# Patient Record
Sex: Female | Born: 1950 | Race: White | Hispanic: No | Marital: Married | Smoking: Current some day smoker
Health system: Southern US, Community
[De-identification: ages and names within clinical notes are randomized; demographics above are authoritative.]

## PROBLEM LIST (undated history)

## (undated) DIAGNOSIS — K589 Irritable bowel syndrome without diarrhea: Secondary | ICD-10-CM

## (undated) DIAGNOSIS — M545 Low back pain, unspecified: Secondary | ICD-10-CM

## (undated) DIAGNOSIS — N3944 Nocturnal enuresis: Secondary | ICD-10-CM

## (undated) DIAGNOSIS — IMO0002 Reserved for concepts with insufficient information to code with codable children: Secondary | ICD-10-CM

## (undated) DIAGNOSIS — R7303 Prediabetes: Secondary | ICD-10-CM

## (undated) DIAGNOSIS — N35919 Unspecified urethral stricture, male, unspecified site: Secondary | ICD-10-CM

## (undated) DIAGNOSIS — C4490 Unspecified malignant neoplasm of skin, unspecified: Secondary | ICD-10-CM

## (undated) DIAGNOSIS — R102 Pelvic and perineal pain: Secondary | ICD-10-CM

## (undated) DIAGNOSIS — R351 Nocturia: Secondary | ICD-10-CM

## (undated) DIAGNOSIS — N200 Calculus of kidney: Secondary | ICD-10-CM

## (undated) DIAGNOSIS — N3281 Overactive bladder: Secondary | ICD-10-CM

## (undated) DIAGNOSIS — N952 Postmenopausal atrophic vaginitis: Secondary | ICD-10-CM

## (undated) DIAGNOSIS — N9089 Other specified noninflammatory disorders of vulva and perineum: Secondary | ICD-10-CM

## (undated) DIAGNOSIS — Z87442 Personal history of urinary calculi: Secondary | ICD-10-CM

## (undated) DIAGNOSIS — R350 Frequency of micturition: Secondary | ICD-10-CM

## (undated) HISTORY — DX: Nocturnal enuresis: N39.44

## (undated) HISTORY — DX: Low back pain: M54.5

## (undated) HISTORY — DX: Reserved for concepts with insufficient information to code with codable children: IMO0002

## (undated) HISTORY — DX: Other specified noninflammatory disorders of vulva and perineum: N90.89

## (undated) HISTORY — DX: Unspecified malignant neoplasm of skin, unspecified: C44.90

## (undated) HISTORY — DX: Nocturia: R35.1

## (undated) HISTORY — DX: Irritable bowel syndrome, unspecified: K58.9

## (undated) HISTORY — PX: SKIN CANCER EXCISION: SHX779

## (undated) HISTORY — DX: Overactive bladder: N32.81

## (undated) HISTORY — DX: Postmenopausal atrophic vaginitis: N95.2

## (undated) HISTORY — DX: Calculus of kidney: N20.0

## (undated) HISTORY — PX: COLONOSCOPY: SHX174

## (undated) HISTORY — DX: Low back pain, unspecified: M54.50

## (undated) HISTORY — DX: Unspecified urethral stricture, male, unspecified site: N35.919

## (undated) HISTORY — DX: Frequency of micturition: R35.0

## (undated) HISTORY — DX: Pelvic and perineal pain: R10.2

---

## 2005-08-01 ENCOUNTER — Ambulatory Visit: Payer: Self-pay

## 2005-10-04 ENCOUNTER — Ambulatory Visit: Payer: Self-pay | Admitting: Family Medicine

## 2006-10-12 ENCOUNTER — Ambulatory Visit: Payer: Self-pay | Admitting: Family Medicine

## 2007-10-15 ENCOUNTER — Ambulatory Visit: Payer: Self-pay | Admitting: Family Medicine

## 2008-01-21 ENCOUNTER — Ambulatory Visit: Payer: Self-pay | Admitting: Unknown Physician Specialty

## 2008-10-23 ENCOUNTER — Ambulatory Visit: Payer: Self-pay | Admitting: Family Medicine

## 2009-12-14 ENCOUNTER — Ambulatory Visit: Payer: Self-pay | Admitting: Family Medicine

## 2010-01-29 ENCOUNTER — Ambulatory Visit: Payer: Self-pay | Admitting: Unknown Physician Specialty

## 2010-01-29 IMAGING — US TRANSABDOMINAL ULTRASOUND OF PELVIS
1 series · 17 of 25 positions shown · non-contrast
Comparison: none

REASON FOR EXAM: IBS
COMMENTS:

[Series 1: transabdominal ultrasound of pelvis · 17 of 25 slices shown]
[im 1/25]
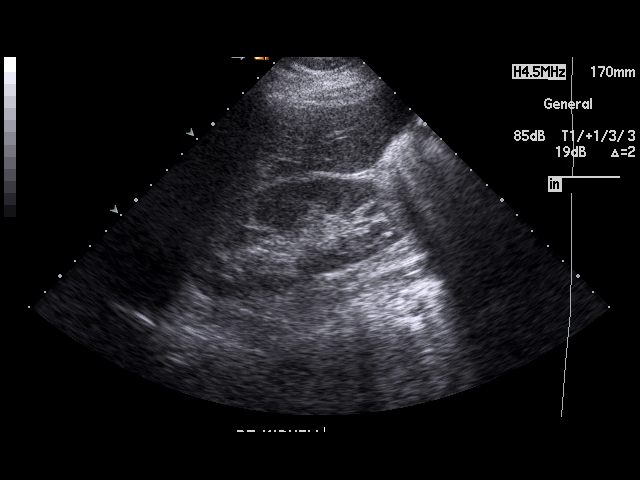
[im 3/25]
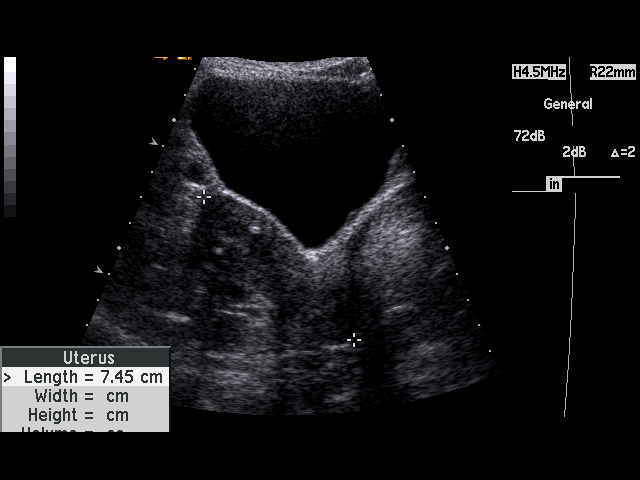
[im 4/25]
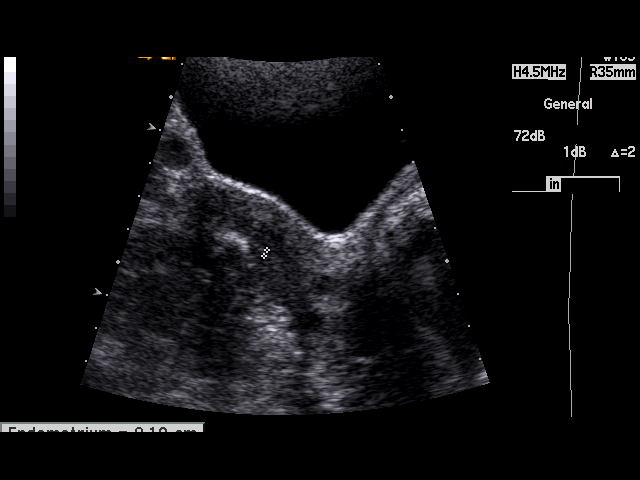
[im 6/25]
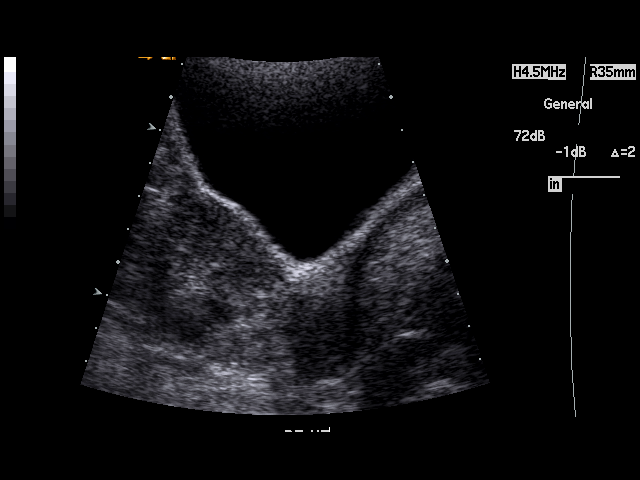
[im 7/25]
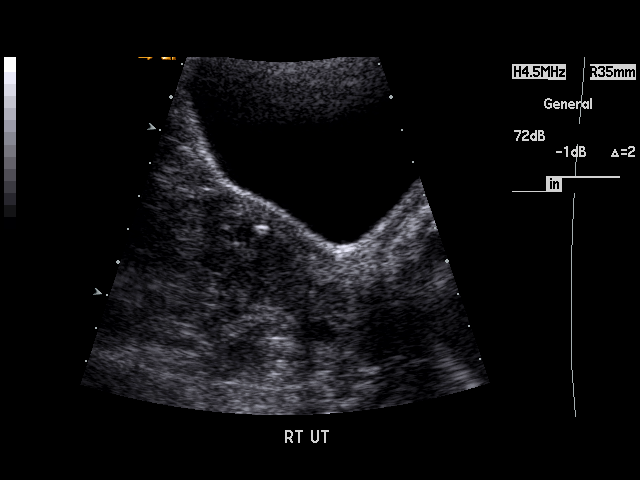
[im 9/25]
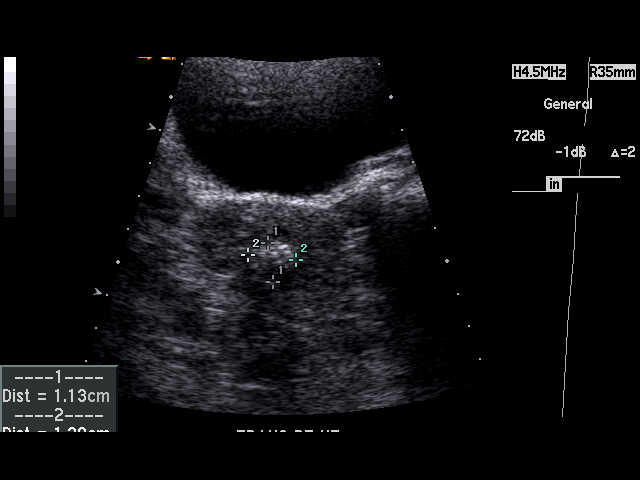
[im 10/25]
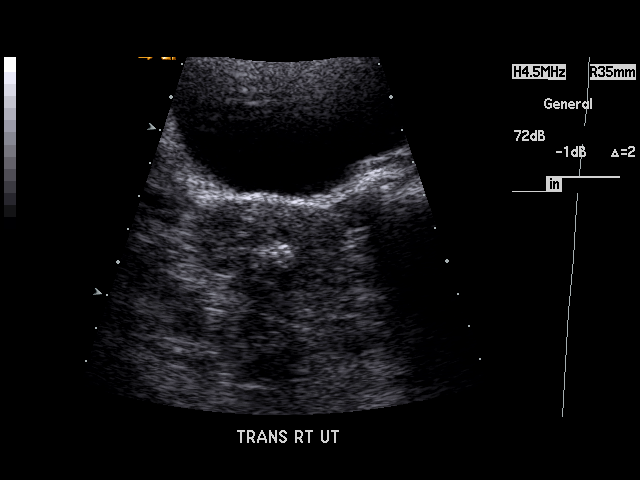
[im 12/25]
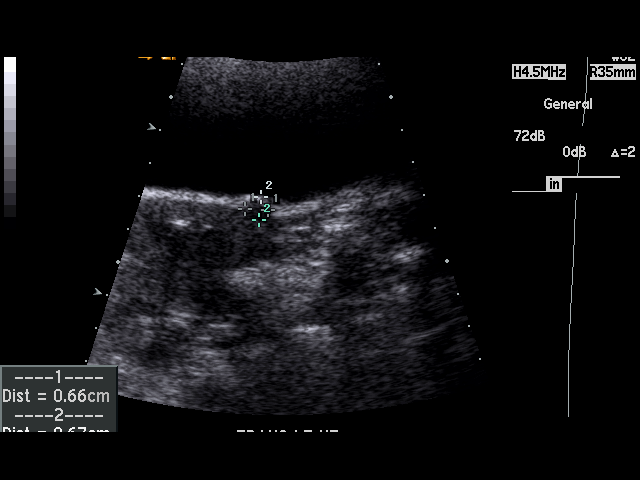
[im 13/25]
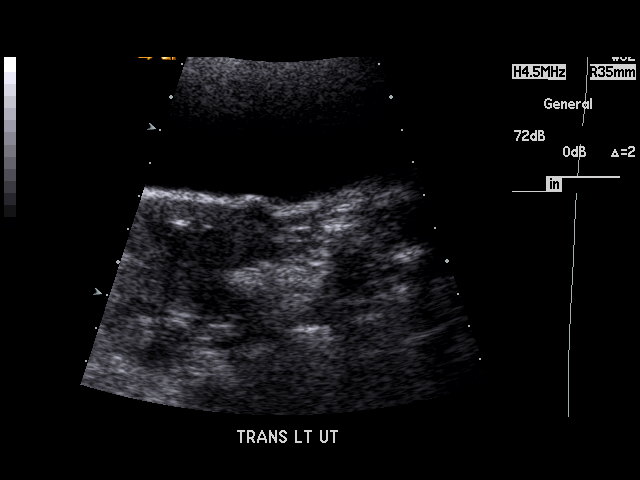
[im 14/25]
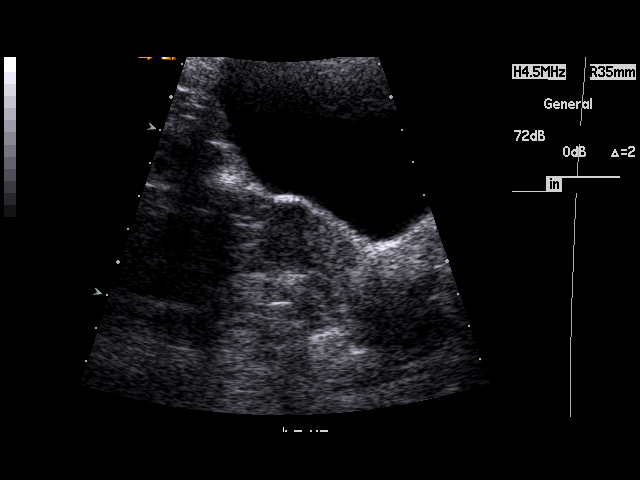
[im 16/25]
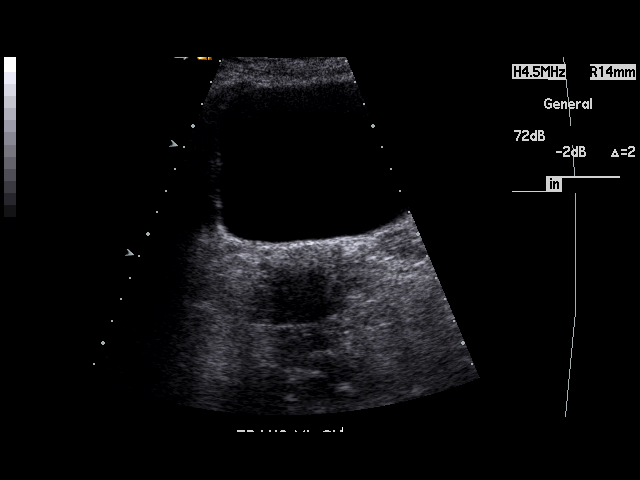
[im 17/25]
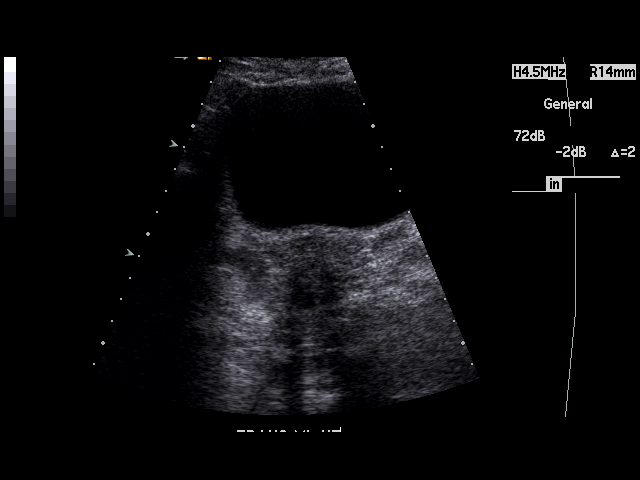
[im 19/25]
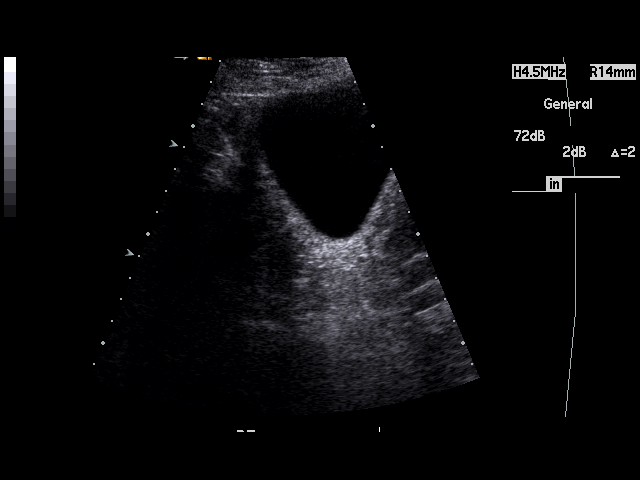
[im 20/25]
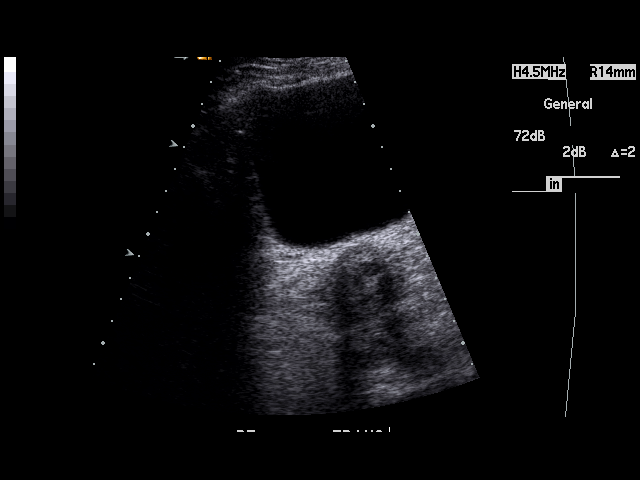
[im 22/25]
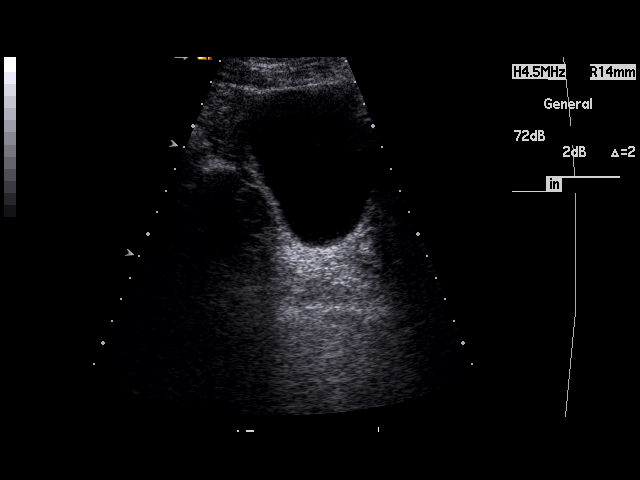
[im 23/25]
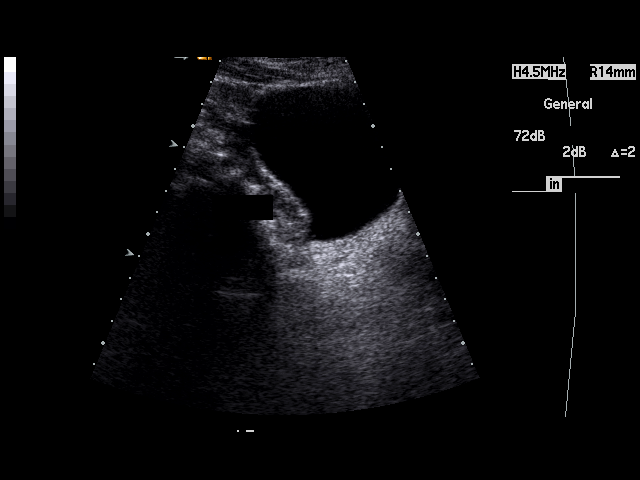
[im 25/25]
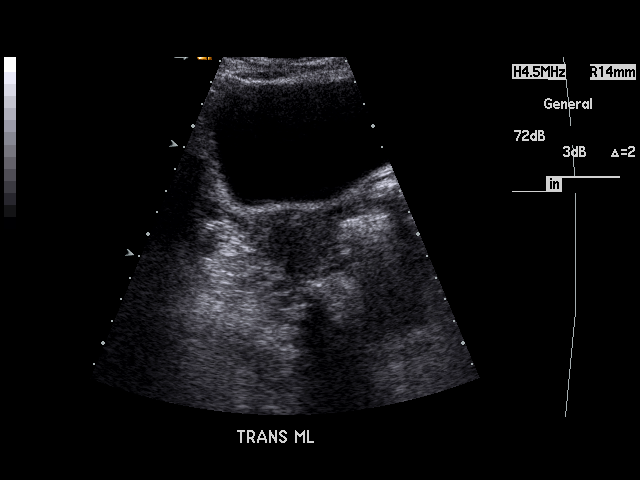

[17 of 25 positions shown; findings below may reference images not displayed]

PROCEDURE:     FRANCISCO J - FRANCISCO J PELVIS NON-OB  - [DATE] [DATE]

RESULT:     Transabdominal Pelvic Ultrasound was performed. The uterus
measures 7.45 cm x 4.21 cm x 2.91 cm. The endometrium measures 1.9 mm in
thickness. There are noted two, small uterine masses consistent with uterine
fibroids containing calcification. The larger is located posteriorly in the
fundal area and measures 1.39 cm at maximum diameter. The smaller is
anteriorly located in the fundus and measures 8.8 mm at maximum diameter.
The right and left ovaries are not seen and apparently are atrophic or are
obscured. No abnormal adnexal masses are seen. There is no free fluid noted
in the pelvis. The kidneys show no hydronephrosis. There is no ascites.
IMPRESSION: 1.  There are noted two, calcified uterine fibroids. These were also present
on a prior exam in [F8]. The prior exam is not available for review but
based on the report, the fibroids apparently are now smaller.
2.  No thickening of the uterine endometrium is seen.
3.  No abnormal adnexal masses are noted.
4.  Neither ovary is identified sonographically.
5.  There is no ascites.

## 2011-05-05 ENCOUNTER — Ambulatory Visit: Payer: Self-pay | Admitting: Family Medicine

## 2012-05-08 ENCOUNTER — Ambulatory Visit: Payer: Self-pay | Admitting: Family Medicine

## 2012-05-08 IMAGING — MG MM DIGITAL SCREENING BILAT W/ CAD
1 series · 4 of 4 positions shown · non-contrast
Comparison: none

REASON FOR EXAM: SCR MAMMO NO ORDER
COMMENTS:

[R CC · right · 4 of 4 slices shown]
[im 1/4]
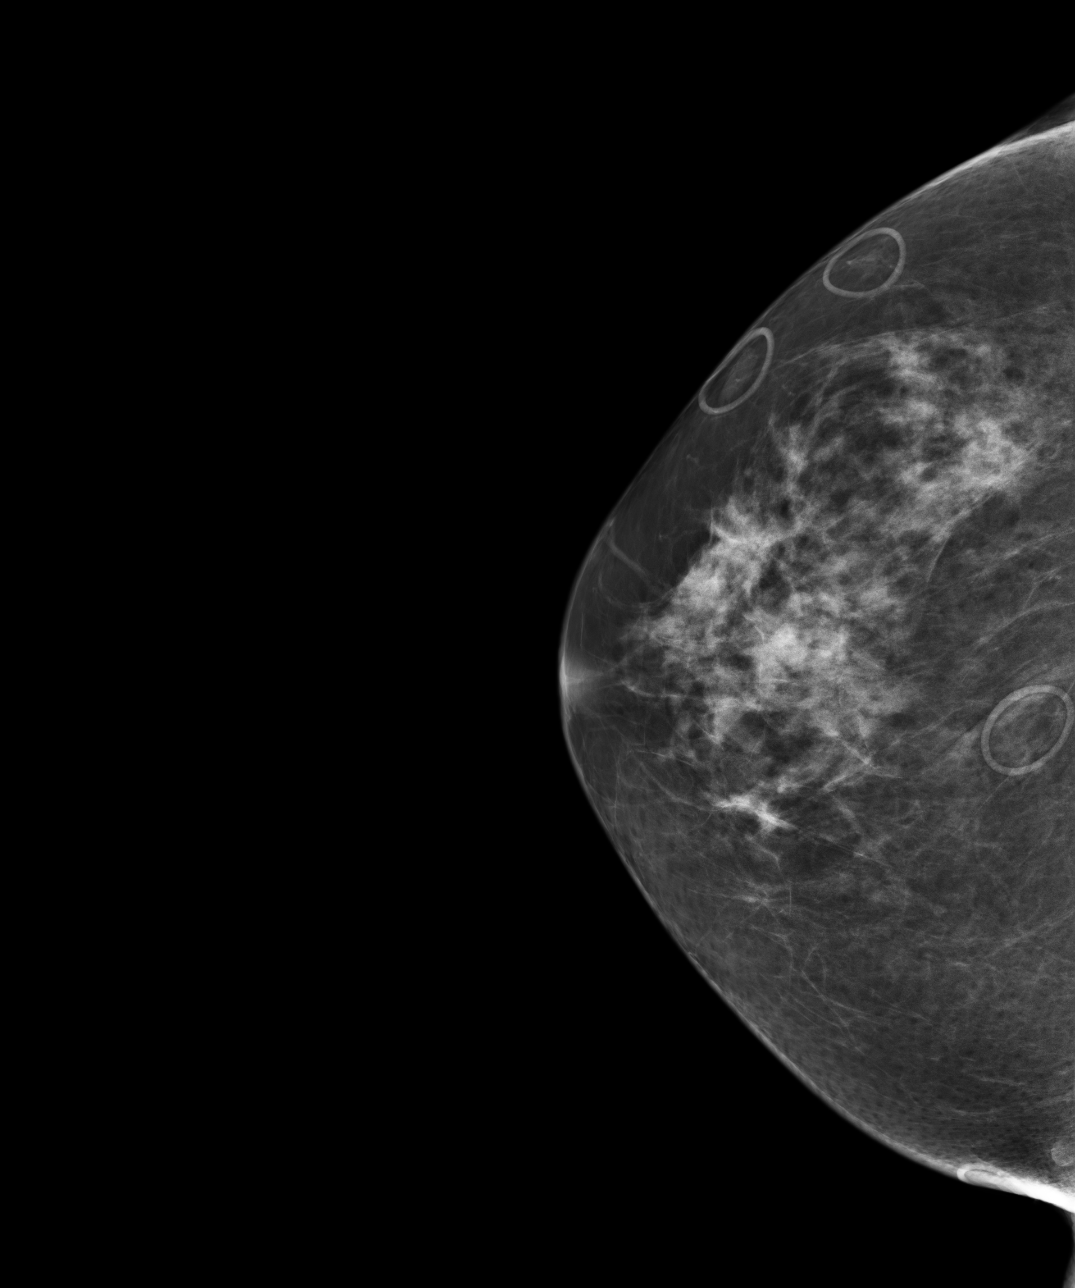
[im 2/4]
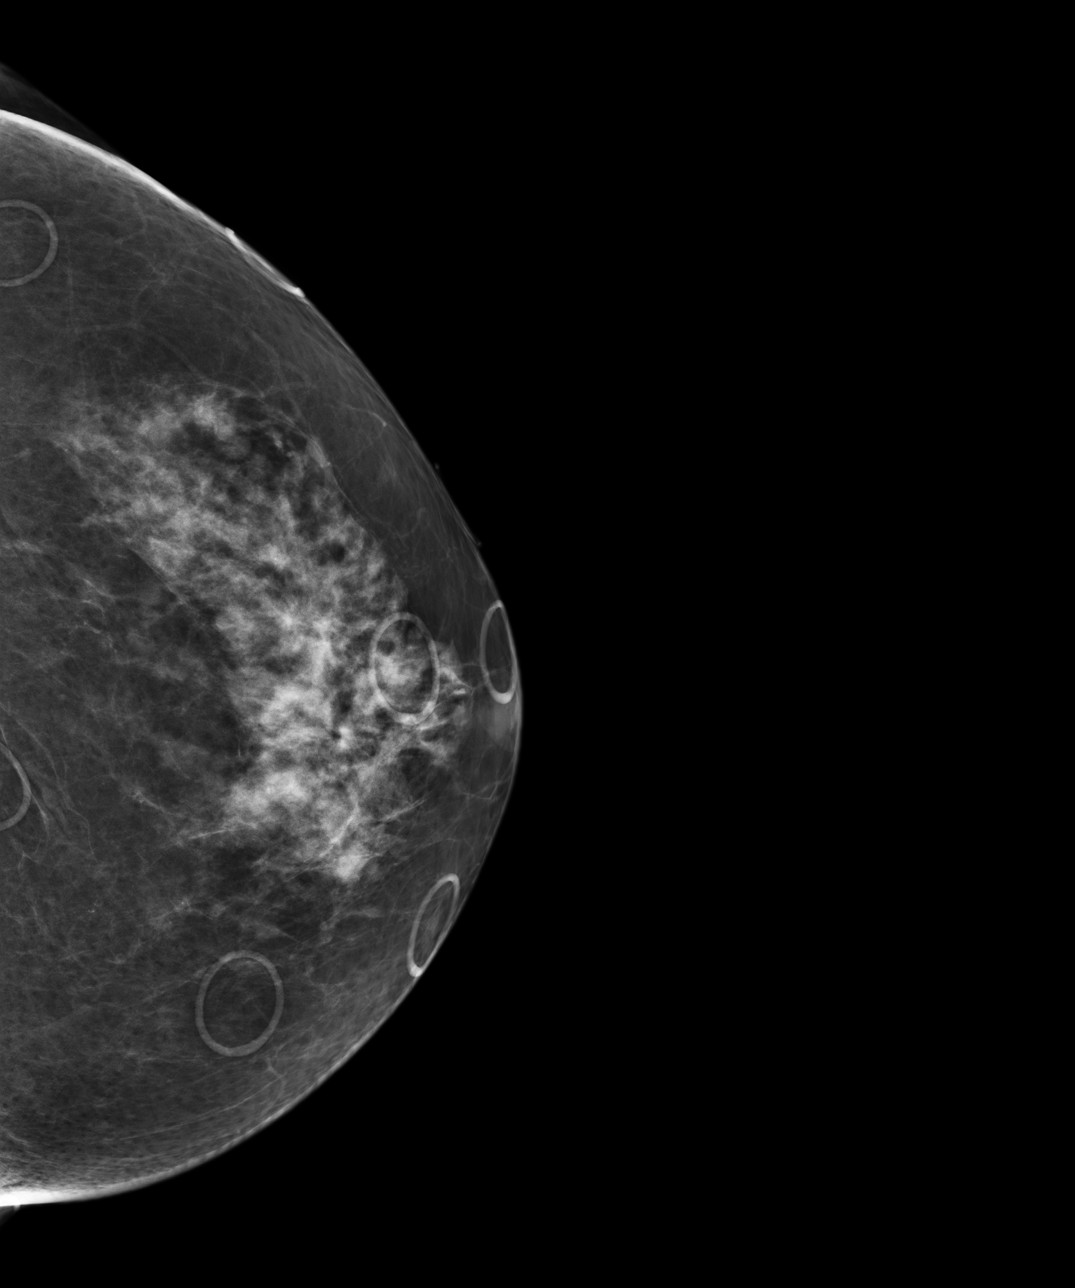
[im 3/4]
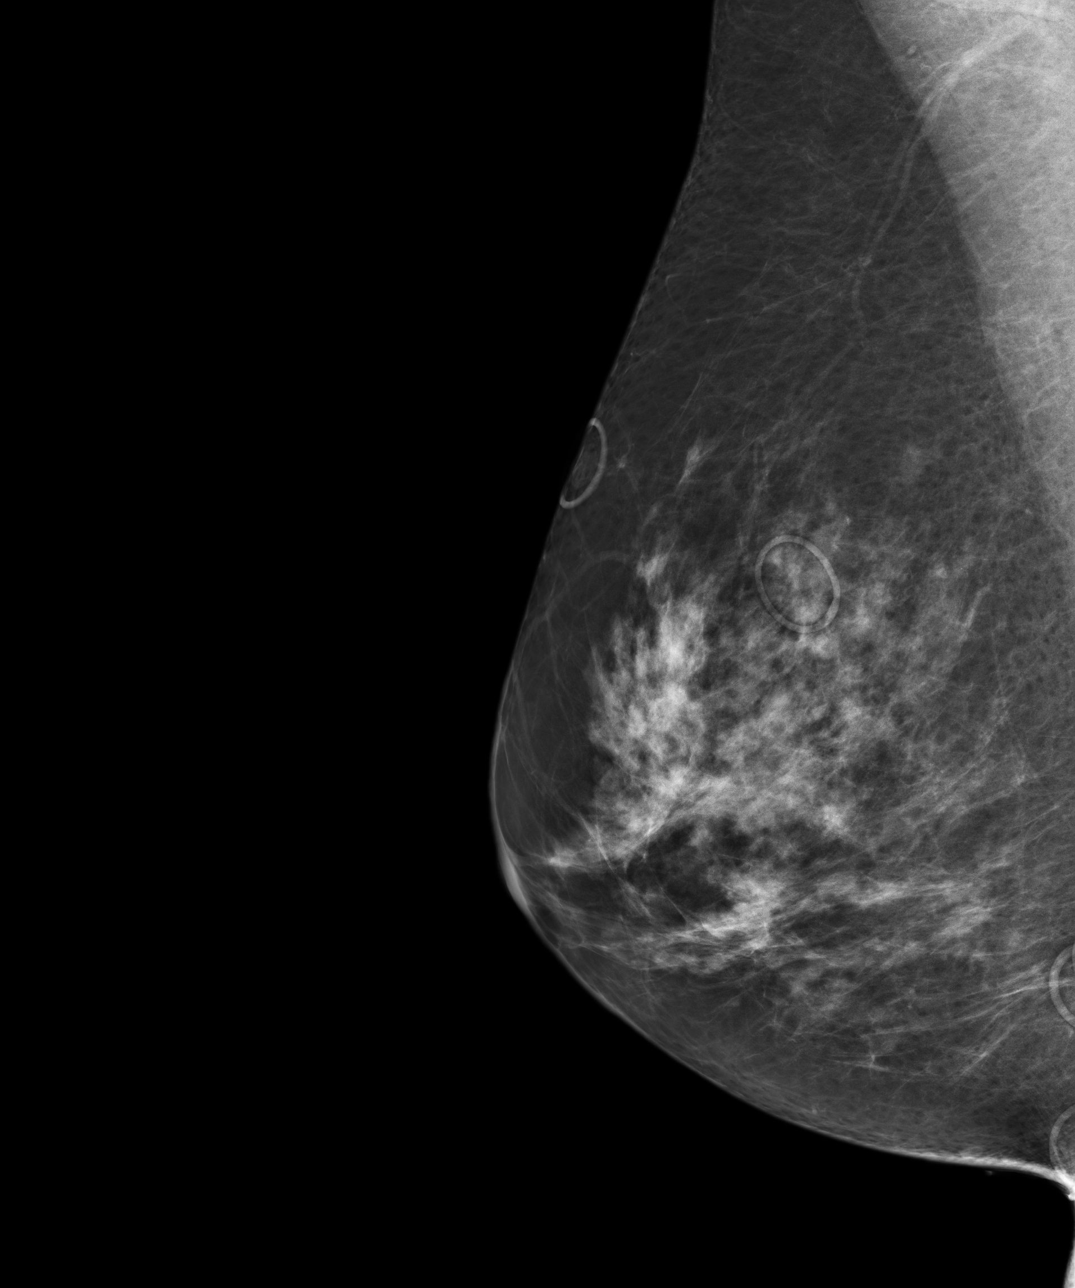
[im 4/4]
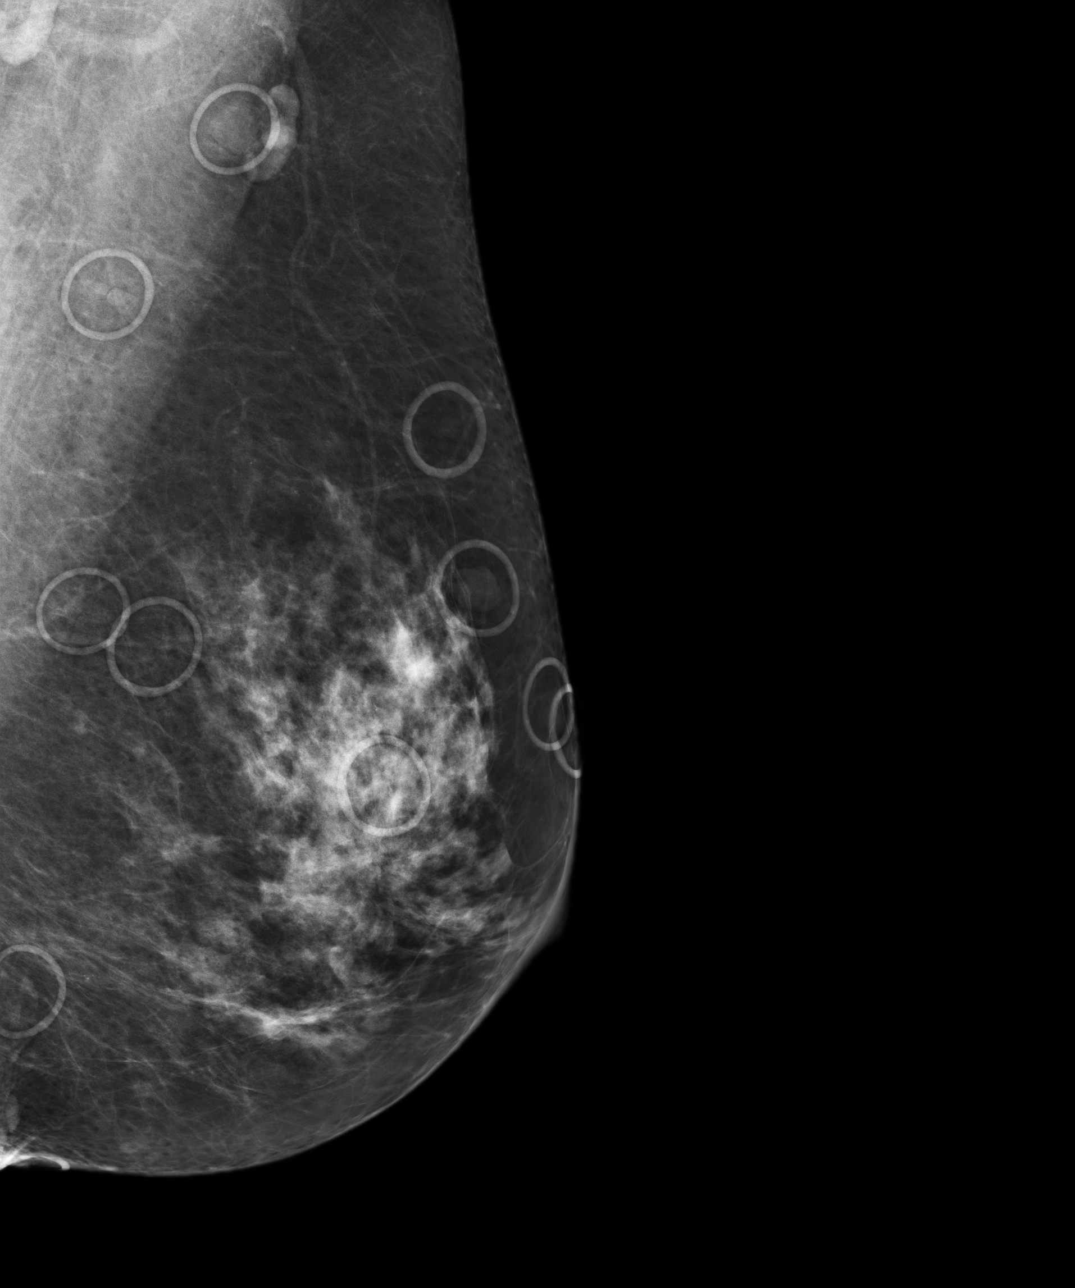

[4 of 4 positions shown; findings below may reference images not displayed]

PROCEDURE:     MMM - MMM DGT SCR NO ORDER W/CAD  - [DATE]  [DATE]

RESULT:     Comparison is made to previous digital studies [DATE],[DATE],[DATE], and [DATE].

The breasts exhibit a scattered fibroglandular pattern. Numerous skin nevi
were marked bilaterally. There is no dominant mass. There are no malignant
appearing groupings of microcalcification. No areas of new architectural
distortion are demonstrated.
IMPRESSION: There are no findings suspicious for malignancy.

BI-RADS 2: Benign findings.

Recommendation: Please continue to encourage yearly mammographic followup.

BREAST COMPOSITION: The breast composition is SCATTERED FIBROGLANDULAR
TISSUE (glandular tissue is 25-50%)

A NEGATIVE MAMMOGRAM REPORT DOES NOT PRECLUDE BIOPSY OR OTHER EVALUATION OF
A CLINICALLY PALPABLE OR OTHERWISE SUSPICIOUS MASS OR LESION. BREAST CANCER
MAY NOT BE DETECTED BY MAMMOGRAPHY IN UP TO 10% OF CASES.

[REDACTED]

## 2013-02-14 ENCOUNTER — Ambulatory Visit: Payer: Self-pay | Admitting: Unknown Physician Specialty

## 2013-05-09 ENCOUNTER — Ambulatory Visit: Payer: Self-pay | Admitting: Family Medicine

## 2013-05-09 IMAGING — MG MM DIGITAL SCREENING BILAT W/ CAD
1 series · 4 of 4 positions shown · non-contrast
Comparison: Previous exam(s).

CLINICAL DATA: Screening.

EXAM:
DIGITAL SCREENING BILATERAL MAMMOGRAM WITH CAD

[R CC · right · 4 of 4 slices shown]
[im 1/4]
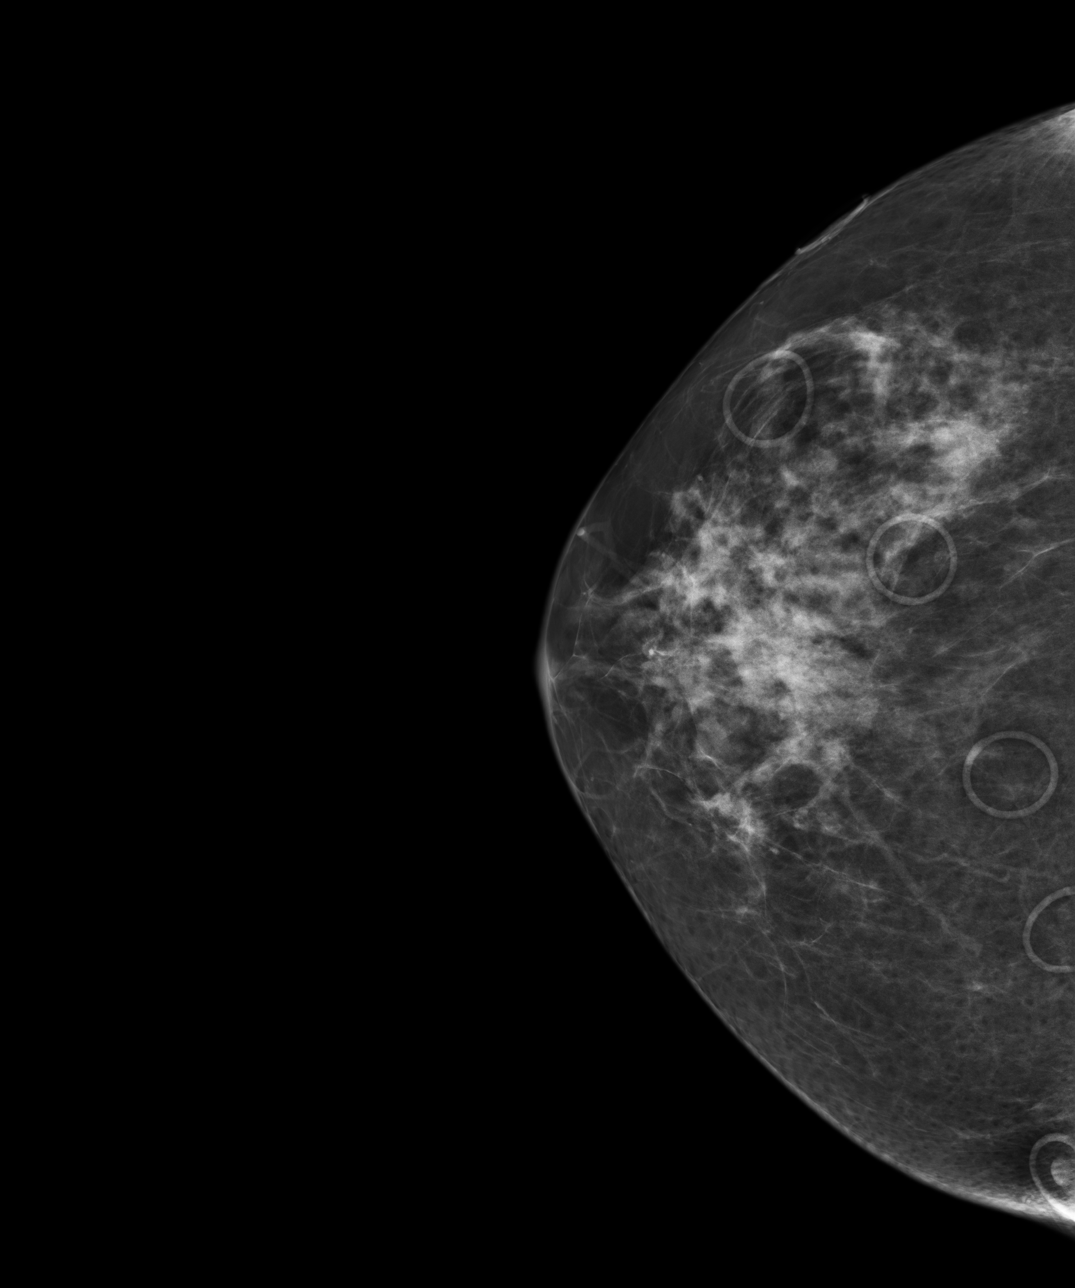
[im 2/4]
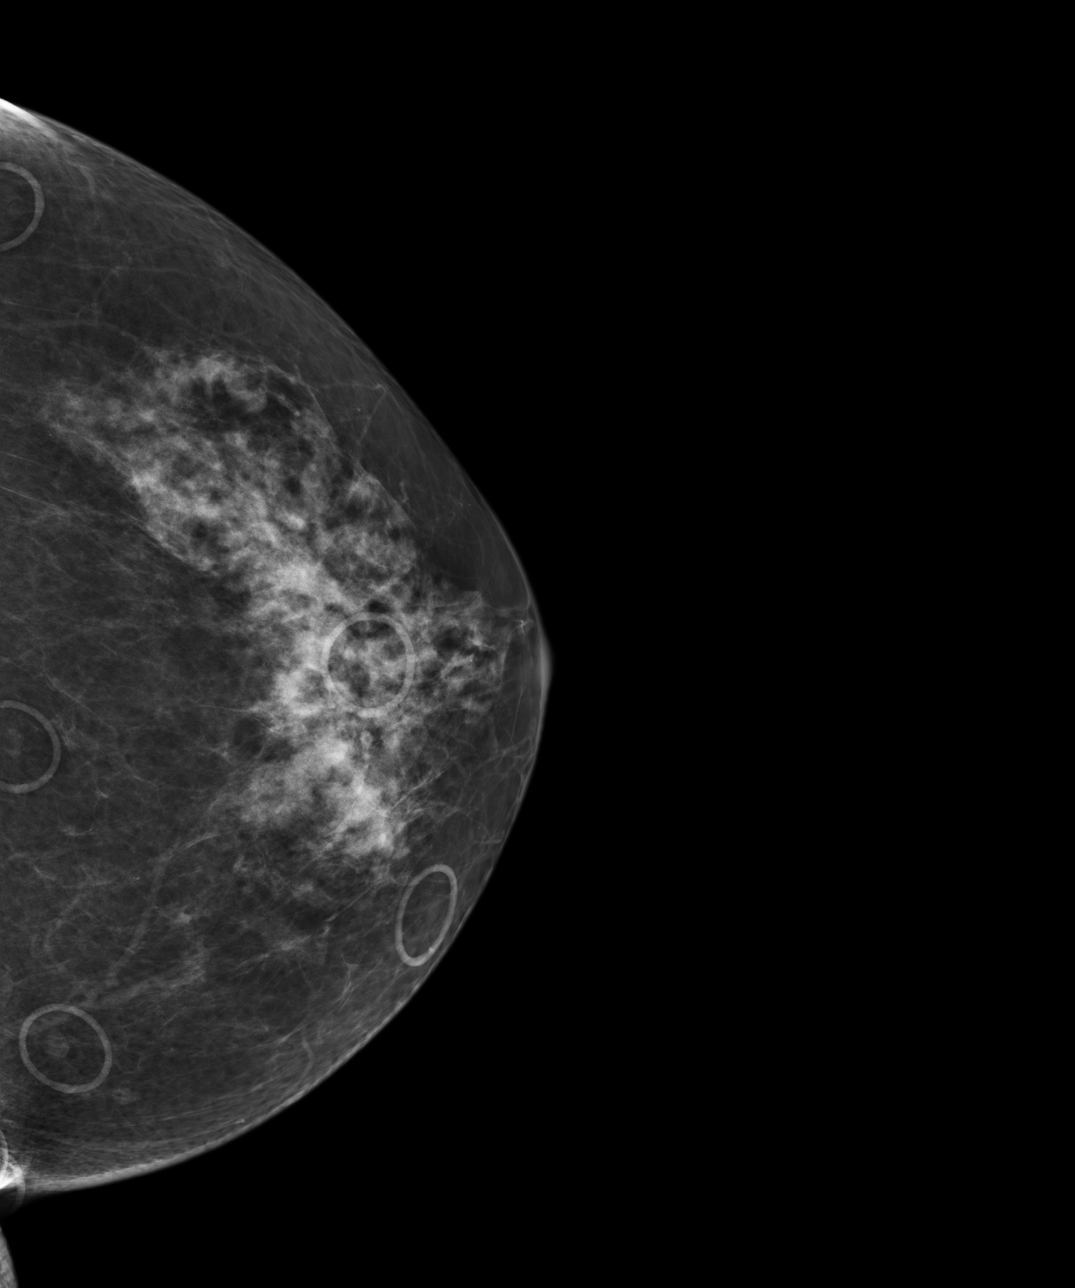
[im 3/4]
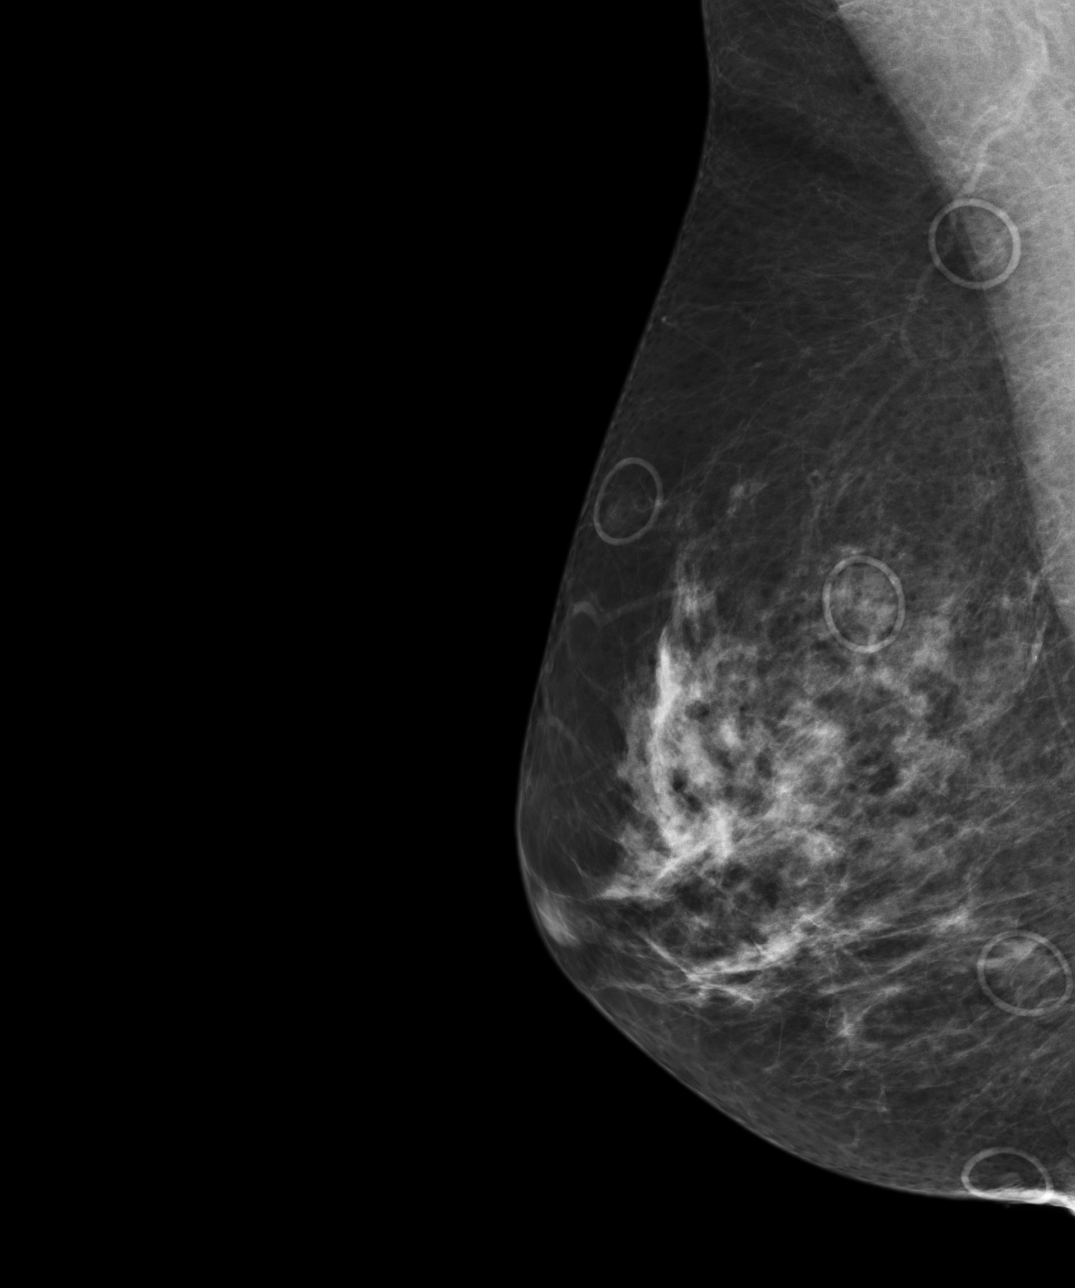
[im 4/4]
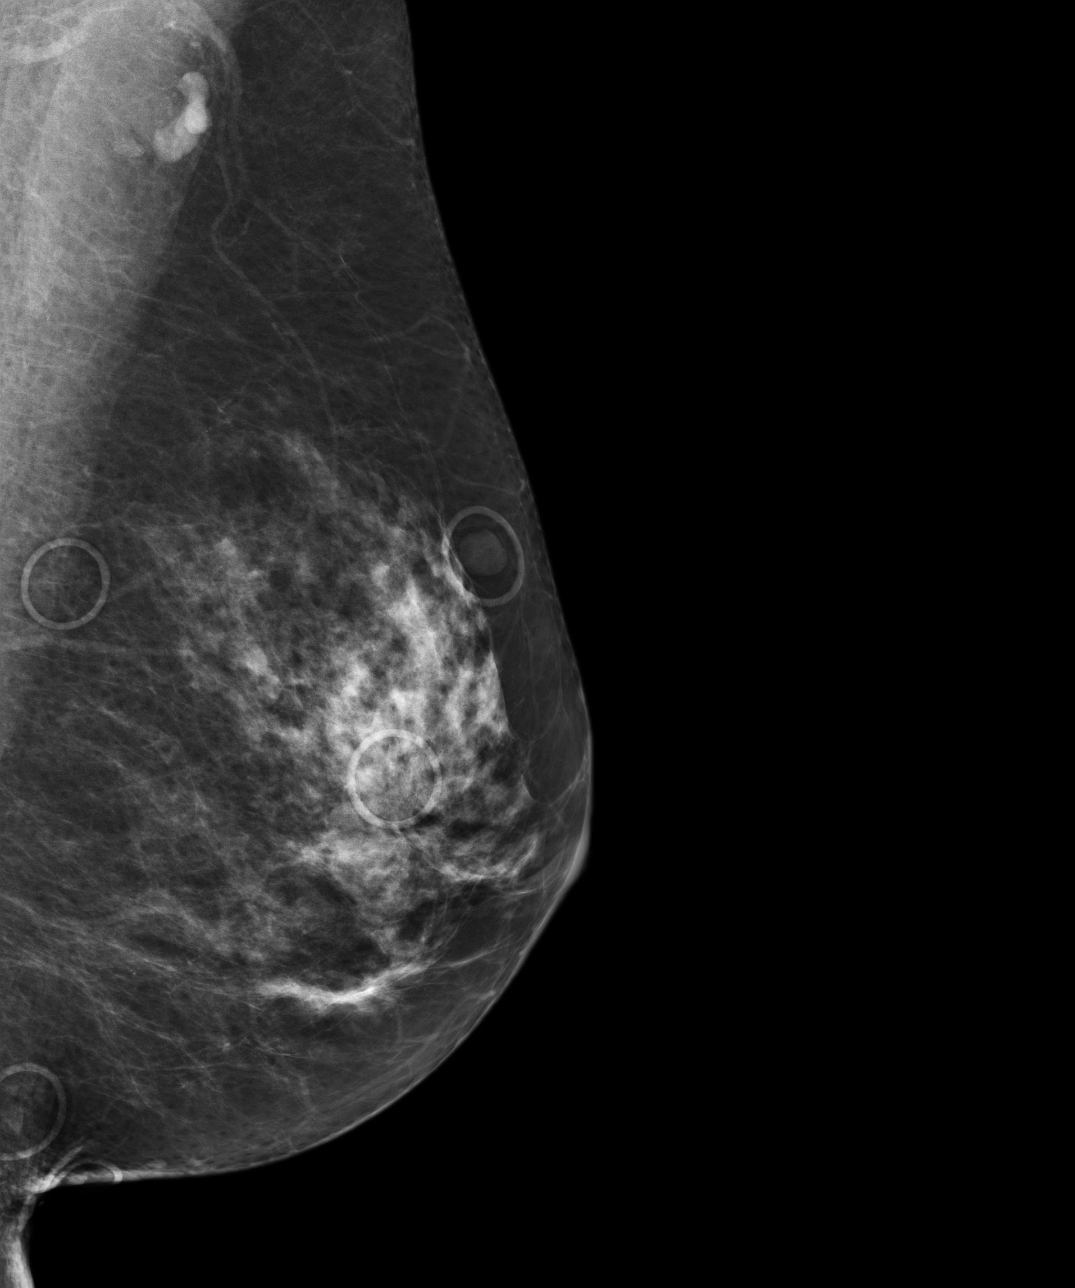

[4 of 4 positions shown; findings below may reference images not displayed]

ACR Breast Density Category c: The breast tissue is heterogeneously
dense, which may obscure small masses.
FINDINGS: There are no findings suspicious for malignancy. Images were
processed with CAD.
IMPRESSION: No mammographic evidence of malignancy. A result letter of this
screening mammogram will be mailed directly to the patient.

RECOMMENDATION:
Screening mammogram in one year. (Code:[0J])

BI-RADS CATEGORY  1: Negative.

## 2013-11-26 ENCOUNTER — Ambulatory Visit: Payer: Self-pay | Admitting: Emergency Medicine

## 2013-11-26 LAB — URINALYSIS, COMPLETE
Bilirubin,UR: NEGATIVE
GLUCOSE, UR: NEGATIVE
KETONE: NEGATIVE
Nitrite: NEGATIVE
Ph: 6 (ref 5.0–8.0)
Protein: NEGATIVE
Specific Gravity: >=1.03 (ref 1.000–1.030)

## 2013-11-28 LAB — URINE CULTURE

## 2013-12-04 DIAGNOSIS — E119 Type 2 diabetes mellitus without complications: Secondary | ICD-10-CM | POA: Insufficient documentation

## 2013-12-04 DIAGNOSIS — I10 Essential (primary) hypertension: Secondary | ICD-10-CM | POA: Insufficient documentation

## 2013-12-04 DIAGNOSIS — K589 Irritable bowel syndrome without diarrhea: Secondary | ICD-10-CM | POA: Insufficient documentation

## 2014-05-27 ENCOUNTER — Ambulatory Visit: Payer: Self-pay | Admitting: Family Medicine

## 2014-05-27 IMAGING — MG MM DIGITAL SCREENING BILAT W/ CAD
1 series · 4 of 4 positions shown · non-contrast
Comparison: Previous exam(s).

CLINICAL DATA: Screening.

EXAM:
DIGITAL SCREENING BILATERAL MAMMOGRAM WITH CAD

[R CC · right · 4 of 4 slices shown]
[im 1/4]
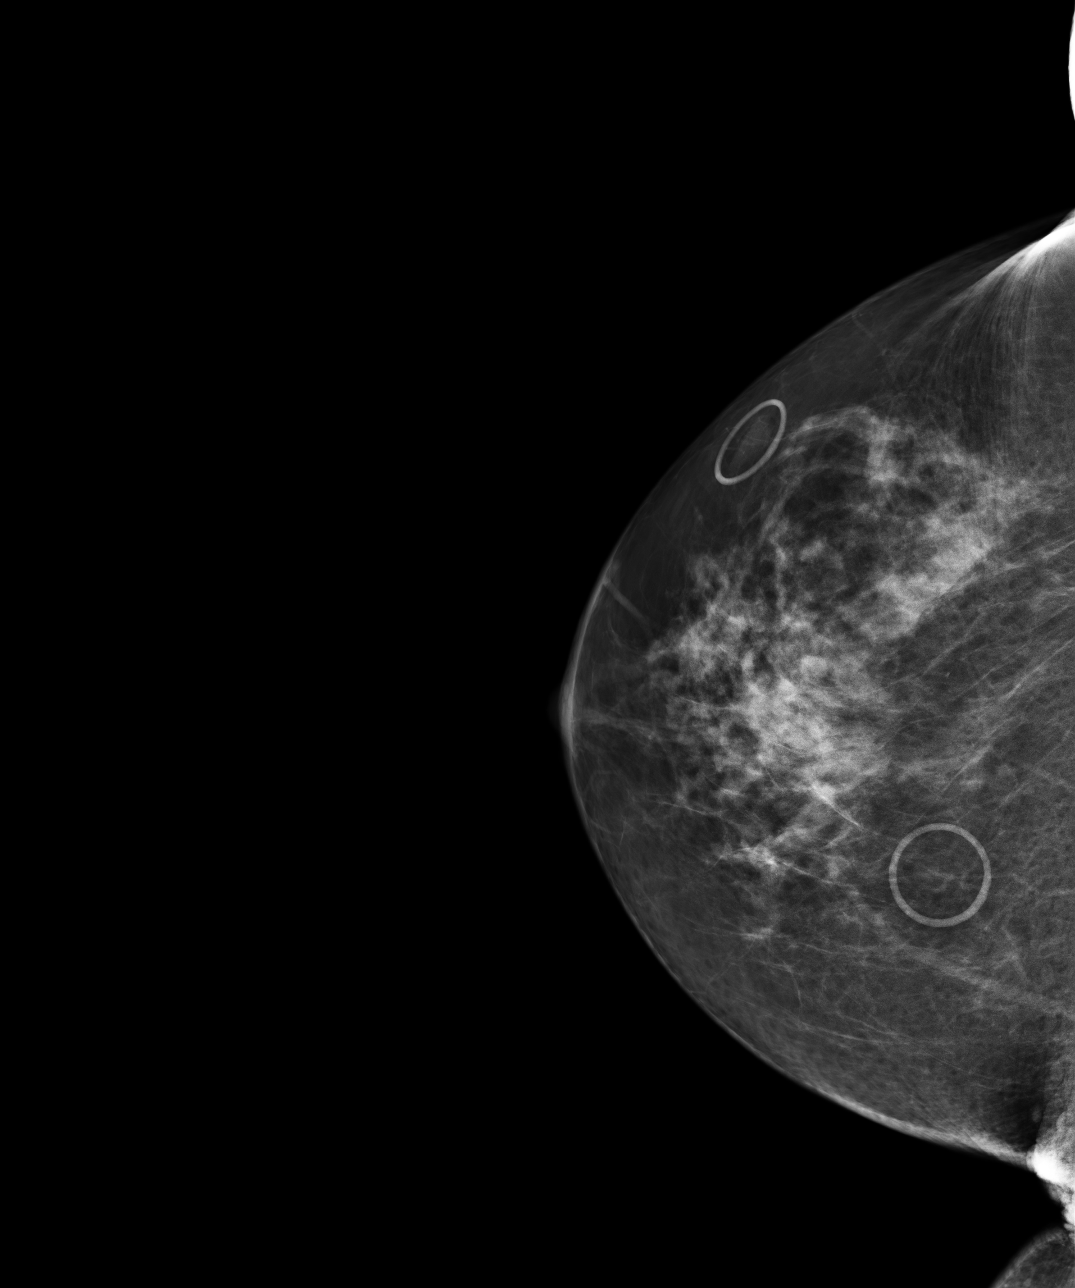
[im 2/4]
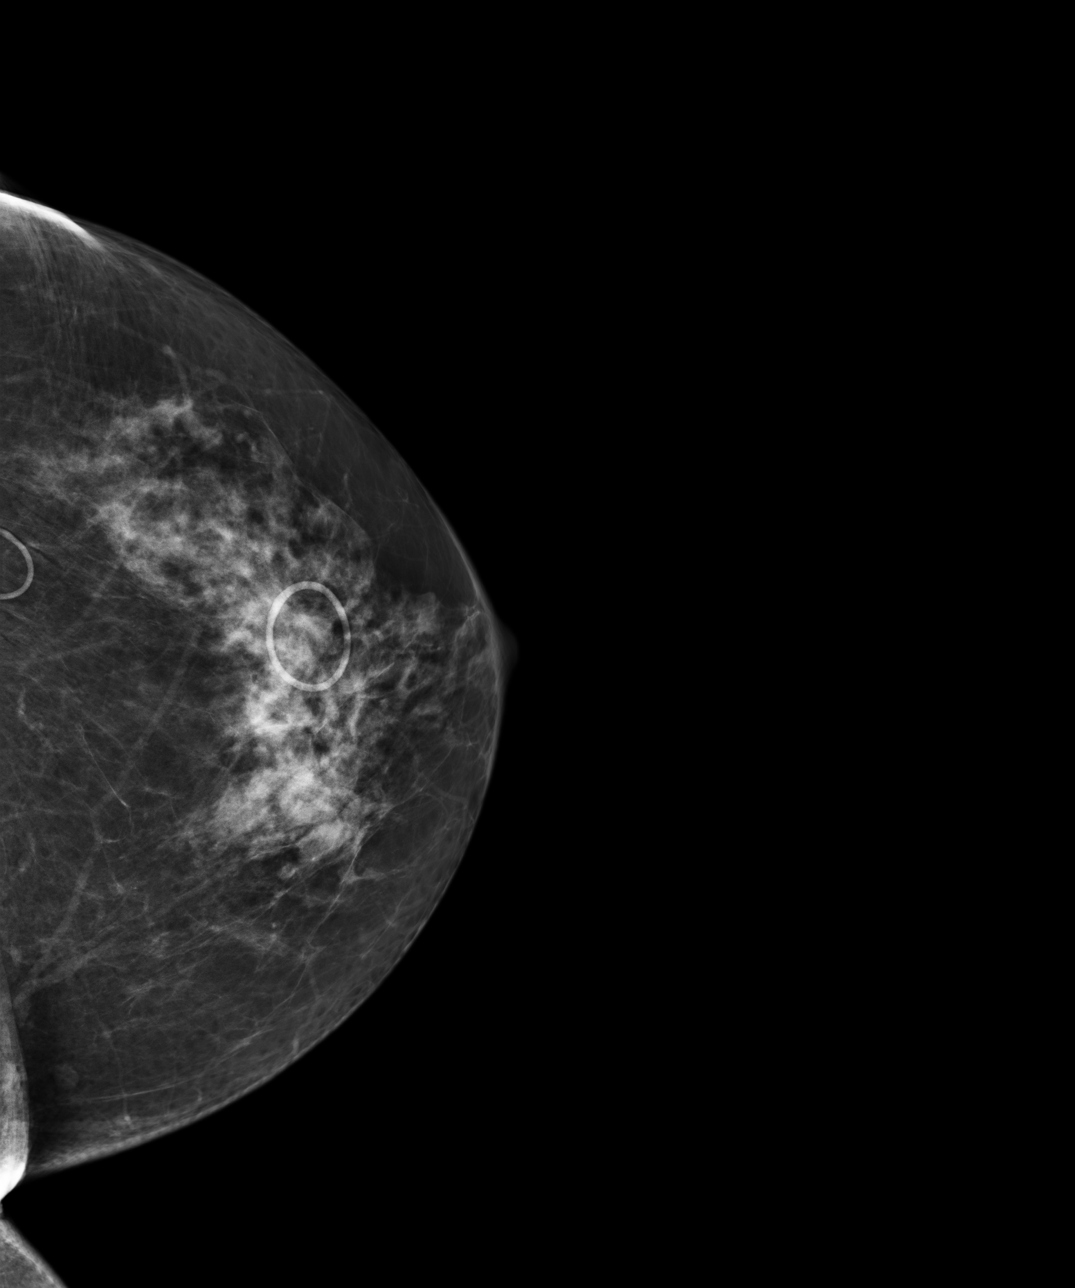
[im 3/4]
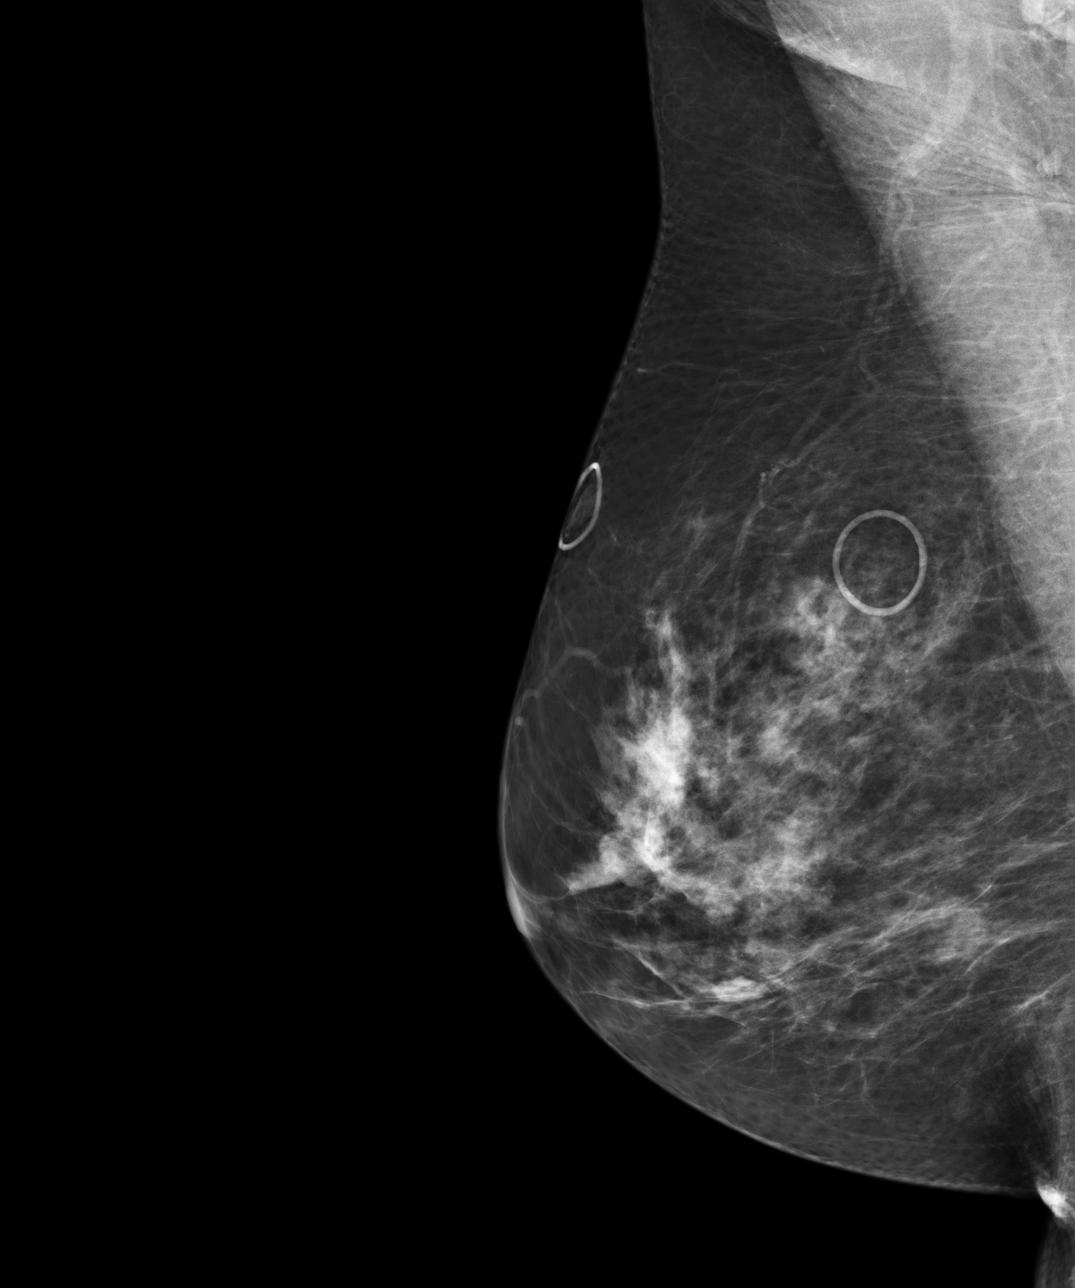
[im 4/4]
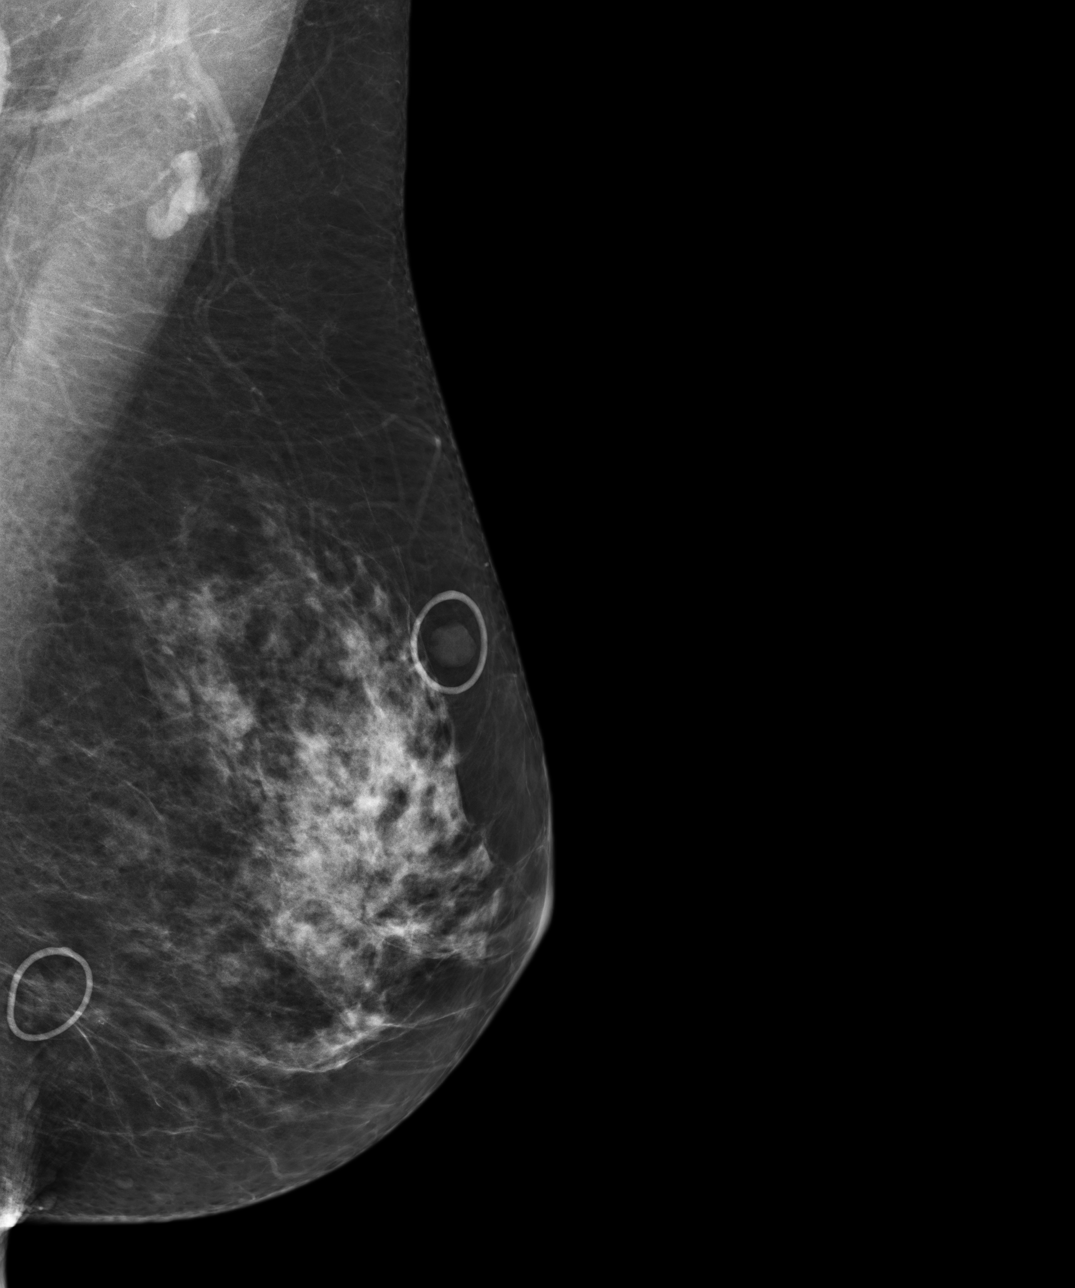

[4 of 4 positions shown; findings below may reference images not displayed]

ACR Breast Density Category c: The breast tissue is heterogeneously
dense, which may obscure small masses.
FINDINGS: There are no findings suspicious for malignancy. Images were
processed with CAD.
IMPRESSION: No mammographic evidence of malignancy. A result letter of this
screening mammogram will be mailed directly to the patient.

RECOMMENDATION:
Screening mammogram in one year. (Code:[0J])

BI-RADS CATEGORY  1: Negative.

## 2014-10-27 ENCOUNTER — Emergency Department
Admission: EM | Admit: 2014-10-27 | Discharge: 2014-10-27 | Disposition: A | Discharge status: Home / Self Care | Payer: Medicare Other | Attending: Emergency Medicine | Admitting: Emergency Medicine

## 2014-10-27 ENCOUNTER — Emergency Department: Payer: Medicare Other

## 2014-10-27 DIAGNOSIS — M545 Low back pain, unspecified: Secondary | ICD-10-CM

## 2014-10-27 LAB — URINALYSIS COMPLETE WITH MICROSCOPIC (ARMC ONLY)
BILIRUBIN URINE: NEGATIVE
Bacteria, UA: NONE SEEN
GLUCOSE, UA: NEGATIVE mg/dL
Ketones, ur: NEGATIVE mg/dL
Leukocytes, UA: NEGATIVE
Nitrite: NEGATIVE
Protein, ur: NEGATIVE mg/dL
SPECIFIC GRAVITY, URINE: 1.01 (ref 1.005–1.030)
pH: 6 (ref 5.0–8.0)

## 2014-10-27 IMAGING — CR DG LUMBAR SPINE 2-3V
3 series · 3 of 3 positions shown · non-contrast
Comparison: None.

CLINICAL DATA: Low back pain 3 weeks radiating to lower
extremities. No injury.

EXAM:
LUMBAR SPINE - 2-3 VIEW

[l-spine ap]
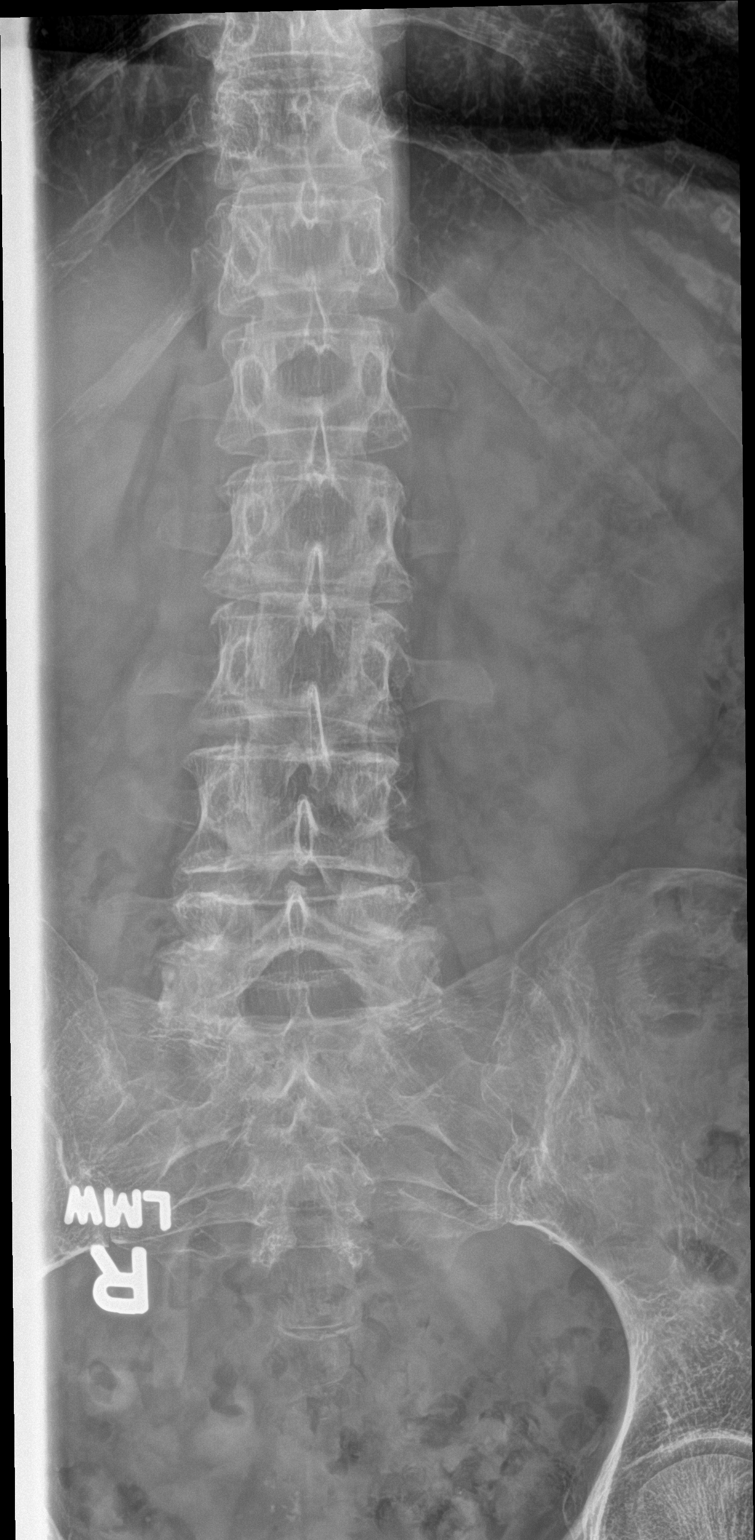

[l-spine lat]
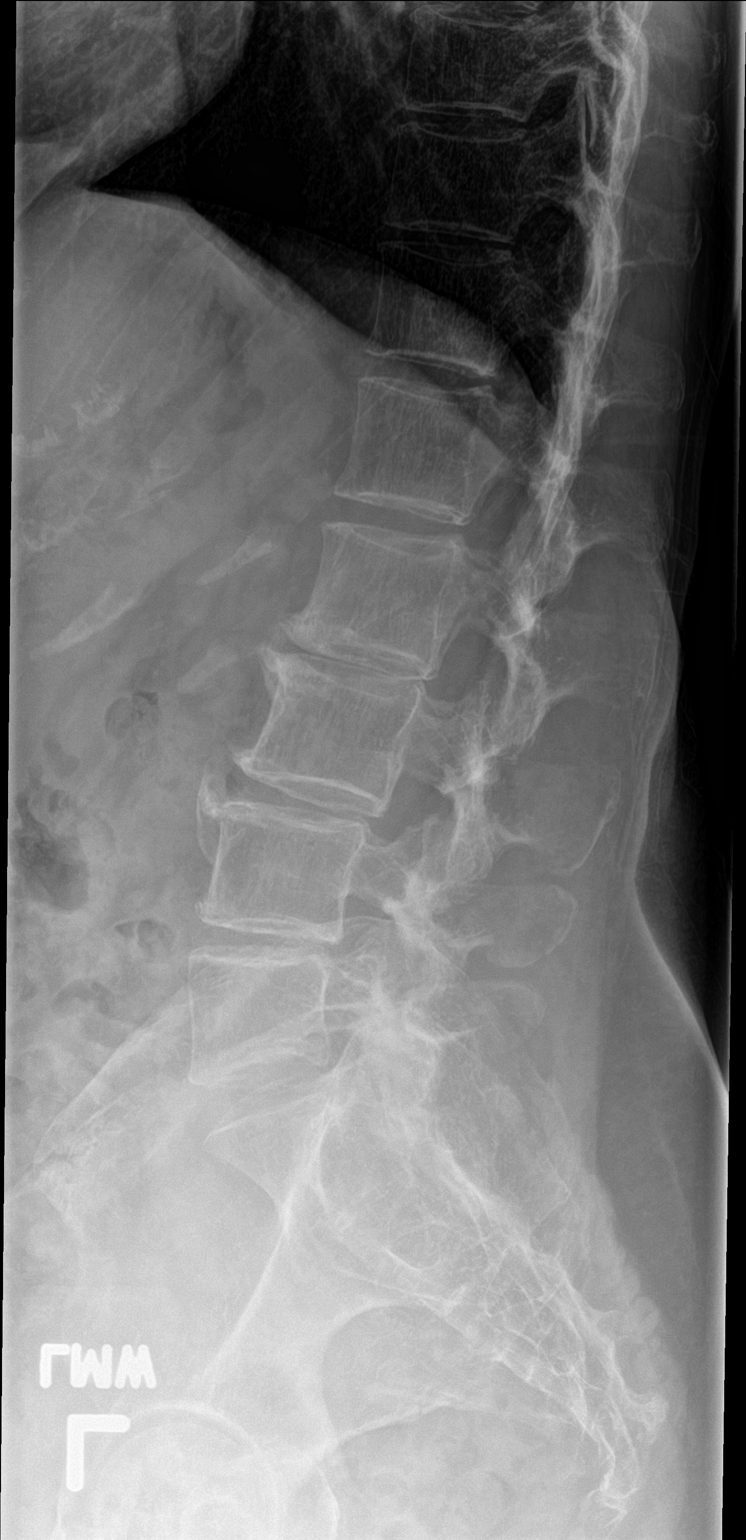

[l-spine spot]
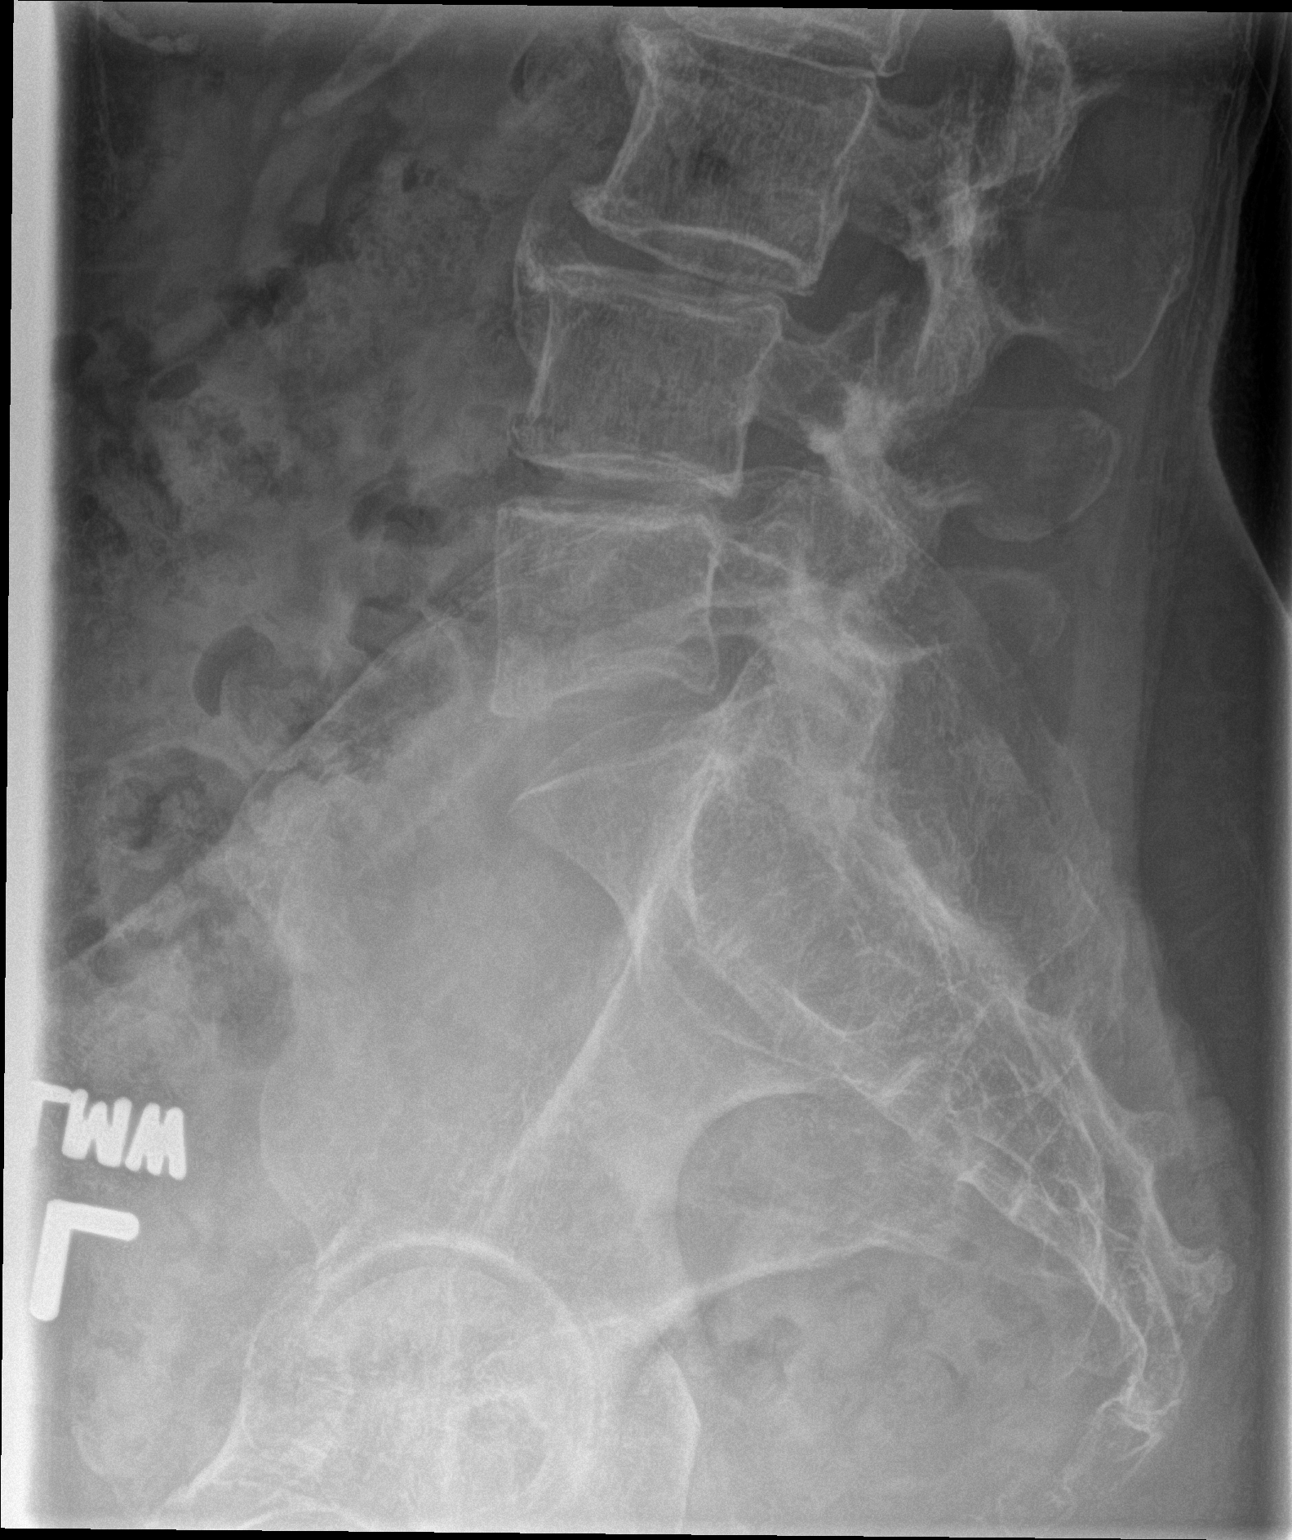

[3 of 3 positions shown; findings below may reference images not displayed]

FINDINGS: There is diffuse decreased bone mineralization. There is mild
spondylosis throughout the lumbar spine to include facet arthropathy
of the lower lumbar spine. Vertebral body alignment and heights are
within normal. There is mild disc space narrowing at the L2-3, L3-4
and L4-5 levels.
IMPRESSION: Mild spondylosis of the lumbar spine with multilevel disc disease
from the L2-3 level to the L4-5 level. No acute findings.

## 2014-10-27 MED ORDER — HYDROCODONE-ACETAMINOPHEN 5-325 MG PO TABS: 1.0000 | ORAL_TABLET | ORAL | Status: DC | PRN

## 2014-10-27 MED ORDER — HYDROCODONE-ACETAMINOPHEN 5-325 MG PO TABS
1.0000 | ORAL_TABLET | Freq: Once | ORAL | Status: AC
  Administered 2014-10-27: 1 via ORAL
  Filled 2014-10-27: qty 1

## 2014-10-27 NOTE — ED Notes (Signed)
Patient transported to X-ray 

## 2014-10-27 NOTE — ED Notes (Addendum)
Pt reports lower back pain shooting down legs x 3 weeks.  Pt saw PCP a week ago and prescribed meloxicam.  Pt states it improved briefly but has worsened since.  Pt denies injury, denies hx of back pain. Pt reports pain radiates to front abd as well.  Pt NAD at this time.

## 2014-10-27 NOTE — ED Notes (Addendum)
Patient ambulatory to triage with steady gait, without difficulty or distress noted; pt reports lower back pain x 3 weeks radiating into legs at times; PCP rx unknown pain medication but pain persists; denies any known injury; denies hx of same; denies urinary c/o

## 2014-10-27 NOTE — ED Provider Notes (Signed)
[facility] Emergency Department Provider Note  ____________________________________________  Time seen:  8:25 PM  I have reviewed the triage vital signs and the nursing notes.   HISTORY  Chief Complaint Back Pain   HPI Olivia Alvarado is a 64 y.o. female is here with complaint of low back pain radiating into her legs 3 weeks. She saw her PCP week ago and was prescribed meloxicam which she thought was helping briefly but yesterday began with more severe pain. She denies any history of back injury or history of back pain. She denies any symptoms of UTI. She denies any bowel or bladder incontinence. Currently she rates her pain 5 out of 10. Movement and standing makes it worse. Nothing is helping at this point.   No past medical history on file.  There are no active problems to display for this patient.   No past surgical history on file.  Current Outpatient Rx  Name  Route  Sig  Dispense  Refill  . HYDROcodone-acetaminophen (NORCO/VICODIN) 5-325 MG per tablet   Oral   Take 1 tablet by mouth every 4 (four) hours as needed for moderate pain.   20 tablet   0     Allergies Review of patient's allergies indicates not on file.  No family history on file.  Social History History  Substance Use Topics  . Smoking status: Not on file  . Smokeless tobacco: Not on file  . Alcohol Use: Not on file    Review of Systems Constitutional: No fever/chills ENT: No sore throat. Cardiovascular: Denies chest pain. Respiratory: Denies shortness of breath. Gastrointestinal: No abdominal pain.  No nausea, no vomiting. Genitourinary: Negative for dysuria. Musculoskeletal: Positive for back pain. Skin: Negative for rash. Neurological: Negative for headaches, focal weakness or numbness.  10-point ROS otherwise negative.  ____________________________________________   PHYSICAL EXAM:  VITAL SIGNS: ED Triage Vitals  Enc Vitals Group     BP 10/27/14  1951 136/71 mmHg     Pulse Rate 10/27/14 1951 75     Resp 10/27/14 1951 18     Temp 10/27/14 1951 98 F (36.7 C)     Temp Source 10/27/14 1951 Oral     SpO2 10/27/14 1951 98 %     Weight 10/27/14 1951 128 lb (58.06 kg)     Height 10/27/14 1951 5\' 2"  (1.575 m)     Head Cir --      Peak Flow --      Pain Score 10/27/14 1950 5     Pain Loc --      Pain Edu? --      Excl. in [place]? --     Constitutional: Alert and oriented. Well appearing and in no acute distress. Eyes: Conjunctivae are normal. PERRL. EOMI. Head: Atraumatic. Nose: No congestion/rhinnorhea. Neck: No stridor.   Hematological/Lymphatic/Immunilogical: No cervical lymphadenopathy. Cardiovascular: Normal rate, regular rhythm. Grossly normal heart sounds.  Good peripheral circulation. Respiratory: Normal respiratory effort.  No retractions. Lungs CTAB. Gastrointestinal: Soft and nontender. No distention. No CVA tenderness. Musculoskeletal: Back exam fails to show any gross deformity. There is moderate tenderness on palpation of the L5-S1 and sacral area bilaterally and paravertebral muscles. There is no muscle spasms noted at this time. Range of motion is restricted secondary to pain. No lower extremity tenderness nor edema.  No joint effusions. Neurologic:  Normal speech and language. No gross focal neurologic deficits are appreciated.  Gait was not tested. Reflexes are 2+ bilaterally. Skin:  Skin is warm, dry and  intact. No rash noted. Psychiatric: Mood and affect are normal. Speech and behavior are normal.  ____________________________________________   LABS (all labs ordered are listed, but only abnormal results are displayed)  Labs Reviewed  URINALYSIS COMPLETEWITH MICROSCOPIC ([place]) - Abnormal; Notable for the following:    Color, Urine STRAW (*)    APPearance CLEAR (*)    Hgb urine dipstick 1+ (*)    Squamous Epithelial / LPF 0-5 (*)    All other components within normal limits      RADIOLOGY  Lumbar  spine x-rays per radiologist showed multilevel disc disease from L2-L3 to L4-L5 level no acute findings were noted I, Johnn Hai, personally viewed and evaluated these images as part of my medical decision making.  ____________________________________________   PROCEDURES  Procedure(s) performed: None  Critical Care performed: No  ____________________________________________   INITIAL IMPRESSION / ASSESSMENT AND PLAN / ED COURSE  Pertinent labs & imaging results that were available during my care of the patient were reviewed by me and considered in my medical decision making (see chart for details   discussed findings with patient and husband. She is given a prescription for Norco as needed for pain. She'll follow-up with her doctor and continue the meloxicam. She was given the option of prednisone which she declined due to side effects.   FINAL CLINICAL IMPRESSION(S) / ED DIAGNOSES  Final diagnoses:  Acute low back pain      Johnn Hai, PA-C 10/27/14 2225  Earleen Newport, MD 10/27/14 2326

## 2014-11-05 ENCOUNTER — Emergency Department
Admission: EM | Admit: 2014-11-05 | Discharge: 2014-11-05 | Disposition: A | Discharge status: Home / Self Care | Payer: Medicare Other | Attending: Emergency Medicine | Admitting: Emergency Medicine

## 2014-11-05 ENCOUNTER — Encounter: Payer: Self-pay | Admitting: Emergency Medicine

## 2014-11-05 DIAGNOSIS — M545 Low back pain: Secondary | ICD-10-CM | POA: Diagnosis present

## 2014-11-05 DIAGNOSIS — Z720 Tobacco use: Secondary | ICD-10-CM | POA: Insufficient documentation

## 2014-11-05 DIAGNOSIS — M5430 Sciatica, unspecified side: Secondary | ICD-10-CM | POA: Diagnosis not present

## 2014-11-05 MED ORDER — PREDNISONE 10 MG PO TABS: ORAL_TABLET | ORAL | Status: DC

## 2014-11-05 NOTE — ED Notes (Addendum)
Patient present to ED with c/o o bilateral leg pain. Patient reports was seen here a week ago and was diagnosed with arthritis and reports was having increase pain. Patient reports increase pain when placing pressure..."I can't get comfortable any kind of way." Patient denies any other complaints at this time. Patient alert and oriented x 4.

## 2014-11-05 NOTE — ED Notes (Signed)
Dr. Goodman at bedside.  

## 2014-11-05 NOTE — ED Notes (Signed)

## 2014-11-05 NOTE — ED Notes (Signed)
Was seen last week for back pain  States pain eased off but returned today

## 2014-11-05 NOTE — Discharge Instructions (Signed)
Please seek medical attention for any high fevers, chest pain, shortness of breath, change in behavior, persistent vomiting, bloody stool or any other new or concerning symptoms.  Sciatica with Rehab The sciatic nerve runs from the back down the leg and is responsible for sensation and control of the muscles in the back (posterior) side of the thigh, lower leg, and foot. Sciatica is a condition that is characterized by inflammation of this nerve.  SYMPTOMS   Signs of nerve damage, including numbness and/or weakness along the posterior side of the lower extremity.  Pain in the back of the thigh that may also travel down the leg.  Pain that worsens when sitting for long periods of time.  Occasionally, pain in the back or buttock. CAUSES  Inflammation of the sciatic nerve is the cause of sciatica. The inflammation is due to something irritating the nerve. Common sources of irritation include:  Sitting for long periods of time.  Direct trauma to the nerve.  Arthritis of the spine.  Herniated or ruptured disk.  Slipping of the vertebrae (spondylolisthesis).  Pressure from soft tissues, such as muscles or ligament-like tissue (fascia). RISK INCREASES WITH:  Sports that place pressure or stress on the spine (football or weightlifting).  Poor strength and flexibility.  Failure to warm up properly before activity.  Family history of low back pain or disk disorders.  Previous back injury or surgery.  Poor body mechanics, especially when lifting, or poor posture. PREVENTION   Warm up and stretch properly before activity.  Maintain physical fitness:  Strength, flexibility, and endurance.  Cardiovascular fitness.  Learn and use proper technique, especially with posture and lifting. When possible, have coach correct improper technique.  Avoid activities that place stress on the spine. PROGNOSIS If treated properly, then sciatica usually resolves within 6 weeks. However,  occasionally surgery is necessary.  RELATED COMPLICATIONS   Permanent nerve damage, including pain, numbness, tingle, or weakness.  Chronic back pain.  Risks of surgery: infection, bleeding, nerve damage, or damage to surrounding tissues. TREATMENT Treatment initially involves resting from any activities that aggravate your symptoms. The use of ice and medication may help reduce pain and inflammation. The use of strengthening and stretching exercises may help reduce pain with activity. These exercises may be performed at home or with referral to a therapist. A therapist may recommend further treatments, such as transcutaneous electronic nerve stimulation (TENS) or ultrasound. Your caregiver may recommend corticosteroid injections to help reduce inflammation of the sciatic nerve. If symptoms persist despite non-surgical (conservative) treatment, then surgery may be recommended. MEDICATION  If pain medication is necessary, then nonsteroidal anti-inflammatory medications, such as aspirin and ibuprofen, or other minor pain relievers, such as acetaminophen, are often recommended.  Do not take pain medication for 7 days before surgery.  Prescription pain relievers may be given if deemed necessary by your caregiver. Use only as directed and only as much as you need.  Ointments applied to the skin may be helpful.  Corticosteroid injections may be given by your caregiver. These injections should be reserved for the most serious cases, because they may only be given a certain number of times. HEAT AND COLD  Cold treatment (icing) relieves pain and reduces inflammation. Cold treatment should be applied for 10 to 15 minutes every 2 to 3 hours for inflammation and pain and immediately after any activity that aggravates your symptoms. Use ice packs or massage the area with a piece of ice (ice massage).  Heat treatment may be used  prior to performing the stretching and strengthening activities prescribed  by your caregiver, physical therapist, or athletic trainer. Use a heat pack or soak the injury in warm water. SEEK MEDICAL CARE IF:  Treatment seems to offer no benefit, or the condition worsens.  Any medications produce adverse side effects. EXERCISES  RANGE OF MOTION (ROM) AND STRETCHING EXERCISES - Sciatica Most people with sciatic will find that their symptoms worsen with either excessive bending forward (flexion) or arching at the low back (extension). The exercises which will help resolve your symptoms will focus on the opposite motion. Your physician, physical therapist or athletic trainer will help you determine which exercises will be most helpful to resolve your low back pain. Do not complete any exercises without first consulting with your clinician. Discontinue any exercises which worsen your symptoms until you speak to your clinician. If you have pain, numbness or tingling which travels down into your buttocks, leg or foot, the goal of the therapy is for these symptoms to move closer to your back and eventually resolve. Occasionally, these leg symptoms will get better, but your low back pain may worsen; this is typically an indication of progress in your rehabilitation. Be certain to be very alert to any changes in your symptoms and the activities in which you participated in the 24 hours prior to the change. Sharing this information with your clinician will allow him/her to most efficiently treat your condition. These exercises may help you when beginning to rehabilitate your injury. Your symptoms may resolve with or without further involvement from your physician, physical therapist or athletic trainer. While completing these exercises, remember:   Restoring tissue flexibility helps normal motion to return to the joints. This allows healthier, less painful movement and activity.  An effective stretch should be held for at least 30 seconds.  A stretch should never be painful. You should  only feel a gentle lengthening or release in the stretched tissue. FLEXION RANGE OF MOTION AND STRETCHING EXERCISES: STRETCH - Flexion, Single Knee to Chest   Lie on a firm bed or floor with both legs extended in front of you.  Keeping one leg in contact with the floor, bring your opposite knee to your chest. Hold your leg in place by either grabbing behind your thigh or at your knee.  Pull until you feel a gentle stretch in your low back. Hold __________ seconds.  Slowly release your grasp and repeat the exercise with the opposite side. Repeat __________ times. Complete this exercise __________ times per day.  STRETCH - Flexion, Double Knee to Chest  Lie on a firm bed or floor with both legs extended in front of you.  Keeping one leg in contact with the floor, bring your opposite knee to your chest.  Tense your stomach muscles to support your back and then lift your other knee to your chest. Hold your legs in place by either grabbing behind your thighs or at your knees.  Pull both knees toward your chest until you feel a gentle stretch in your low back. Hold __________ seconds.  Tense your stomach muscles and slowly return one leg at a time to the floor. Repeat __________ times. Complete this exercise __________ times per day.  STRETCH - Low Trunk Rotation   Lie on a firm bed or floor. Keeping your legs in front of you, bend your knees so they are both pointed toward the ceiling and your feet are flat on the floor.  Extend your arms out to the  side. This will stabilize your upper body by keeping your shoulders in contact with the floor.  Gently and slowly drop both knees together to one side until you feel a gentle stretch in your low back. Hold for __________ seconds.  Tense your stomach muscles to support your low back as you bring your knees back to the starting position. Repeat the exercise to the other side. Repeat __________ times. Complete this exercise __________ times per  day  EXTENSION RANGE OF MOTION AND FLEXIBILITY EXERCISES: STRETCH - Extension, Prone on Elbows  Lie on your stomach on the floor, a bed will be too soft. Place your palms about shoulder width apart and at the height of your head.  Place your elbows under your shoulders. If this is too painful, stack pillows under your chest.  Allow your body to relax so that your hips drop lower and make contact more completely with the floor.  Hold this position for __________ seconds.  Slowly return to lying flat on the floor. Repeat __________ times. Complete this exercise __________ times per day.  RANGE OF MOTION - Extension, Prone Press Ups  Lie on your stomach on the floor, a bed will be too soft. Place your palms about shoulder width apart and at the height of your head.  Keeping your back as relaxed as possible, slowly straighten your elbows while keeping your hips on the floor. You may adjust the placement of your hands to maximize your comfort. As you gain motion, your hands will come more underneath your shoulders.  Hold this position __________ seconds.  Slowly return to lying flat on the floor. Repeat __________ times. Complete this exercise __________ times per day.  STRENGTHENING EXERCISES - Sciatica  These exercises may help you when beginning to rehabilitate your injury. These exercises should be done near your "sweet spot." This is the neutral, low-back arch, somewhere between fully rounded and fully arched, that is your least painful position. When performed in this safe range of motion, these exercises can be used for people who have either a flexion or extension based injury. These exercises may resolve your symptoms with or without further involvement from your physician, physical therapist or athletic trainer. While completing these exercises, remember:   Muscles can gain both the endurance and the strength needed for everyday activities through controlled exercises.  Complete  these exercises as instructed by your physician, physical therapist or athletic trainer. Progress with the resistance and repetition exercises only as your caregiver advises.  You may experience muscle soreness or fatigue, but the pain or discomfort you are trying to eliminate should never worsen during these exercises. If this pain does worsen, stop and make certain you are following the directions exactly. If the pain is still present after adjustments, discontinue the exercise until you can discuss the trouble with your clinician. STRENGTHENING - Deep Abdominals, Pelvic Tilt   Lie on a firm bed or floor. Keeping your legs in front of you, bend your knees so they are both pointed toward the ceiling and your feet are flat on the floor.  Tense your lower abdominal muscles to press your low back into the floor. This motion will rotate your pelvis so that your tail bone is scooping upwards rather than pointing at your feet or into the floor.  With a gentle tension and even breathing, hold this position for __________ seconds. Repeat __________ times. Complete this exercise __________ times per day.  STRENGTHENING - Abdominals, Crunches   Lie on a firm  bed or floor. Keeping your legs in front of you, bend your knees so they are both pointed toward the ceiling and your feet are flat on the floor. Cross your arms over your chest.  Slightly tip your chin down without bending your neck.  Tense your abdominals and slowly lift your trunk high enough to just clear your shoulder blades. Lifting higher can put excessive stress on the low back and does not further strengthen your abdominal muscles.  Control your return to the starting position. Repeat __________ times. Complete this exercise __________ times per day.  STRENGTHENING - Quadruped, Opposite UE/LE Lift  Assume a hands and knees position on a firm surface. Keep your hands under your shoulders and your knees under your hips. You may place padding  under your knees for comfort.  Find your neutral spine and gently tense your abdominal muscles so that you can maintain this position. Your shoulders and hips should form a rectangle that is parallel with the floor and is not twisted.  Keeping your trunk steady, lift your right hand no higher than your shoulder and then your left leg no higher than your hip. Make sure you are not holding your breath. Hold this position __________ seconds.  Continuing to keep your abdominal muscles tense and your back steady, slowly return to your starting position. Repeat with the opposite arm and leg. Repeat __________ times. Complete this exercise __________ times per day.  STRENGTHENING - Abdominals and Quadriceps, Straight Leg Raise   Lie on a firm bed or floor with both legs extended in front of you.  Keeping one leg in contact with the floor, bend the other knee so that your foot can rest flat on the floor.  Find your neutral spine, and tense your abdominal muscles to maintain your spinal position throughout the exercise.  Slowly lift your straight leg off the floor about 6 inches for a count of 15, making sure to not hold your breath.  Still keeping your neutral spine, slowly lower your leg all the way to the floor. Repeat this exercise with each leg __________ times. Complete this exercise __________ times per day. POSTURE AND BODY MECHANICS CONSIDERATIONS - Sciatica Keeping correct posture when sitting, standing or completing your activities will reduce the stress put on different body tissues, allowing injured tissues a chance to heal and limiting painful experiences. The following are general guidelines for improved posture. Your physician or physical therapist will provide you with any instructions specific to your needs. While reading these guidelines, remember:  The exercises prescribed by your provider will help you have the flexibility and strength to maintain correct postures.  The correct  posture provides the optimal environment for your joints to work. All of your joints have less wear and tear when properly supported by a spine with good posture. This means you will experience a healthier, less painful body.  Correct posture must be practiced with all of your activities, especially prolonged sitting and standing. Correct posture is as important when doing repetitive low-stress activities (typing) as it is when doing a single heavy-load activity (lifting). RESTING POSITIONS Consider which positions are most painful for you when choosing a resting position. If you have pain with flexion-based activities (sitting, bending, stooping, squatting), choose a position that allows you to rest in a less flexed posture. You would want to avoid curling into a fetal position on your side. If your pain worsens with extension-based activities (prolonged standing, working overhead), avoid resting in an extended position  such as sleeping on your stomach. Most people will find more comfort when they rest with their spine in a more neutral position, neither too rounded nor too arched. Lying on a non-sagging bed on your side with a pillow between your knees, or on your back with a pillow under your knees will often provide some relief. Keep in mind, being in any one position for a prolonged period of time, no matter how correct your posture, can still lead to stiffness. PROPER SITTING POSTURE In order to minimize stress and discomfort on your spine, you must sit with correct posture Sitting with good posture should be effortless for a healthy body. Returning to good posture is a gradual process. Many people can work toward this most comfortably by using various supports until they have the flexibility and strength to maintain this posture on their own. When sitting with proper posture, your ears will fall over your shoulders and your shoulders will fall over your hips. You should use the back of the chair to  support your upper back. Your low back will be in a neutral position, just slightly arched. You may place a small pillow or folded towel at the base of your low back for support.  When working at a desk, create an environment that supports good, upright posture. Without extra support, muscles fatigue and lead to excessive strain on joints and other tissues. Keep these recommendations in mind: CHAIR:   A chair should be able to slide under your desk when your back makes contact with the back of the chair. This allows you to work closely.  The chair's height should allow your eyes to be level with the upper part of your monitor and your hands to be slightly lower than your elbows. BODY POSITION  Your feet should make contact with the floor. If this is not possible, use a foot rest.  Keep your ears over your shoulders. This will reduce stress on your neck and low back. INCORRECT SITTING POSTURES   If you are feeling tired and unable to assume a healthy sitting posture, do not slouch or slump. This puts excessive strain on your back tissues, causing more damage and pain. Healthier options include:  Using more support, like a lumbar pillow.  Switching tasks to something that requires you to be upright or walking.  Talking a brief walk.  Lying down to rest in a neutral-spine position. PROLONGED STANDING WHILE SLIGHTLY LEANING FORWARD  When completing a task that requires you to lean forward while standing in one place for a long time, place either foot up on a stationary 2-4 inch high object to help maintain the best posture. When both feet are on the ground, the low back tends to lose its slight inward curve. If this curve flattens (or becomes too large), then the back and your other joints will experience too much stress, fatigue more quickly and can cause pain.  CORRECT STANDING POSTURES Proper standing posture should be assumed with all daily activities, even if they only take a few moments,  like when brushing your teeth. As in sitting, your ears should fall over your shoulders and your shoulders should fall over your hips. You should keep a slight tension in your abdominal muscles to brace your spine. Your tailbone should point down to the ground, not behind your body, resulting in an over-extended swayback posture.  INCORRECT STANDING POSTURES  Common incorrect standing postures include a forward head, locked knees and/or an excessive swayback. WALKING Walk  with an upright posture. Your ears, shoulders and hips should all line-up. PROLONGED ACTIVITY IN A FLEXED POSITION When completing a task that requires you to bend forward at your waist or lean over a low surface, try to find a way to stabilize 3 of 4 of your limbs. You can place a hand or elbow on your thigh or rest a knee on the surface you are reaching across. This will provide you more stability so that your muscles do not fatigue as quickly. By keeping your knees relaxed, or slightly bent, you will also reduce stress across your low back. CORRECT LIFTING TECHNIQUES DO :   Assume a wide stance. This will provide you more stability and the opportunity to get as close as possible to the object which you are lifting.  Tense your abdominals to brace your spine; then bend at the knees and hips. Keeping your back locked in a neutral-spine position, lift using your leg muscles. Lift with your legs, keeping your back straight.  Test the weight of unknown objects before attempting to lift them.  Try to keep your elbows locked down at your sides in order get the best strength from your shoulders when carrying an object.  Always ask for help when lifting heavy or awkward objects. INCORRECT LIFTING TECHNIQUES DO NOT:   Lock your knees when lifting, even if it is a small object.  Bend and twist. Pivot at your feet or move your feet when needing to change directions.  Assume that you cannot safely pick up a paperclip without proper  posture. Document Released: 03/21/2005 Document Revised: 08/05/2013 Document Reviewed: 07/03/2008 [facility] Patient Information 2015 [place], [place]. This information is not intended to replace advice given to you by your health care provider. Make sure you discuss any questions you have with your health care provider.

## 2014-11-05 NOTE — ED Provider Notes (Signed)
[facility] Emergency Department Provider Note    ____________________________________________  Time seen: 1915  I have reviewed the triage vital signs and the nursing notes.   HISTORY  Chief Complaint Back Pain   History limited by: Not Limited   HPI Olivia Alvarado is a 64 y.o. female who presents to the emergency department today with a low back and leg pain. The patient states that she was seen in the emergency department roughly 10 days ago for similar symptoms. She states that the pain did resolve that time but then returned yesterday. She describes pain starting in her bilateral lower back. She does not have any midline pain. The pain then shoots down her legs. She denies any numbness or tingling in her legs. Denies any urinary retention or incontinence. Denies any bowel incontinence. Denies any fevers. Denies any trauma to her leg or back.     History reviewed. No pertinent past medical history.  There are no active problems to display for this patient.   History reviewed. No pertinent past surgical history.  Current Outpatient Rx  Name  Route  Sig  Dispense  Refill  . HYDROcodone-acetaminophen (NORCO/VICODIN) 5-325 MG per tablet   Oral   Take 1 tablet by mouth every 4 (four) hours as needed for moderate pain.   20 tablet   0   . predniSONE (DELTASONE) 10 MG tablet      Day 1: Take 50 mg orally Day 2: Take 40 mg orally Day 3: Take 30 mg orally Day 4: Take 20 mg orally Day 5: Take 10 mg orally   15 tablet   0     Allergies Review of patient's allergies indicates no known allergies.  No family history on file.  Social History History  Substance Use Topics  . Smoking status: Current Every Day Smoker  . Smokeless tobacco: Not on file  . Alcohol Use: No    Review of Systems  Constitutional: Negative for fever. Cardiovascular: Negative for chest pain. Respiratory: Negative for shortness of breath. Gastrointestinal:  Negative for abdominal pain, vomiting and diarrhea. Genitourinary: Negative for dysuria. Musculoskeletal: Positive for lower back pain. Skin: Negative for rash. Neurological: Negative for headaches, focal weakness or numbness.  10-point ROS otherwise negative.  ____________________________________________   PHYSICAL EXAM:  VITAL SIGNS: ED Triage Vitals  Enc Vitals Group     BP 11/05/14 1818 127/100 mmHg     Pulse Rate 11/05/14 1817 83     Resp 11/05/14 1817 20     Temp 11/05/14 1817 98.5 F (36.9 C)     Temp Source 11/05/14 1817 Oral     SpO2 11/05/14 1817 96 %     Weight 11/05/14 1817 128 lb (58.06 kg)     Height 11/05/14 1817 5\' 2"  (1.575 m)     Head Cir --      Peak Flow --      Pain Score 11/05/14 1817 5   Constitutional: Alert and oriented. Well appearing and in no distress. Eyes: Conjunctivae are normal. PERRL. Normal extraocular movements. ENT   Head: Normocephalic and atraumatic.   Nose: No congestion/rhinnorhea.   Mouth/Throat: Mucous membranes are moist.   Neck: No stridor. Hematological/Lymphatic/Immunilogical: No cervical lymphadenopathy. Cardiovascular: Normal rate, regular rhythm.  No murmurs, rubs, or gallops. Respiratory: Normal respiratory effort without tachypnea nor retractions. Breath sounds are clear and equal bilaterally. No wheezes/rales/rhonchi. Genitourinary: Deferred Musculoskeletal: Normal range of motion in all extremities. No joint effusions.  No lower extremity tenderness nor edema.  No tenderness with straight leg test. No midline spinal tenderness. Neurologic:  Normal speech and language. No gross focal neurologic deficits are appreciated. Speech is normal.  Skin:  Skin is warm, dry and intact. No rash noted. Psychiatric: Mood and affect are normal. Speech and behavior are normal. Patient exhibits appropriate insight and judgment.  ____________________________________________    LABS (pertinent  positives/negatives)  None  ____________________________________________   EKG  None  ____________________________________________    RADIOLOGY  None  ____________________________________________   PROCEDURES  Procedure(s) performed: None  Critical Care performed: No  ____________________________________________   INITIAL IMPRESSION / ASSESSMENT AND PLAN / ED COURSE  Pertinent labs & imaging results that were available during my care of the patient were reviewed by me and considered in my medical decision making (see chart for details).  Patient presents to the emergency department today with signs and symptoms concerning for sciatica. At this point patient does not have any symptoms of central cord lesion or compression. Do not think emergent imaging is required. Discussed with patient plan for sciatica. Will discharge with exercises and course of steroids. Patient states she still has narcotic pain medication from previous prescription.  ____________________________________________   FINAL CLINICAL IMPRESSION(S) / ED DIAGNOSES  Final diagnoses:  Sciatica, unspecified laterality     Nance Pear, MD 11/05/14 [phone]

## 2015-02-03 ENCOUNTER — Other Ambulatory Visit: Payer: Self-pay | Admitting: *Deleted

## 2015-03-05 ENCOUNTER — Encounter: Payer: Self-pay | Admitting: Oncology

## 2015-03-05 ENCOUNTER — Encounter (INDEPENDENT_AMBULATORY_CARE_PROVIDER_SITE_OTHER): Payer: Self-pay

## 2015-03-05 ENCOUNTER — Inpatient Hospital Stay: Payer: Medicare Other | Attending: Oncology | Admitting: Oncology

## 2015-03-05 DIAGNOSIS — Z800 Family history of malignant neoplasm of digestive organs: Secondary | ICD-10-CM | POA: Diagnosis present

## 2015-03-05 DIAGNOSIS — Z79899 Other long term (current) drug therapy: Secondary | ICD-10-CM | POA: Insufficient documentation

## 2015-03-05 DIAGNOSIS — K589 Irritable bowel syndrome without diarrhea: Secondary | ICD-10-CM | POA: Diagnosis not present

## 2015-03-05 DIAGNOSIS — F17210 Nicotine dependence, cigarettes, uncomplicated: Secondary | ICD-10-CM | POA: Diagnosis not present

## 2015-03-11 ENCOUNTER — Telehealth: Payer: Self-pay

## 2015-03-11 NOTE — Telephone Encounter (Signed)
Genetic testing has been canceled due to lack of insurance coverage and [facility] has not received permission from patient to proceed with testing at the patient's expense. [facility] case no. [id number]

## 2015-03-12 DIAGNOSIS — F330 Major depressive disorder, recurrent, mild: Secondary | ICD-10-CM | POA: Insufficient documentation

## 2015-03-20 NOTE — Progress Notes (Signed)
[facility]  Telephone:(336) [phone] Fax:(336) [phone]  ID: Olivia Alvarado OB: 1950/08/06  MR#: [id number]  [id number]  Patient Care Team: Sofie Hartigan, MD as PCP - General (Family Medicine)  CHIEF COMPLAINT:  Chief Complaint  Patient presents with  . Genetic Evaluation    INTERVAL HISTORY: Patient is a 64 year old female who was noted to have a significant family history of colon cancer and referred for further evaluation and consideration of genetic testing. Patient's most recent colonoscopy in November 2014 did not reveal any malignancy or polyps. She currently feels well and is asymptomatic. She has no neurologic complaints. She denies any fevers. She has good appetite and denies weight loss. She has no chest pain or shortness of breath. She denies any nausea, vomiting, constipation, or diarrhea. She has no melena or hematochezia. Patient feels at her baseline and offers no specific complaints today.  REVIEW OF SYSTEMS:   Review of Systems  Constitutional: Negative.   Respiratory: Negative.   Cardiovascular: Negative.   Gastrointestinal: Negative.  Negative for blood in stool and melena.  Genitourinary: Negative.   Musculoskeletal: Negative.   Neurological: Negative.     As per HPI. Otherwise, a complete review of systems is negatve.  PAST MEDICAL HISTORY: Past Medical History  Diagnosis Date  . Skin cancer   . IBS (irritable bowel syndrome)     PAST SURGICAL HISTORY: History reviewed. No pertinent past surgical history.  FAMILY HISTORY Family History  Problem Relation Age of Onset  . Breast cancer Maternal Grandmother 67  . Colon cancer Maternal Grandmother 25  . Colon cancer Paternal Grandmother 54  . Kidney cancer Mother 62  . Rectal cancer Mother 71  . Ovarian cancer Mother 20  . Colon cancer Maternal Uncle 25  . Colon cancer  22    Paternal Great Aunt  . Colon cancer  66    Paternal Great Aunt       ADVANCED  DIRECTIVES:    HEALTH MAINTENANCE: Social History  Substance Use Topics  . Smoking status: Current Every Day Smoker -- 0.50 packs/day for 40 years    Types: Cigarettes, E-cigarettes  . Smokeless tobacco: Never Used  . Alcohol Use: No     Colonoscopy:  PAP:  Bone density:  Lipid panel:  No Known Allergies  Current Outpatient Prescriptions  Medication Sig Dispense Refill  . FIBER SELECT GUMMIES CHEW Chew 2 tablets by mouth daily.     No current facility-administered medications for this visit.    OBJECTIVE: Filed Vitals:   03/05/15 1453  BP: 115/70  Pulse: 89  Temp: 97 F (36.1 C)  Resp: 16     Body mass index is 23.54 kg/(m^2).    ECOG FS:0 - Asymptomatic  General: Well-developed, well-nourished, no acute distress. Eyes: Pink conjunctiva, anicteric sclera. Lungs: Clear to auscultation bilaterally. Heart: Regular rate and rhythm. No rubs, murmurs, or gallops. Abdomen: Soft, nontender, nondistended. No organomegaly noted, normoactive bowel sounds. Musculoskeletal: No edema, cyanosis, or clubbing. Neuro: Alert, answering all questions appropriately. Cranial nerves grossly intact. Skin: No rashes or petechiae noted. Psych: Normal affect.  LAB RESULTS:  No results found for: NA, K, CL, CO2, GLUCOSE, BUN, CREATININE, CALCIUM, PROT, ALBUMIN, AST, ALT, ALKPHOS, BILITOT, GFRNONAA, GFRAA  No results found for: WBC, NEUTROABS, HGB, HCT, MCV, PLT   STUDIES: No results found.  ASSESSMENT: History of colon cancer.  PLAN:    1. Family history of colon cancer: Patient has multiple family members with reported colorectal cancer, but all  were greater than 64 years old. Her mother at age 81 was diagnosed with rectal cancer. Maternal grandmother was diagnosed with colon cancer at age 55. Maternal uncle had colon cancer at age of 36. It is unlikely patient has Lynch syndrome or other genetic abnormality leading to colon cancer, but given that 3 family members have been diagnosed  referral for genetic testing was been made. Ultimately, this was denied coverage by her insurance and patient elected not to pay out of pocket. It is recommended that patient continue to be vigilant with her colonoscopies given her family history. She expressed understanding that she is at higher risk of developing malignancy over the general population. No follow-up has been scheduled.  Approximately 45 minutes was spent in discussion of which greater than 50% was consultation.  Patient expressed understanding and was in agreement with this plan. She also understands that She can call clinic at any time with any questions, concerns, or complaints.   Lloyd Huger, MD   03/20/2015 1:18 PM

## 2015-07-23 ENCOUNTER — Encounter: Payer: Self-pay | Admitting: *Deleted

## 2015-07-28 ENCOUNTER — Encounter: Payer: Self-pay | Admitting: Urology

## 2015-07-28 ENCOUNTER — Ambulatory Visit (INDEPENDENT_AMBULATORY_CARE_PROVIDER_SITE_OTHER): Payer: Medicare Other | Admitting: Urology

## 2015-07-28 VITALS — BP 148/80 | HR 86 | Ht 62.0 in | Wt 133.0 lb

## 2015-07-28 DIAGNOSIS — N3281 Overactive bladder: Secondary | ICD-10-CM | POA: Diagnosis not present

## 2015-07-28 DIAGNOSIS — R102 Pelvic and perineal pain: Secondary | ICD-10-CM | POA: Diagnosis not present

## 2015-07-28 DIAGNOSIS — R3129 Other microscopic hematuria: Secondary | ICD-10-CM | POA: Diagnosis not present

## 2015-07-28 LAB — URINALYSIS, COMPLETE
BILIRUBIN UA: NEGATIVE
Glucose, UA: NEGATIVE
KETONES UA: NEGATIVE
Nitrite, UA: NEGATIVE
PH UA: 5 (ref 5.0–7.5)
PROTEIN UA: NEGATIVE
SPEC GRAV UA: 1.02 (ref 1.005–1.030)
UUROB: 0.2 mg/dL (ref 0.2–1.0)

## 2015-07-28 LAB — BLADDER SCAN AMB NON-IMAGING

## 2015-07-28 LAB — MICROSCOPIC EXAMINATION
Bacteria, UA: NONE SEEN
Epithelial Cells (non renal): >10 /hpf — AB (ref 0–10)

## 2015-07-28 NOTE — Progress Notes (Signed)
07/28/2015 3:29 PM   Olivia Alvarado 1950-12-08 [id number]  Referring provider: Sofie Hartigan, MD [place], [place] [address]  Chief Complaint  Patient presents with  . Over Active Bladder    HPI: Patient is a 65 year old Caucasian female who presents today for a 1 year follow-up for overactive bladder.  Patient states she is experiencing frequency of urination, urgency and nocturia.  She states she has had 2 urinary tract infections over the last year treated by her primary care physician Dr. Ellison Hughs.    She had been on vaginal estrogen cream, Myrbetriq and urethral dilation in the past with no relief of her urinary symptoms.  She did undergo cystoscopy one year ago and was not found to GU malignancies and her urine cytology from that visit was negative for malignancies.  She currently denies any dysuria, gross hematuria or suprapubic pain. She also denies fever, chills, nausea and vomiting. Her UA today noted 3-10 rbc's per high-power field.  She is a smoker. She has a family history of GU malignancy with her mother having kidney cancer and her father having bladder cancer.  PMH: Past Medical History  Diagnosis Date  . Skin cancer     BCC  . IBS (irritable bowel syndrome)   . Urinary frequency   . Urethral stricture   . Urinary incontinence, nocturnal enuresis   . OAB (overactive bladder)   . Lower back pain   . Squamous carcinoma ([place])   . Suprapubic pressure   . Nocturia   . Vulvar lesion   . Atrophic vaginitis   . Vaginal atrophy     Surgical History: Past Surgical History  Procedure Laterality Date  . Skin cancer excision      Home Medications:    Medication List       This list is accurate as of: 07/28/15  3:29 PM.  Always use your most recent med list.               FIBER SELECT GUMMIES Chew  Chew 2 tablets by mouth daily.     FLUoxetine 10 MG capsule  Commonly known as:  PROZAC    Take 10 mg by mouth daily.        Allergies: No Known Allergies  Family History: Family History  Problem Relation Age of Onset  . Breast cancer Maternal Grandmother 26  . Colon cancer Maternal Grandmother 20  . Colon cancer Paternal Grandmother 41  . Kidney cancer Mother 18  . Rectal cancer Mother 38  . Ovarian cancer Mother 27  . Colon cancer Maternal Uncle 57  . Colon cancer  78    Paternal Great Aunt  . Colon cancer  64    Paternal Great Aunt  . Arthritis      mother, Father  . Diabetes    . Hypertension    . Heart disease      Mother, Father  . Osteoporosis      Social History:  reports that she has been smoking Cigarettes and E-cigarettes.  She has a 20 pack-year smoking history. She has never used smokeless tobacco. She reports that she does not drink alcohol or use illicit drugs.  ROS: UROLOGY Frequent Urination?: Yes Hard to postpone urination?: No Burning/pain with urination?: No Get up at night to urinate?: Yes Leakage of urine?: No Urine stream starts and stops?: No Trouble starting stream?: No Do you have to strain to urinate?: No Blood  in urine?: No Urinary tract infection?: Yes Sexually transmitted disease?: No Injury to kidneys or bladder?: No Painful intercourse?: No Weak stream?: No Currently pregnant?: No Vaginal bleeding?: No Last menstrual period?: n  Gastrointestinal Nausea?: No Vomiting?: No Indigestion/heartburn?: No Diarrhea?: No Constipation?: No  Constitutional Fever: No Night sweats?: No Weight loss?: No Fatigue?: No  Skin Skin rash/lesions?: No Itching?: No  Eyes Blurred vision?: No Double vision?: No  Ears/Nose/Throat Sore throat?: No Sinus problems?: No  Hematologic/Lymphatic Swollen glands?: No Easy bruising?: No  Cardiovascular Leg swelling?: No Chest pain?: No  Respiratory Cough?: No Shortness of breath?: No  Endocrine Excessive thirst?: No  Musculoskeletal Back pain?: No Joint pain?:  No  Neurological Headaches?: No Dizziness?: No  Psychologic Depression?: No Anxiety?: No  Physical Exam: BP 148/80 mmHg  Pulse 86  Ht 5\' 2"  (1.575 m)  Wt 133 lb (60.328 kg)  BMI 24.32 kg/m2  Constitutional: Well nourished. Alert and oriented, No acute distress. HEENT: [place] AT, moist mucus membranes. Trachea midline, no masses. Cardiovascular: No clubbing, cyanosis, or edema. Respiratory: Normal respiratory effort, no increased work of breathing. GI: Abdomen is soft, non tender, non distended, no abdominal masses. Liver and spleen not palpable.  No hernias appreciated.  Stool sample for occult testing is not indicated.   GU: No CVA tenderness.  No bladder fullness or masses.  Atrophic external genitalia, normal pubic hair distribution, two small condylomas noted.  Normal urethral meatus, no lesions, no prolapse, no discharge.   No urethral masses, tenderness and/or tenderness. No bladder fullness, tenderness or masses. Normal vagina mucosa, good estrogen effect, no discharge, no lesions, good pelvic support, no cystocele or rectocele noted.  No cervical motion tenderness.  Uterus is freely mobile and non-fixed.  No left adnexal/parametria masses or tenderness noted.  Right adnexal/parametria tenderness is noted.  Anus and perineum are without rashes or lesions.    Skin: No rashes, bruises or suspicious lesions. Lymph: No cervical or inguinal adenopathy. Neurologic: Grossly intact, no focal deficits, moving all 4 extremities. Psychiatric: Normal mood and affect.  Laboratory Data: Urinalysis Results for orders placed or performed in visit on 07/28/15  Microscopic Examination  Result Value Ref Range   WBC, UA 6-10 (A) 0 -  5 /hpf   RBC, UA 3-10 (A) 0 -  2 /hpf   Epithelial Cells (non renal) >10 (A) 0 - 10 /hpf   Bacteria, UA None seen None seen/Few  Urinalysis, Complete  Result Value Ref Range   Specific Gravity, UA 1.020 1.005 - 1.030   pH, UA 5.0 5.0 - 7.5   Color, UA Yellow  Yellow   Appearance Ur Hazy (A) Clear   Leukocytes, UA 1+ (A) Negative   Protein, UA Negative Negative/Trace   Glucose, UA Negative Negative   Ketones, UA Negative Negative   RBC, UA 2+ (A) Negative   Bilirubin, UA Negative Negative   Urobilinogen, Ur 0.2 0.2 - 1.0 mg/dL   Nitrite, UA Negative Negative   Microscopic Examination See below:   BUN+Creat  Result Value Ref Range   BUN 14 8 - 27 mg/dL   Creatinine, Ser 0.55 (L) 0.57 - 1.00 mg/dL   GFR calc non Af Amer 99 >59 mL/min/1.73   GFR calc Af Amer 114 >59 mL/min/1.73   BUN/Creatinine Ratio 25 12 - 28  BLADDER SCAN AMB NON-IMAGING  Result Value Ref Range   Scan Result 87ml     Pertinent Imaging: Results for JOUMANA, BEICHNER (MRN [id number]) as of 07/28/2015 15:32  Ref.  Range 07/28/2015 14:41  Scan Result Unknown 7ml    Assessment & Plan:    1.  Microscopic hematuria:   Explained to patient the causes of blood in the urine are as follows: stones, UTI's, damage to the urinary tract and/or cancer.  It is explained to the patient that they will be scheduled for a CT Urogram with contrast material and that in rare instances, an allergic reaction can be serious and even life threatening with the injection of contrast material.   The patient denies any allergies to contrast, iodine and/or seafood and is not taking metformin.  I have explained to the patient that they will  be scheduled for a cystoscopy in our office to evaluate their bladder.  The cystoscopy consists of passing a tube with a lens up through their urethra and into their urinary bladder.   We will inject the urethra with a lidocaine gel prior to introducing the cystoscope to help with any discomfort during the procedure.   After the procedure, they might experience blood in the urine and discomfort with urination.  This will abate after the first few voids.  I have  encouraged the patient to increase water intake  during this time.  Patient denies any allergies to  lidocaine.   - Urinalysis, Complete - BLADDER SCAN AMB NON-IMAGING  2. Right adnexal pain:   This is a concerning finding in the postmenopausal woman.  She'll be undergoing a CT urogram in the future for further evaluation.   Return for CT Urogram report and cystoscopy for hematuria.  These notes generated with voice recognition software. I apologize for typographical errors.  Zara Council, [facility] [address], [place], [place] [address] [phone]

## 2015-07-29 LAB — BUN+CREAT
BUN/Creatinine Ratio: 25 (ref 12–28)
BUN: 14 mg/dL (ref 8–27)
Creatinine, Ser: 0.55 mg/dL — ABNORMAL LOW (ref 0.57–1.00)
GFR calc Af Amer: 114 mL/min/{1.73_m2} (ref >59)
GFR calc non Af Amer: 99 mL/min/{1.73_m2} (ref >59)

## 2015-08-02 DIAGNOSIS — R102 Pelvic and perineal pain: Secondary | ICD-10-CM | POA: Insufficient documentation

## 2015-08-02 DIAGNOSIS — R3129 Other microscopic hematuria: Secondary | ICD-10-CM | POA: Insufficient documentation

## 2015-08-03 ENCOUNTER — Ambulatory Visit
Admission: RE | Admit: 2015-08-03 | Discharge: 2015-08-03 | Disposition: A | Discharge status: Home / Self Care | Payer: Medicare Other | Source: Ambulatory Visit | Attending: Urology | Admitting: Urology

## 2015-08-03 DIAGNOSIS — R3129 Other microscopic hematuria: Secondary | ICD-10-CM | POA: Insufficient documentation

## 2015-08-03 IMAGING — CT CT ABD-PEL WO/W CM
3 of 10 series · 13 of 46 positions shown, 19 images · IV contrast (omnipaque)
Comparison: None.

CLINICAL DATA: Patient with history of overactive bladder. Urinary
frequency, urgency and nocturia. Recent urinary tract infections.

EXAM:
CT ABDOMEN AND PELVIS WITHOUT AND WITH CONTRAST
TECHNIQUE: Multidetector CT imaging of the abdomen and pelvis was performed
following the standard protocol before and following the bolus
administration of intravenous contrast.
CONTRAST:  125 cc Omnipaque 350

[Series 2: hematuria > 45 wo · axial · 0.70mm/px · z∈[-829,-729]mm · 3 of 88 slices shown]
[im 10/88  soft-tissue]
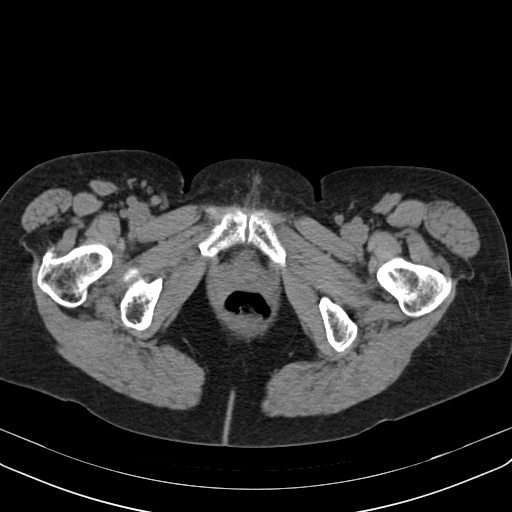
[im 20/88  soft-tissue]
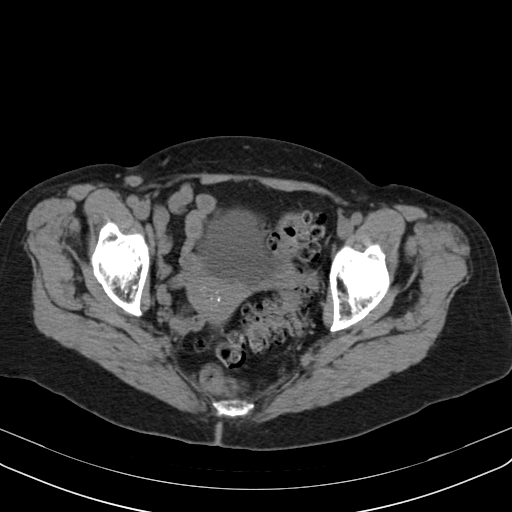
[im 30/88  soft-tissue]
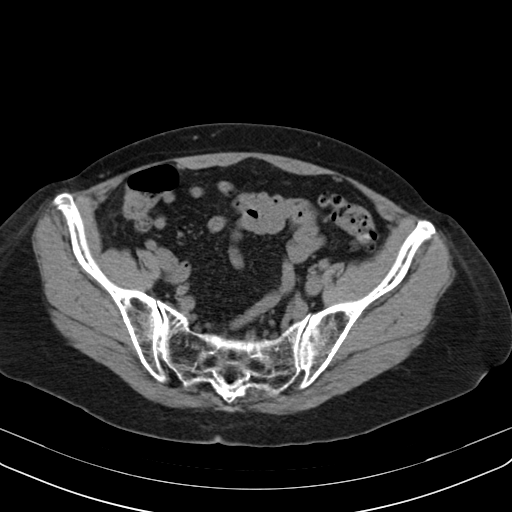

[Series 8: hematuria > 45 10 min delay · axial · delayed · 0.70mm/px · z∈[-915,-560]mm · 8 of 93 slices shown, 13 images]
[im 11/93  soft-tissue]
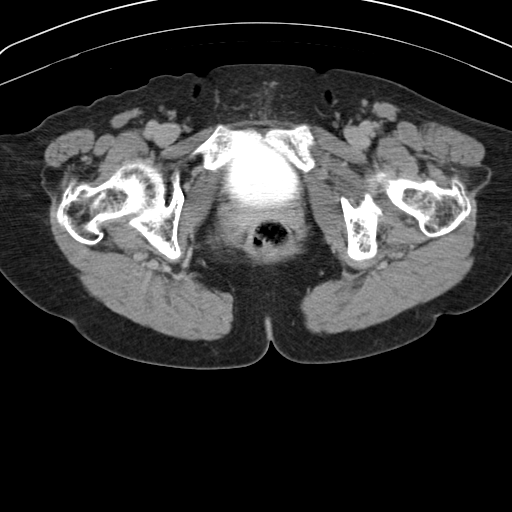
[im 11/93  bone]
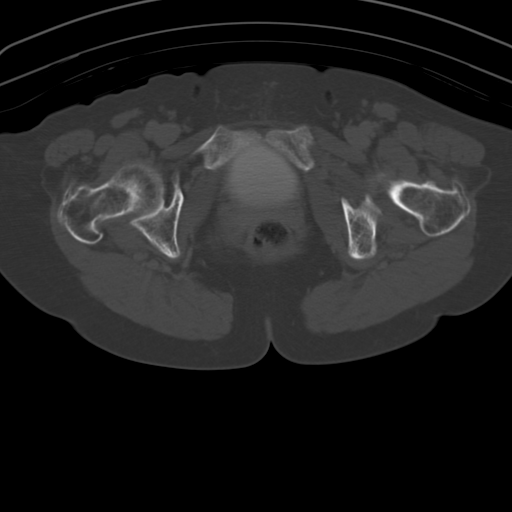
[im 21/93  soft-tissue]
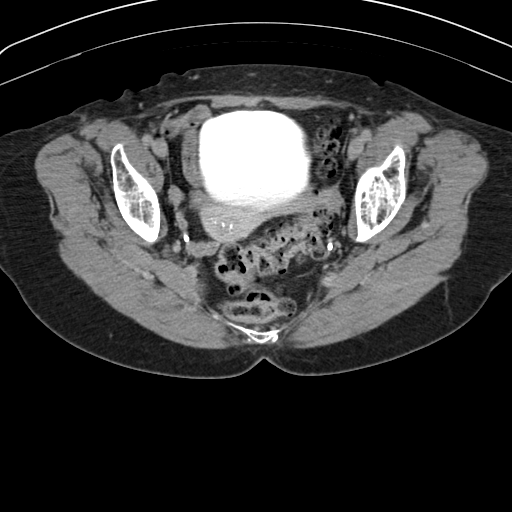
[im 31/93  soft-tissue]
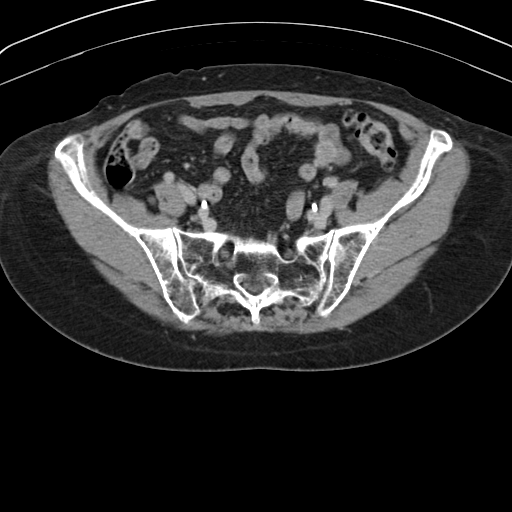
[im 41/93  soft-tissue]
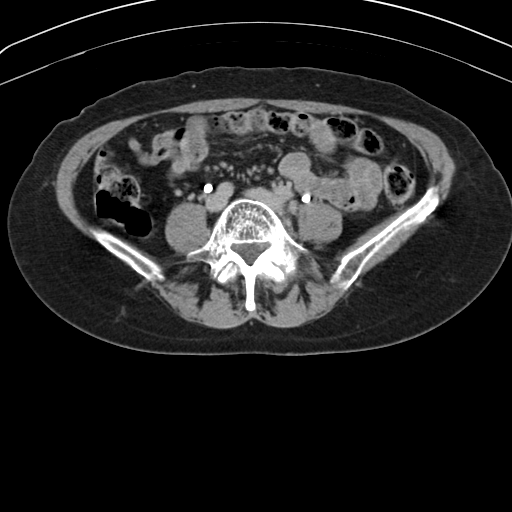
[im 52/93  soft-tissue]
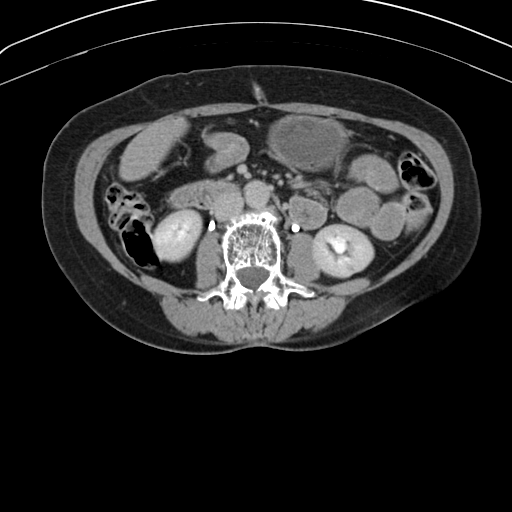
[im 52/93  lung]
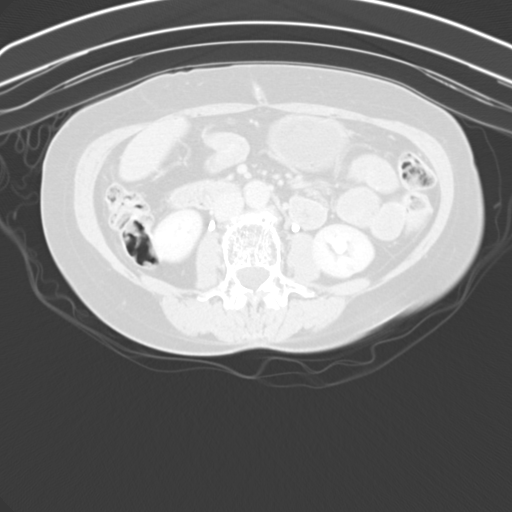
[im 62/93  soft-tissue]
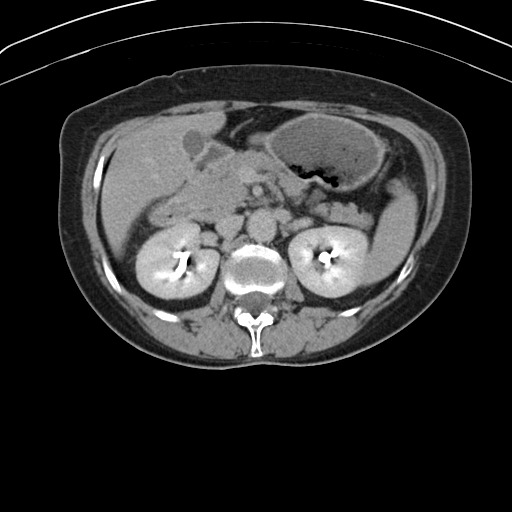
[im 62/93  lung]
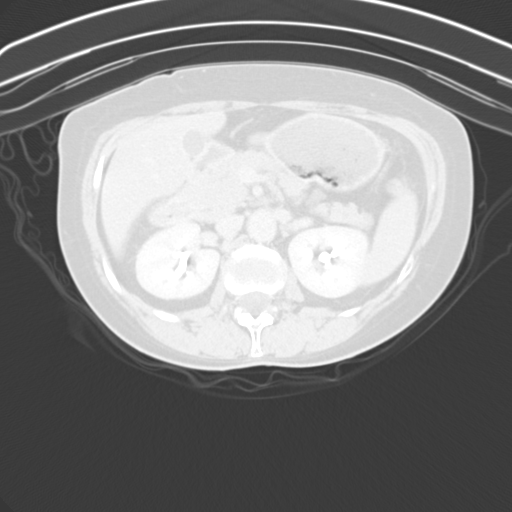
[im 72/93  soft-tissue]
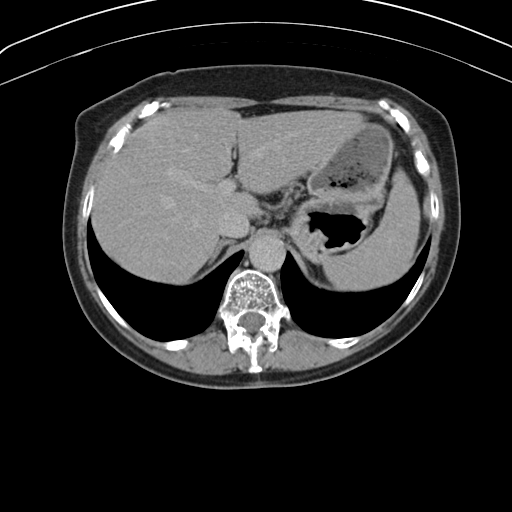
[im 72/93  lung]
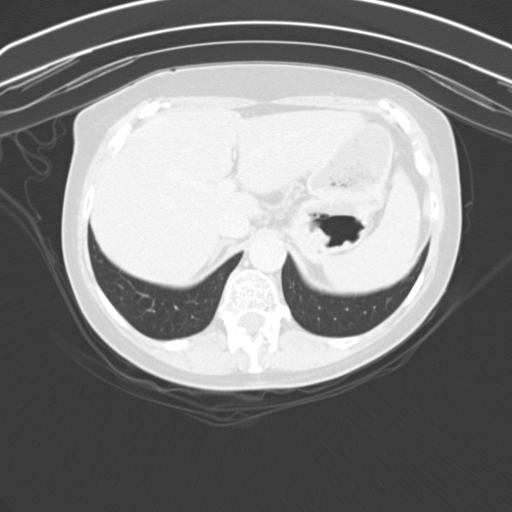
[im 82/93  soft-tissue]
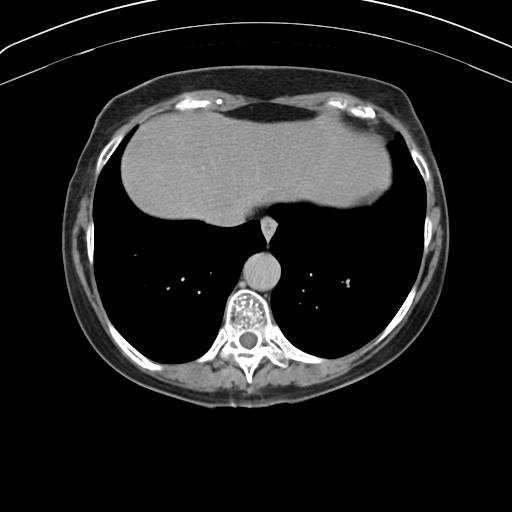
[im 82/93  lung]
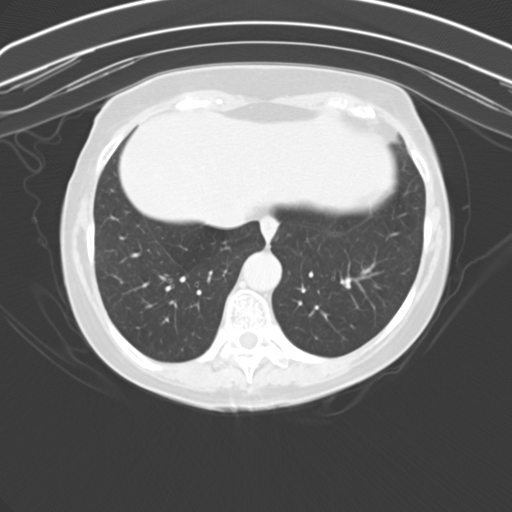

[Series 602: w/o coronal · coronal · non-contrast · 0.85mm/px · 2 of 91 slices shown, 3 images]
[im 31/91  soft-tissue]
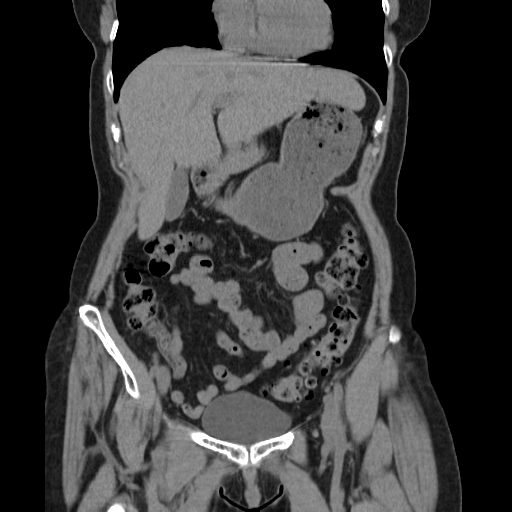
[im 31/91  bone]
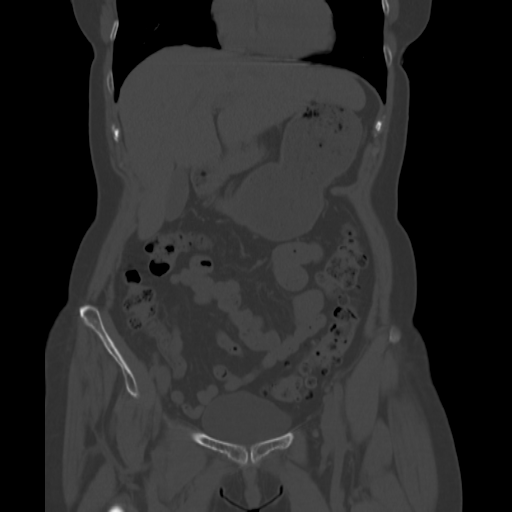
[im 61/91  soft-tissue]
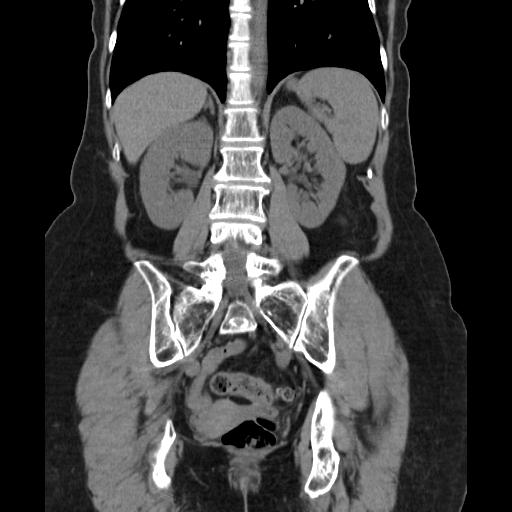

[13 of 46 positions shown; findings below may reference images not displayed]

FINDINGS: Lower chest: Normal heart size. No consolidative pulmonary
opacities. No pleural effusion.

Hepatobiliary: Liver is normal in size and contour. No focal lesion
identified. Gallbladder is unremarkable.

Pancreas: Unremarkable

Spleen: Unremarkable

Adrenals/Urinary Tract: Adrenal glands are normal. Kidneys enhance
symmetrically with contrast. Noncontrast images demonstrate no
nephroureterolithiasis. No hydronephrosis. Urinary bladder is
unremarkable. No suspicious enhancing renal lesions are identified.
Too small to characterize 6 mm low-attenuation lesion superior pole
left kidney. Delayed phase images demonstrate excretion of contrast
material into the bilateral renal collecting systems, ureters and
urinary bladder. No abnormal filling defects are identified.

Stomach/Bowel: Sigmoid colonic diverticulosis. No CT evidence for
acute diverticulitis. No free fluid or free intraperitoneal air. No
abnormal bowel wall thickening or evidence for obstruction.

Vascular/Lymphatic: Normal caliber abdominal aorta. No
retroperitoneal lymphadenopathy.

Other: Small probable calcified fibroids involving the uterus.
Adnexal structures are unremarkable.

Musculoskeletal: Osseous demineralization. Lumbar spine degenerative
changes.
IMPRESSION: No nephroureterolithiasis. No hydronephrosis. No suspicious
enhancing renal masses identified.

No acute process within the abdomen or pelvis.

## 2015-08-03 MED ORDER — IOHEXOL 350 MG/ML SOLN
125.0000 mL | Freq: Once | INTRAVENOUS | Status: AC | PRN
  Administered 2015-08-03: 125 mL via INTRAVENOUS

## 2015-08-10 ENCOUNTER — Encounter: Payer: Self-pay | Admitting: Urology

## 2015-08-10 ENCOUNTER — Ambulatory Visit (INDEPENDENT_AMBULATORY_CARE_PROVIDER_SITE_OTHER): Payer: Medicare Other | Admitting: Urology

## 2015-08-10 VITALS — BP 143/86 | HR 73 | Ht 62.0 in | Wt 132.2 lb

## 2015-08-10 DIAGNOSIS — R3129 Other microscopic hematuria: Secondary | ICD-10-CM

## 2015-08-10 LAB — URINALYSIS, COMPLETE
BILIRUBIN UA: NEGATIVE
Glucose, UA: NEGATIVE
Ketones, UA: NEGATIVE
NITRITE UA: NEGATIVE
PH UA: 5 (ref 5.0–7.5)
Protein, UA: NEGATIVE
Specific Gravity, UA: >1.03 — ABNORMAL HIGH (ref 1.005–1.030)
UUROB: 0.2 mg/dL (ref 0.2–1.0)

## 2015-08-10 LAB — MICROSCOPIC EXAMINATION

## 2015-08-10 MED ORDER — CIPROFLOXACIN HCL 500 MG PO TABS
500.0000 mg | ORAL_TABLET | Freq: Once | ORAL | Status: AC
  Administered 2015-08-10: 500 mg via ORAL

## 2015-08-10 MED ORDER — LIDOCAINE HCL 2 % EX GEL
1.0000 "application " | Freq: Once | CUTANEOUS | Status: AC
  Administered 2015-08-10: 1 via URETHRAL

## 2015-08-10 NOTE — Procedures (Signed)
    Cystoscopy Procedure Note  Patient identification was confirmed, informed consent was obtained, and patient was prepped using Betadine solution.  Lidocaine jelly was administered per urethral meatus.    Preoperative abx where received prior to procedure.    Procedure: - Flexible cystoscope introduced, without any difficulty.   - Thorough search of the bladder revealed:    normal urethral meatus    normal urothelium    no stones    no ulcers     no tumors    no urethral polyps    no trabeculation  - Ureteral orifices were normal in position and appearance.  Post-Procedure: - Patient tolerated the procedure well  Impression: I would've the patient's CT scan with her which demonstrated no clear evidence or etiology of her microscopic hematuria. Her cystoscopic evaluation was also normal. The patient urinary urge symptoms are not as bad this point and the patient is no longer seeking active treatment for. As such, she can follow up with [place] on an as-needed basis.

## 2015-11-27 ENCOUNTER — Other Ambulatory Visit: Payer: Self-pay | Admitting: Family Medicine

## 2015-11-27 DIAGNOSIS — R221 Localized swelling, mass and lump, neck: Secondary | ICD-10-CM

## 2015-12-02 ENCOUNTER — Ambulatory Visit
Admission: RE | Admit: 2015-12-02 | Discharge: 2015-12-02 | Disposition: A | Discharge status: Home / Self Care | Payer: Medicare Other | Source: Ambulatory Visit | Attending: Family Medicine | Admitting: Family Medicine

## 2015-12-02 DIAGNOSIS — R221 Localized swelling, mass and lump, neck: Secondary | ICD-10-CM | POA: Diagnosis present

## 2016-10-03 ENCOUNTER — Other Ambulatory Visit: Payer: Self-pay | Admitting: Nurse Practitioner

## 2016-10-03 DIAGNOSIS — R102 Pelvic and perineal pain: Secondary | ICD-10-CM

## 2016-10-03 DIAGNOSIS — R140 Abdominal distension (gaseous): Secondary | ICD-10-CM | POA: Insufficient documentation

## 2016-10-07 ENCOUNTER — Ambulatory Visit: Payer: Medicare Other

## 2016-10-14 ENCOUNTER — Ambulatory Visit
Admission: RE | Admit: 2016-10-14 | Discharge: 2016-10-14 | Disposition: A | Discharge status: Home / Self Care | Payer: Medicare Other | Source: Ambulatory Visit | Attending: Nurse Practitioner | Admitting: Nurse Practitioner

## 2016-10-14 DIAGNOSIS — D259 Leiomyoma of uterus, unspecified: Secondary | ICD-10-CM | POA: Insufficient documentation

## 2016-10-14 DIAGNOSIS — R102 Pelvic and perineal pain: Secondary | ICD-10-CM | POA: Diagnosis present

## 2016-12-06 ENCOUNTER — Other Ambulatory Visit: Payer: Self-pay | Admitting: Obstetrics and Gynecology

## 2016-12-06 DIAGNOSIS — Z1231 Encounter for screening mammogram for malignant neoplasm of breast: Secondary | ICD-10-CM

## 2016-12-26 ENCOUNTER — Ambulatory Visit
Admission: RE | Admit: 2016-12-26 | Discharge: 2016-12-26 | Disposition: A | Discharge status: Home / Self Care | Payer: Medicare Other | Source: Ambulatory Visit | Attending: Obstetrics and Gynecology | Admitting: Obstetrics and Gynecology

## 2016-12-26 DIAGNOSIS — Z1231 Encounter for screening mammogram for malignant neoplasm of breast: Secondary | ICD-10-CM | POA: Diagnosis present

## 2016-12-26 IMAGING — MG MM DIGITAL SCREENING BILAT W/ CAD
4 series · 4 of 4 positions shown · non-contrast
Comparison: Previous exam(s).

CLINICAL DATA: Screening.

EXAM:
DIGITAL SCREENING BILATERAL MAMMOGRAM WITH CAD

[R MLO]
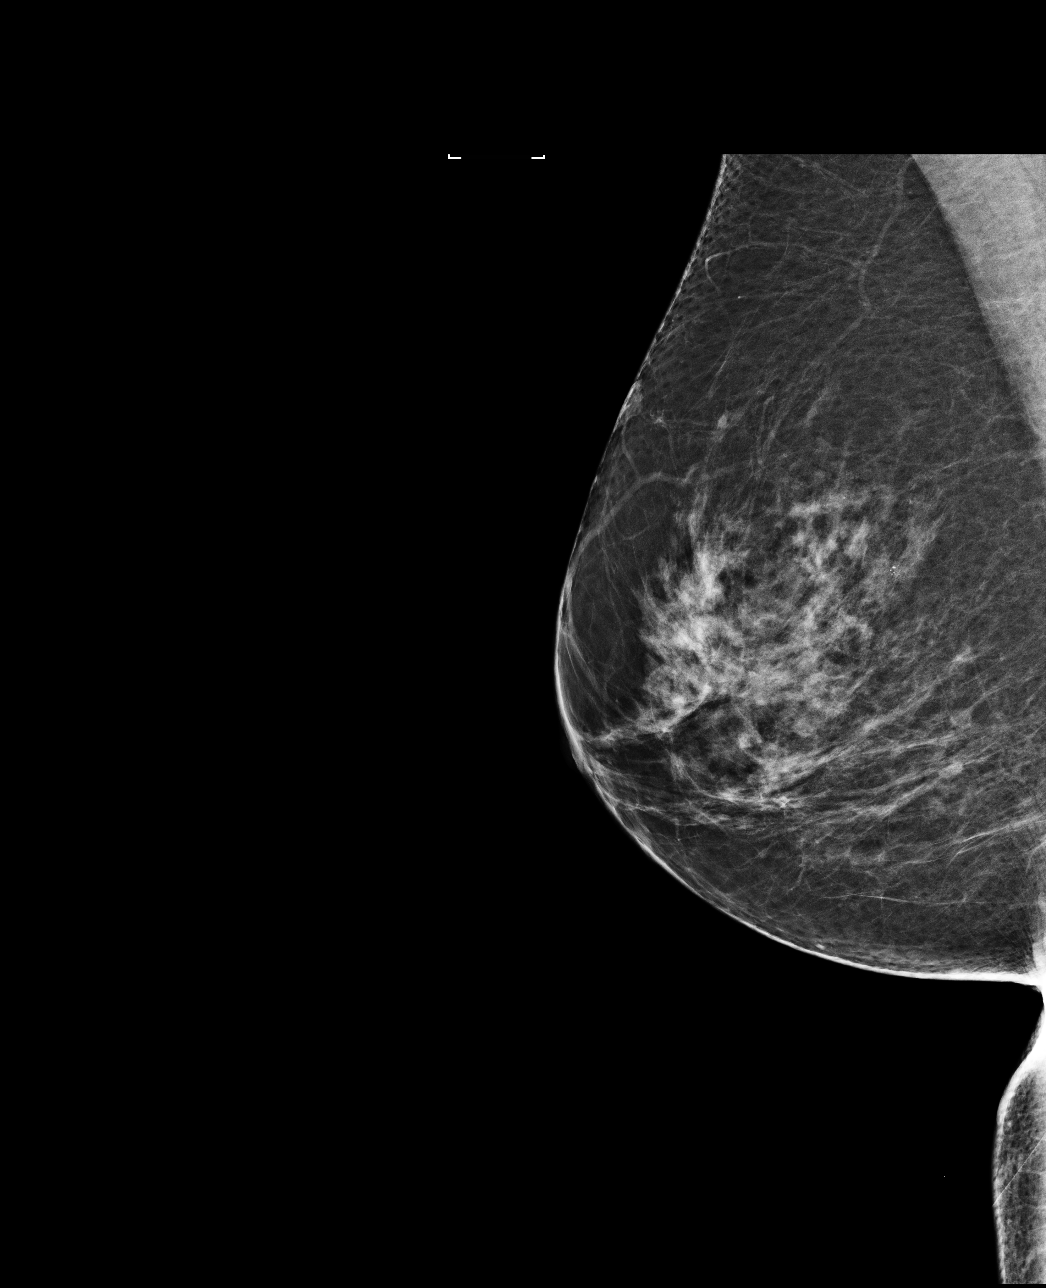

[L CC]
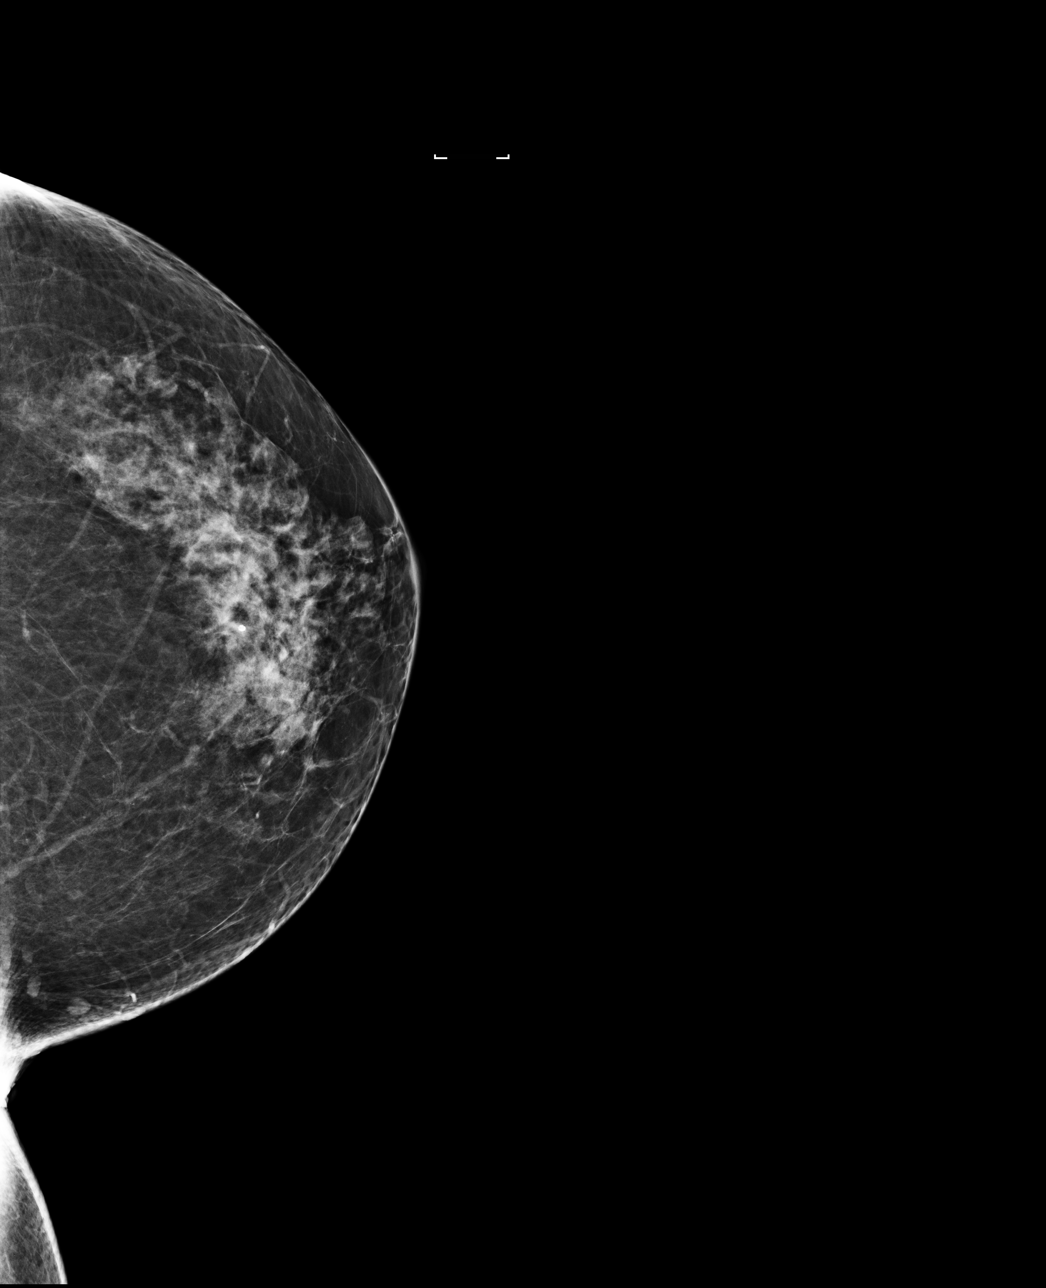

[R CC]
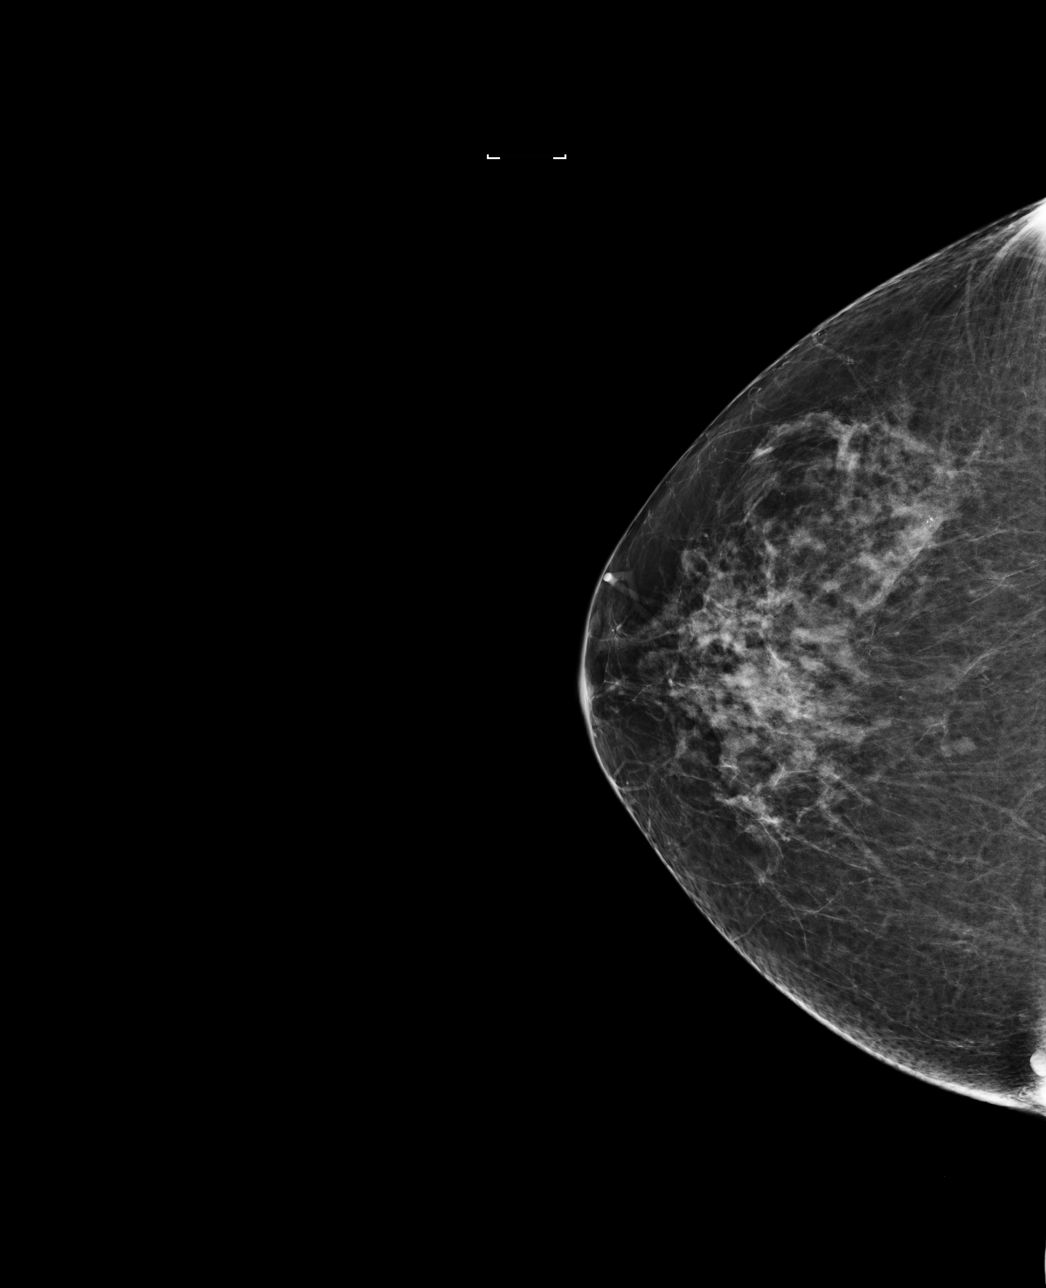

[L MLO]
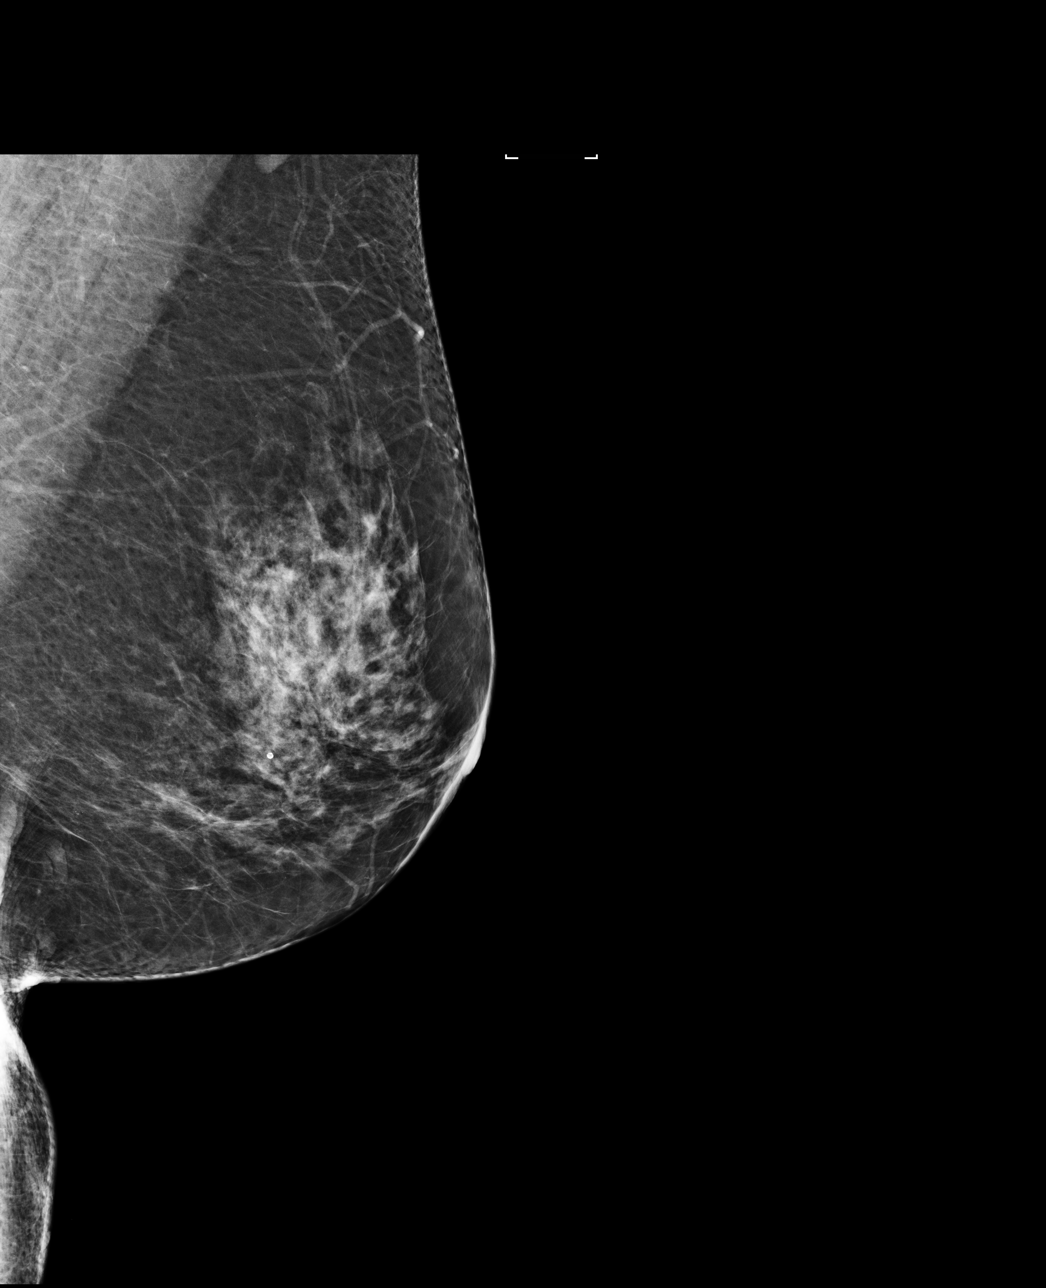

[4 of 4 positions shown; findings below may reference images not displayed]

ACR Breast Density Category c: The breast tissue is heterogeneously
dense, which may obscure small masses.
FINDINGS: There are no findings suspicious for malignancy. Images were
processed with CAD.
IMPRESSION: No mammographic evidence of malignancy. A result letter of this
screening mammogram will be mailed directly to the patient.

RECOMMENDATION:
Screening mammogram in one year. (Code:[0J])

BI-RADS CATEGORY  1: Negative.

## 2017-02-17 IMAGING — US US SOFT TISSUE HEAD/NECK
1 series · 12 of 25 positions shown · non-contrast
Comparison: None.

CLINICAL DATA: New palpable right nodule on physical exam

EXAM:
THYROID ULTRASOUND
TECHNIQUE: Ultrasound examination of the thyroid gland and adjacent soft
tissues was performed.

[Series 1: us soft tissue head/neck · 0.07mm/px · 12 of 87 slices shown]
[im 4/87]
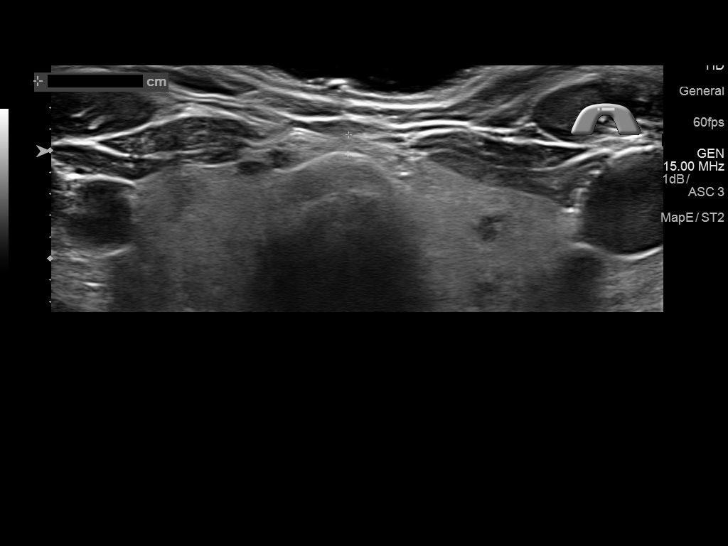
[im 11/87]
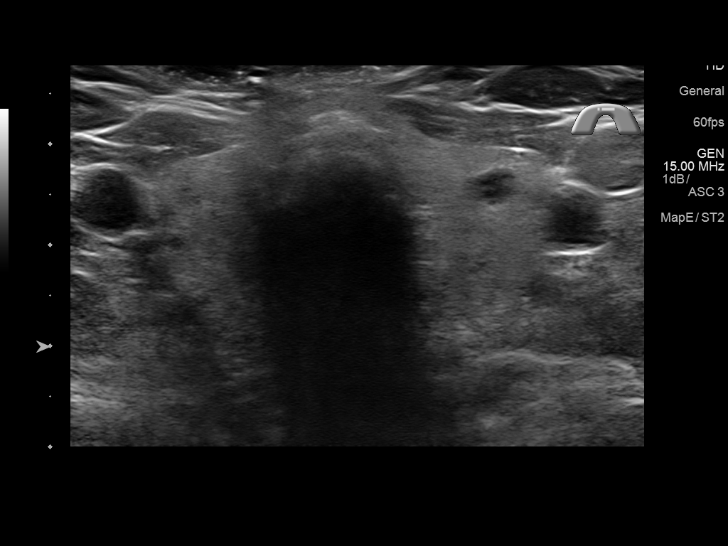
[im 18/87]
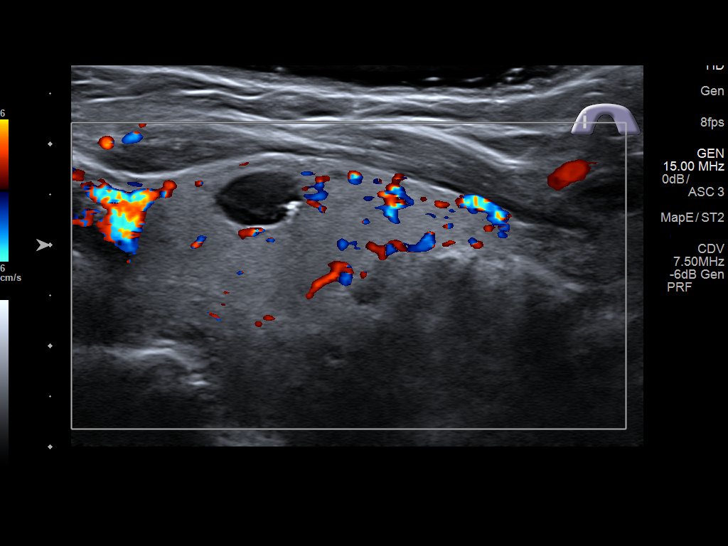
[im 26/87]
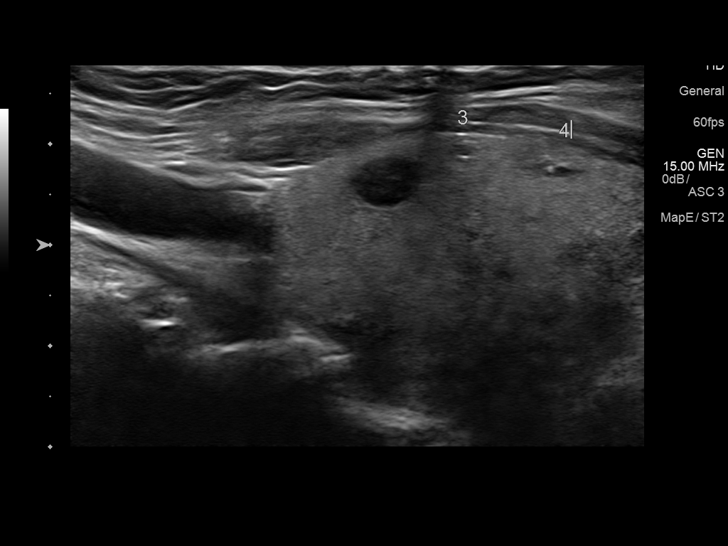
[im 33/87]
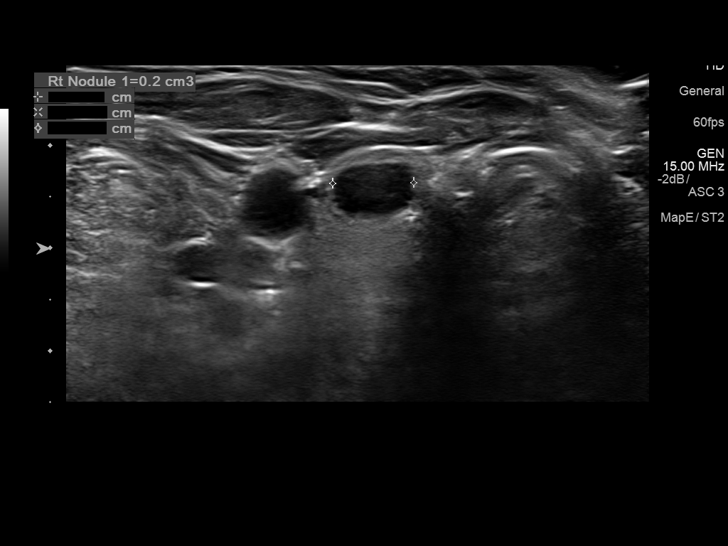
[im 40/87]
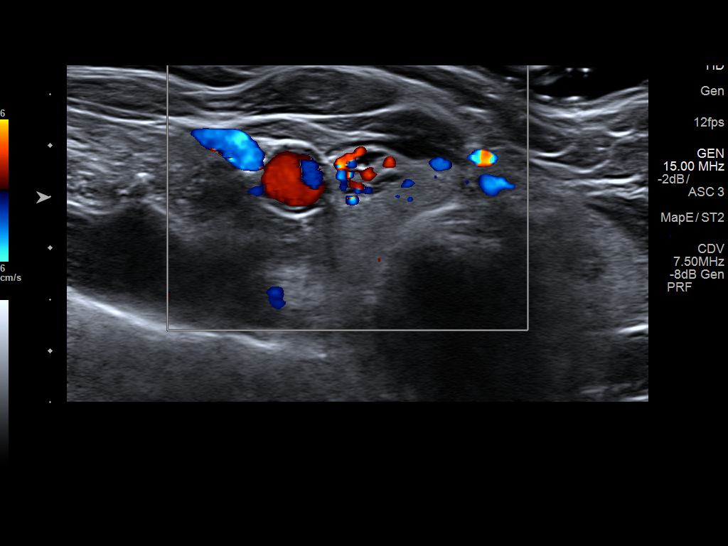
[im 47/87]
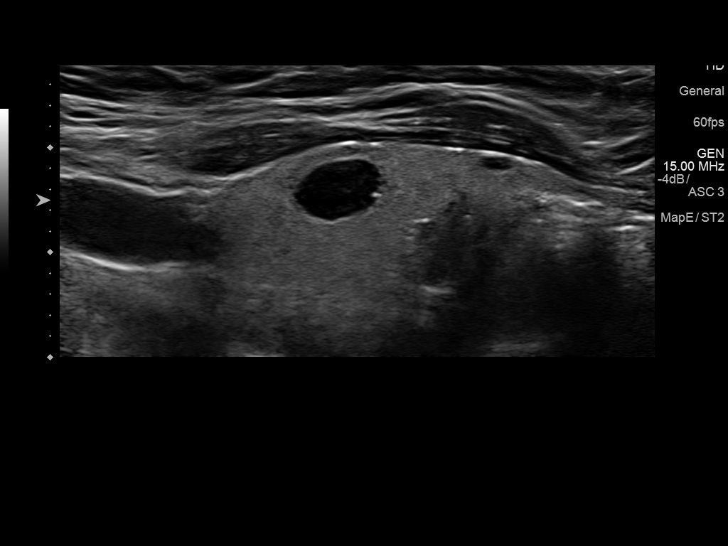
[im 54/87]
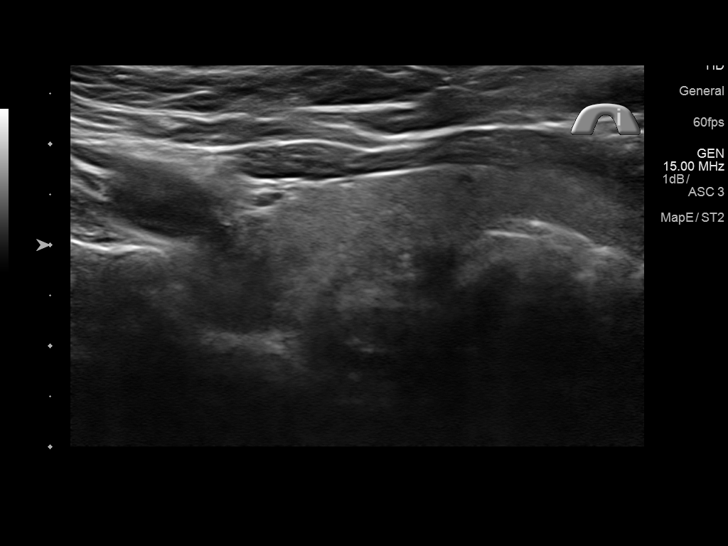
[im 61/87]
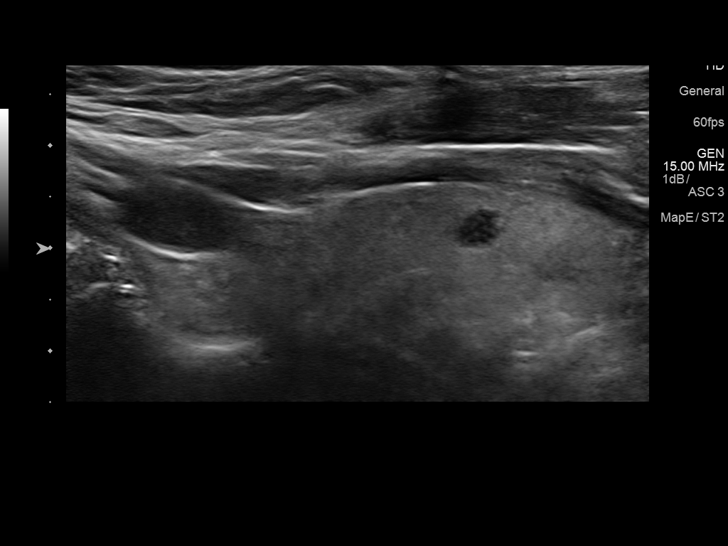
[im 69/87]
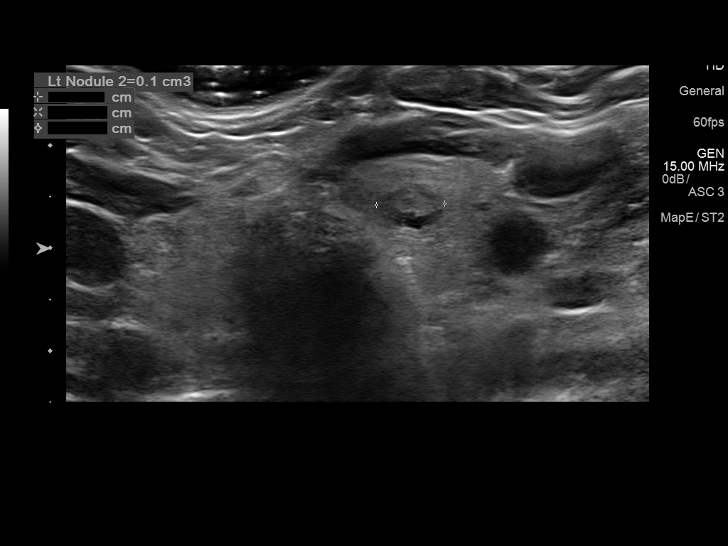
[im 76/87]
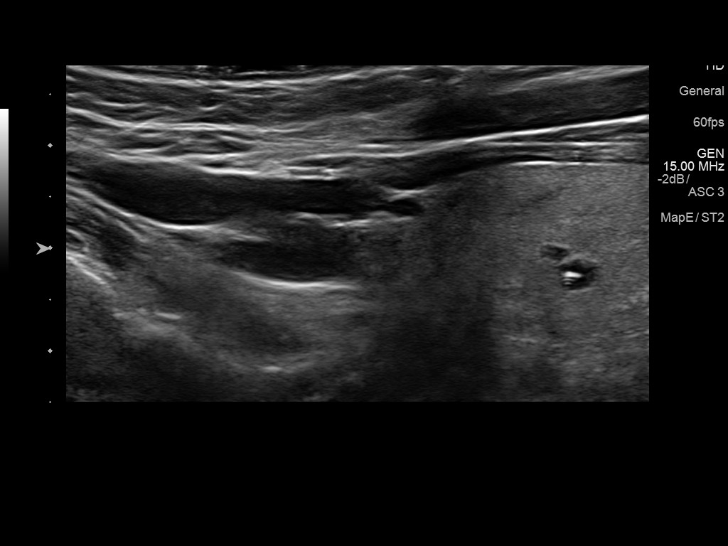
[im 83/87]
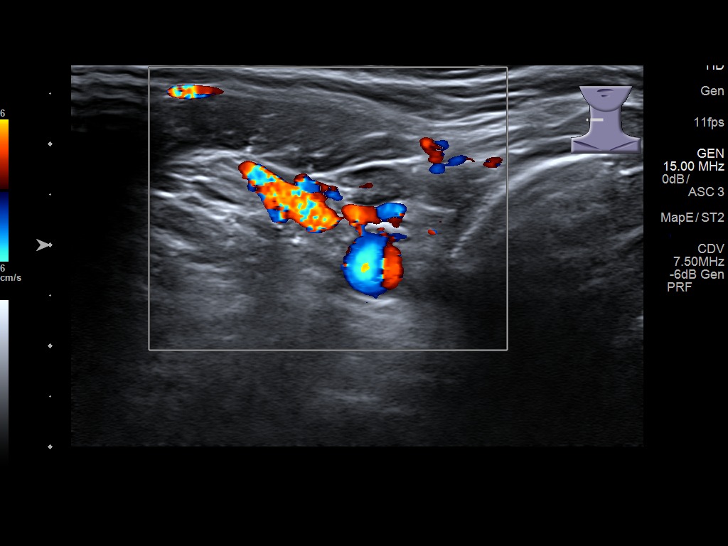

[12 of 25 positions shown; findings below may reference images not displayed]

FINDINGS: Parenchymal Echotexture: Mildly heterogenous

Estimated total number of nodules > 1 cm: <5

Number of spongiform nodules > 2 cm not described below (TR1): 0

Number of mixed cystic nodules > 1.5 cm not described below (TR2): 0

_________________________________________________________

Isthmus: 2 mm thickness

No discrete nodules are identified within the thyroid isthmus.

_________________________________________________________

Right lobe: 4.5 x 1.9 x 1.2 cm

Nodule # 1:

Location: Right; Superior

Size: 0.9 x 0.6 x 0.8 cm

Composition: cystic/almost completely cystic (0)

Echogenicity: anechoic (0)

Shape: not taller-than-wide (0)

Margins: smooth (0)

Echogenic foci: macrocalcifications (1)

ACR TI-RADS total points: 1.

ACR TI-RADS risk category: TR2 (2 points).

ACR TI-RADS recommendations:

This nodule does NOT meet TI-RADS criteria for biopsy or dedicated
follow-up.

Nodule # 2:

Location: Right; Inferior

Size: 1.4 x 0.5 x 0.7 cm

Composition: mixed cystic and solid (1)

Echogenicity: isoechoic (1)

Shape: not taller-than-wide (0)

Margins: smooth (0)

Echogenic foci: none (0)

ACR TI-RADS total points: 2.

ACR TI-RADS risk category: TR2 (2 points).

ACR TI-RADS recommendations:

This nodule does NOT meet TI-RADS criteria for biopsy or dedicated
follow-up.

At least 2 additional nodules less than 5 mm.

_________________________________________________________

Left lobe: 4.1 x 1.7 x 1.3 cm

Nodule # 1:

Location: Left; Mid

Size: 0.5 x 0.4 x 0.5 cm

Composition: cannot determine (2)

Echogenicity: hypoechoic (2)

Shape: not taller-than-wide (0)

Margins: smooth (0)

Echogenic foci: none (0)

ACR TI-RADS total points: 4.

ACR TI-RADS risk category: TR4 (4-6 points).

ACR TI-RADS recommendations:

Given size (<0.9 cm) and appearance, this nodule does NOT meet
TI-RADS criteria for biopsy or dedicated follow-up.

Nodule # 2:

Location: Left; Inferior

Size: 0.7 x 0.4 x 0.7 cm

Composition: solid/almost completely solid (2)

Echogenicity: isoechoic (1)

Shape: not taller-than-wide (0)

Margins: smooth (0)

Echogenic foci: none (0)

ACR TI-RADS total points: 3.

ACR TI-RADS risk category: TR3 (3 points).

ACR TI-RADS recommendations:

Given size (<1.5 cm) and appearance, this nodule does NOT meet
TI-RADS criteria for biopsy or dedicated follow-up.

Nodule # 3:

Location: Left; Inferior lateral

Size: 0.7 x 0.4 x 0.4 cm

Composition: cystic/almost completely cystic (0)

Echogenicity: anechoic (0)

Shape: not taller-than-wide (0)

Margins: smooth (0)

Echogenic foci: macrocalcifications (1)

ACR TI-RADS total points: 1.

ACR TI-RADS risk category: TR2 (2 points).

ACR TI-RADS recommendations:

This nodule does NOT meet TI-RADS criteria for biopsy or dedicated
follow-up.

At least 1 additional nodule less than 5 mm.
IMPRESSION: 1. Normal-sized thyroid with small bilateral nodules. None meets
criteria for biopsy nor for dedicated follow-up imaging.

The above is in keeping with the ACR TI-RADS recommendations - [HOSPITAL] [W2];[DATE].

## 2017-08-21 ENCOUNTER — Ambulatory Visit
Admission: EM | Admit: 2017-08-21 | Discharge: 2017-08-21 | Disposition: A | Discharge status: Home / Self Care | Payer: No Typology Code available for payment source | Attending: Family Medicine | Admitting: Family Medicine

## 2017-08-21 ENCOUNTER — Other Ambulatory Visit: Payer: Self-pay

## 2017-08-21 ENCOUNTER — Encounter: Payer: Self-pay | Admitting: Emergency Medicine

## 2017-08-21 ENCOUNTER — Ambulatory Visit: Payer: No Typology Code available for payment source

## 2017-08-21 DIAGNOSIS — F17210 Nicotine dependence, cigarettes, uncomplicated: Secondary | ICD-10-CM | POA: Insufficient documentation

## 2017-08-21 DIAGNOSIS — M79604 Pain in right leg: Secondary | ICD-10-CM | POA: Diagnosis not present

## 2017-08-21 DIAGNOSIS — K589 Irritable bowel syndrome without diarrhea: Secondary | ICD-10-CM | POA: Diagnosis not present

## 2017-08-21 DIAGNOSIS — Z8041 Family history of malignant neoplasm of ovary: Secondary | ICD-10-CM | POA: Diagnosis not present

## 2017-08-21 DIAGNOSIS — Z85828 Personal history of other malignant neoplasm of skin: Secondary | ICD-10-CM | POA: Insufficient documentation

## 2017-08-21 DIAGNOSIS — Z79899 Other long term (current) drug therapy: Secondary | ICD-10-CM | POA: Diagnosis not present

## 2017-08-21 DIAGNOSIS — Z8051 Family history of malignant neoplasm of kidney: Secondary | ICD-10-CM | POA: Insufficient documentation

## 2017-08-21 DIAGNOSIS — Z800 Family history of malignant neoplasm of digestive organs: Secondary | ICD-10-CM | POA: Diagnosis not present

## 2017-08-21 DIAGNOSIS — N3281 Overactive bladder: Secondary | ICD-10-CM | POA: Diagnosis not present

## 2017-08-21 DIAGNOSIS — Y92410 Unspecified street and highway as the place of occurrence of the external cause: Secondary | ICD-10-CM | POA: Insufficient documentation

## 2017-08-21 DIAGNOSIS — M79661 Pain in right lower leg: Secondary | ICD-10-CM | POA: Diagnosis present
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
## 2017-08-21 NOTE — Discharge Instructions (Addendum)
Ice. Rest. Drink plenty of fluids.   Follow up with your primary care physician this week as needed. Return to Urgent care for new or worsening concerns.
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
## 2017-11-27 ENCOUNTER — Other Ambulatory Visit: Payer: Self-pay | Admitting: Family Medicine

## 2017-11-27 DIAGNOSIS — Z1231 Encounter for screening mammogram for malignant neoplasm of breast: Secondary | ICD-10-CM

## 2017-12-27 ENCOUNTER — Ambulatory Visit
Admission: RE | Admit: 2017-12-27 | Discharge: 2017-12-27 | Disposition: A | Discharge status: Home / Self Care | Payer: Medicare Other | Source: Ambulatory Visit | Attending: Family Medicine | Admitting: Family Medicine

## 2017-12-27 ENCOUNTER — Encounter (INDEPENDENT_AMBULATORY_CARE_PROVIDER_SITE_OTHER): Payer: Self-pay

## 2017-12-27 DIAGNOSIS — Z1231 Encounter for screening mammogram for malignant neoplasm of breast: Secondary | ICD-10-CM

## 2017-12-27 IMAGING — MG MM DIGITAL SCREENING BILAT W/ TOMO W/ CAD
8 series · 8 of 24 positions shown · non-contrast
Comparison: Previous exam(s).

CLINICAL DATA: Screening.

EXAM:
DIGITAL SCREENING BILATERAL MAMMOGRAM WITH TOMO AND CAD

[L CC synth-2D]
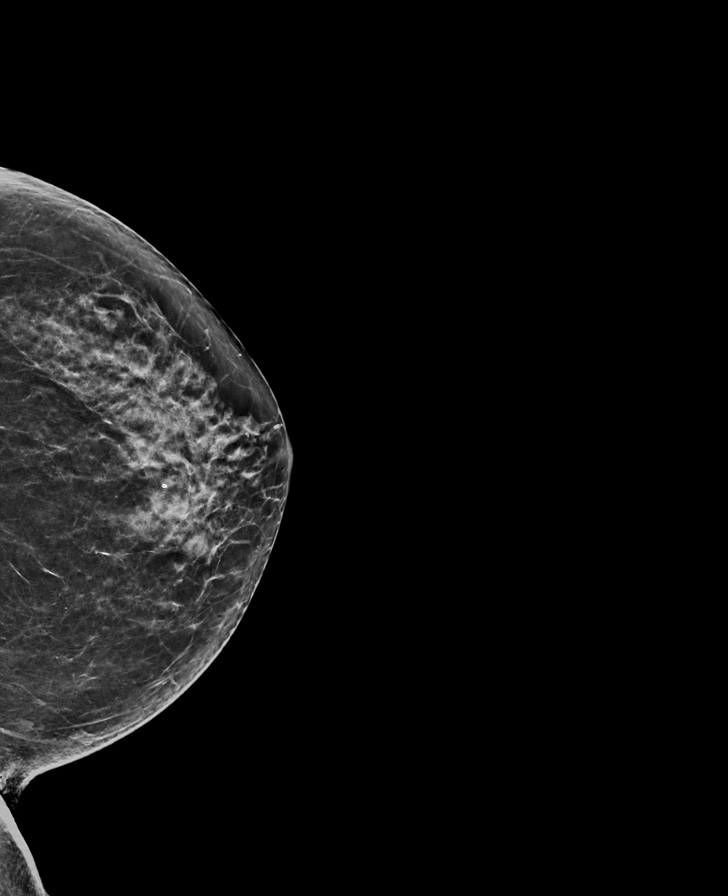

[R CC synth-2D]
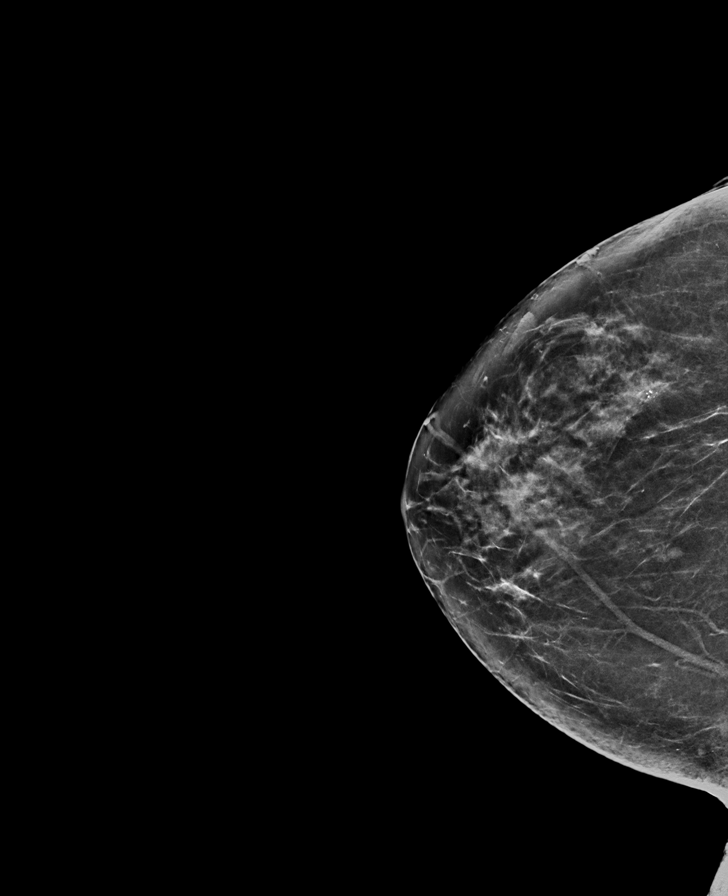

[R MLO synth-2D]
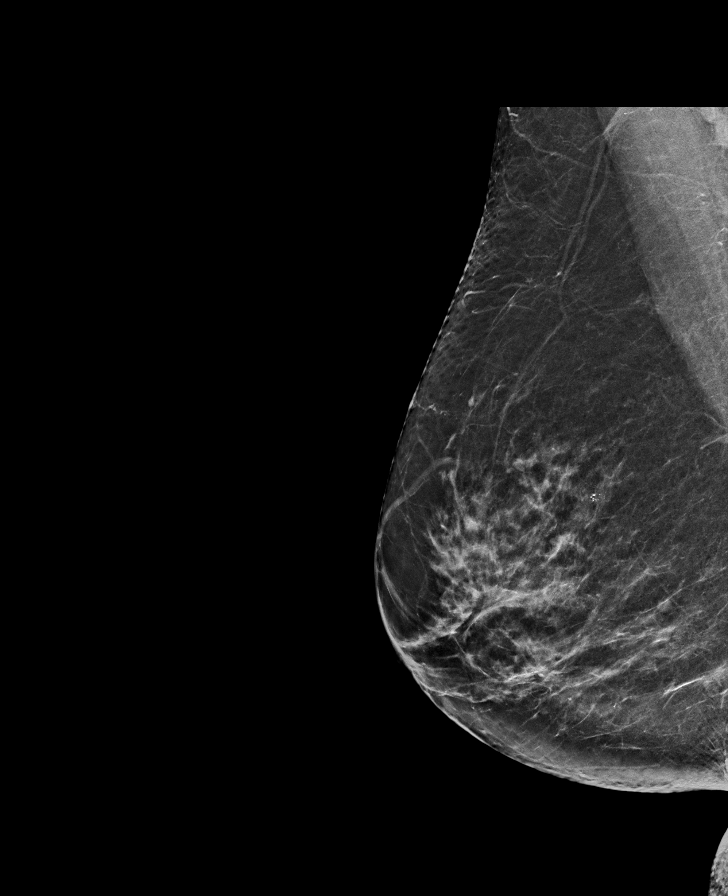

[L MLO synth-2D]
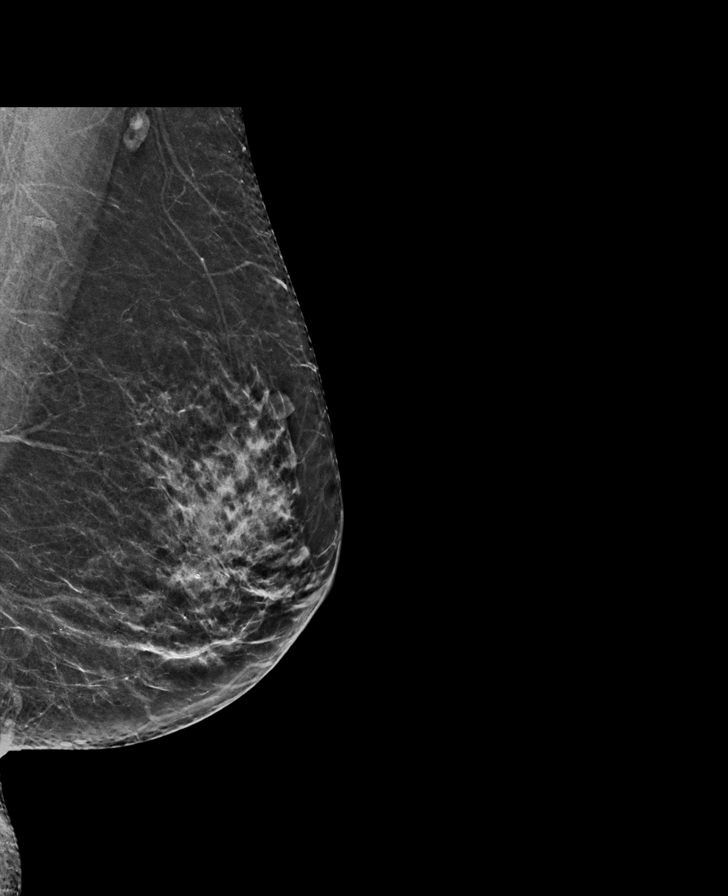

[R MLO tomo · tomo slice 35/70.0]
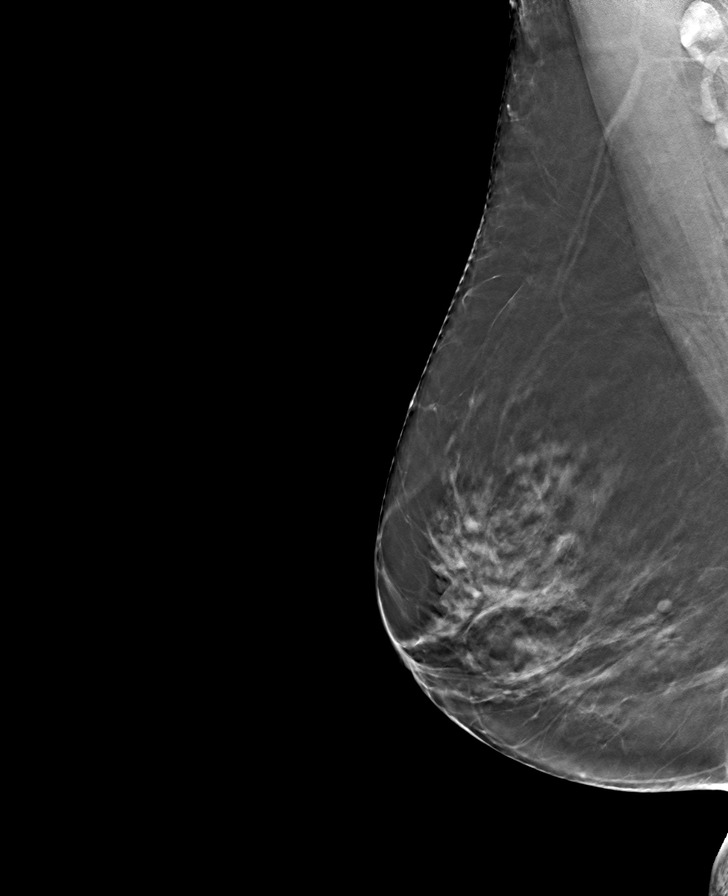

[L MLO tomo · tomo slice 31/62.0]
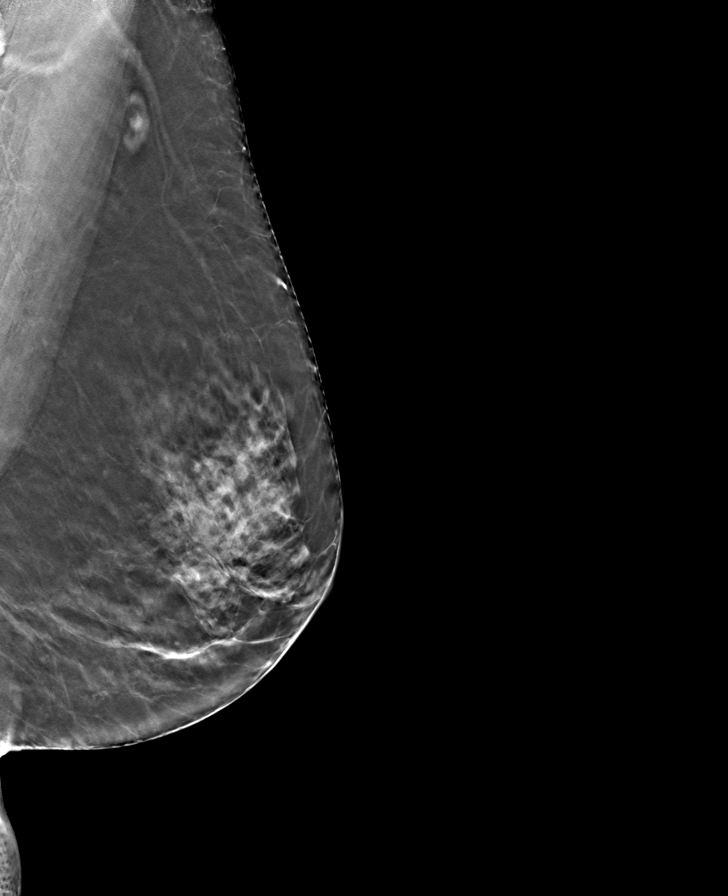

[R CC tomo · tomo slice 37/73.0]
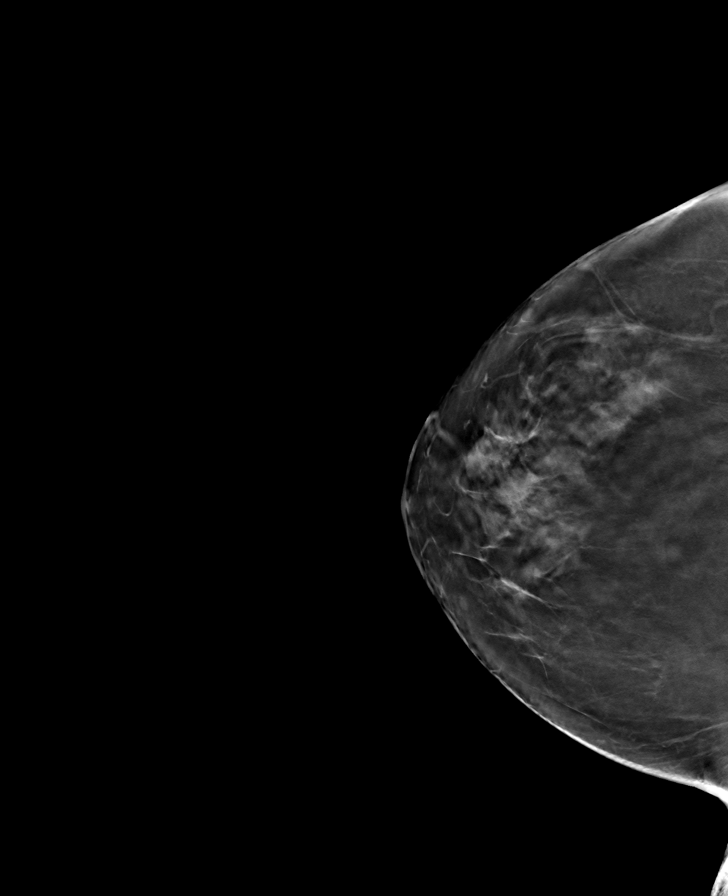

[L CC tomo · tomo slice 31/60.0]
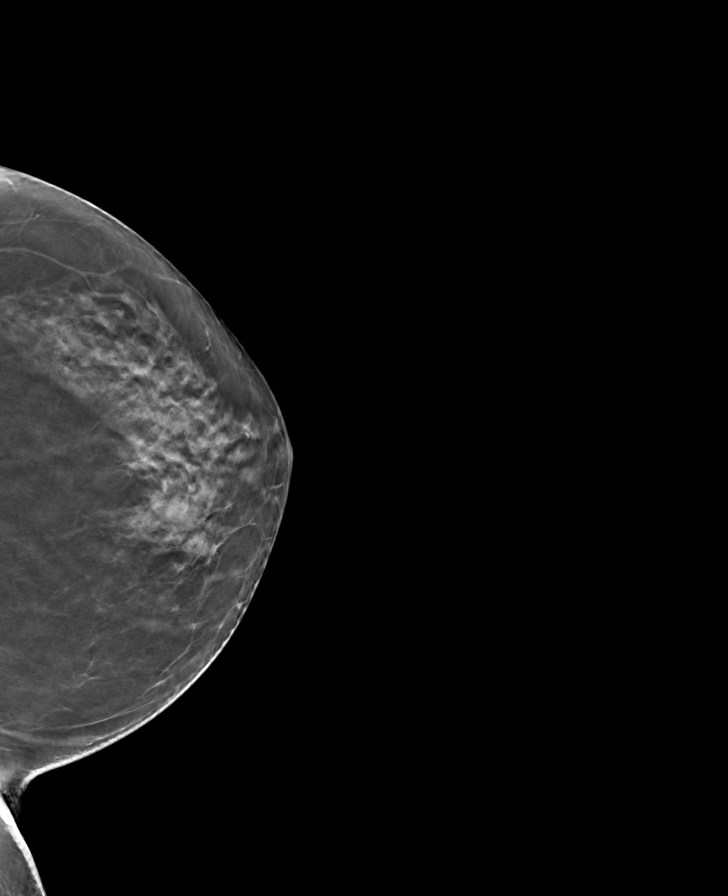

[8 of 24 positions shown; findings below may reference images not displayed]

ACR Breast Density Category c: The breast tissue is heterogeneously
dense, which may obscure small masses.
FINDINGS: There are no findings suspicious for malignancy. Images were
processed with CAD.
IMPRESSION: No mammographic evidence of malignancy. A result letter of this
screening mammogram will be mailed directly to the patient.

RECOMMENDATION:
Screening mammogram in one year. (Code:[5V])

BI-RADS CATEGORY  1: Negative.

## 2017-12-31 IMAGING — US US TRANSVAGINAL NON-OB
1 series · 13 of 25 positions shown · non-contrast
Comparison: CT on [DATE] and ultrasound on [DATE]

CLINICAL DATA: Pelvic pain for 3 months.  Postmenopausal female.

EXAM:
TRANSABDOMINAL AND TRANSVAGINAL ULTRASOUND OF PELVIS
TECHNIQUE: Both transabdominal and transvaginal ultrasound examinations of the
pelvis were performed. Transabdominal technique was performed for
global imaging of the pelvis including uterus, ovaries, adnexal
regions, and pelvic cul-de-sac. It was necessary to proceed with
endovaginal exam following the transabdominal exam to visualize the
endometrium and ovaries.

[Series 1: us transvaginal non-ob · 0.24mm/px · 13 of 60 slices shown]
[im 1/60]
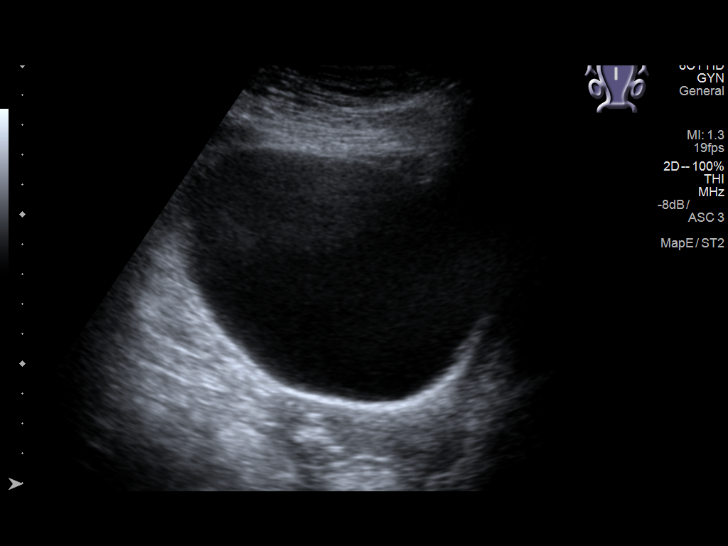
[im 5/60]
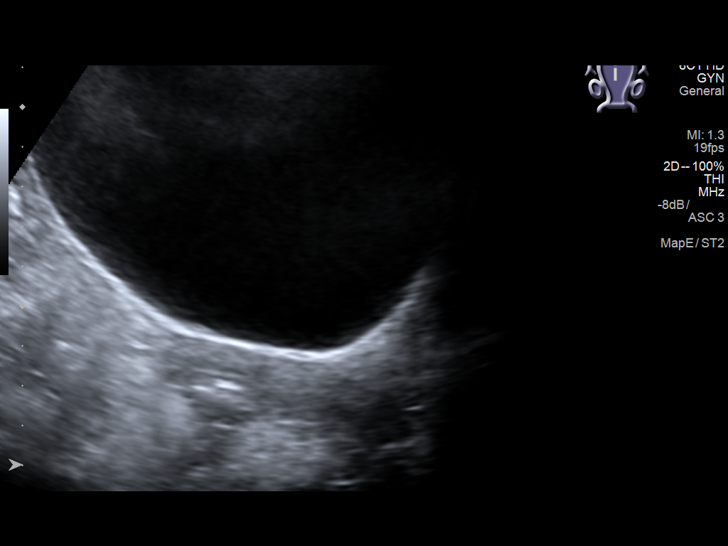
[im 10/60]
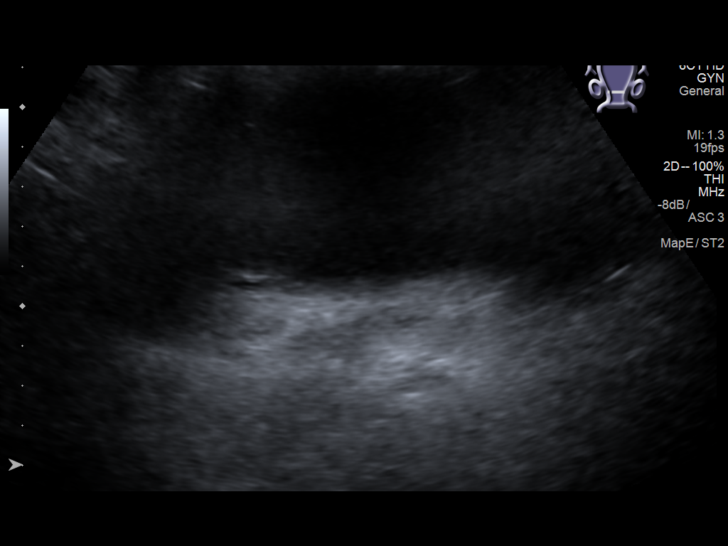
[im 15/60]
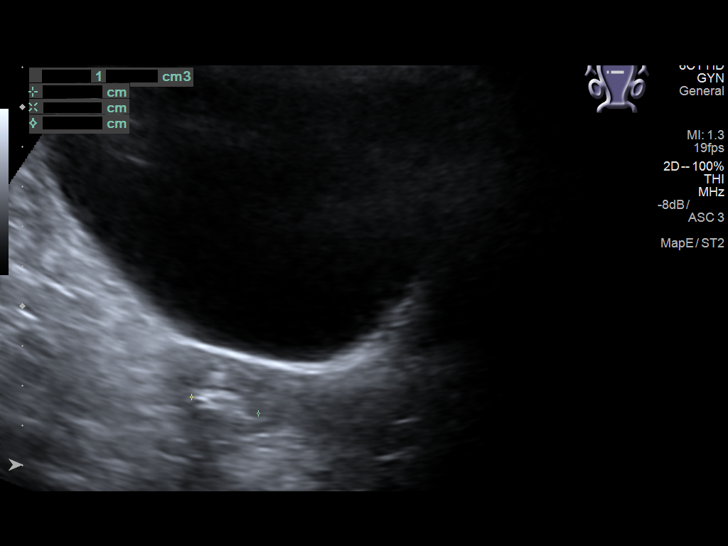
[im 20/60]
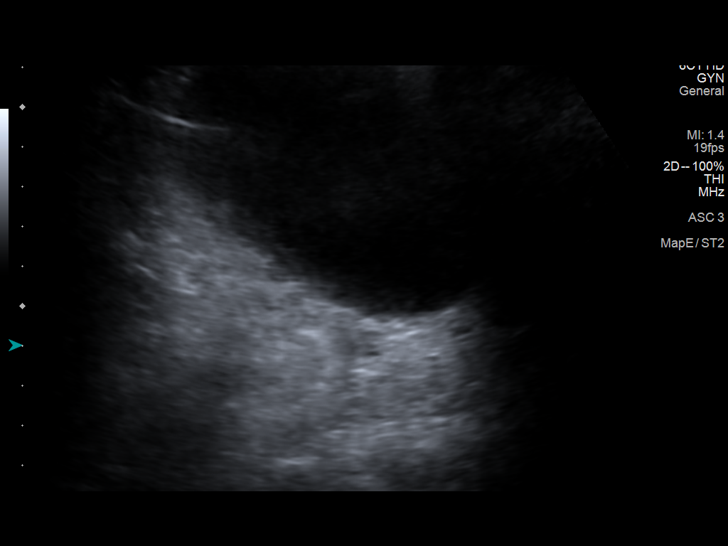
[im 25/60]
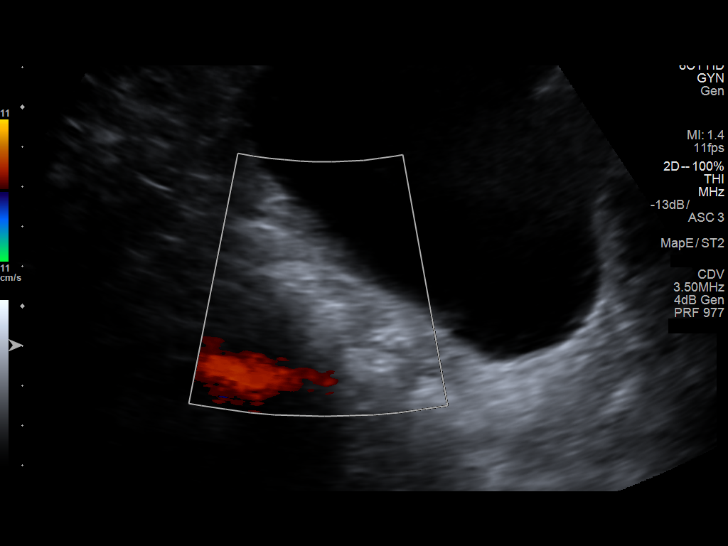
[im 30/60]
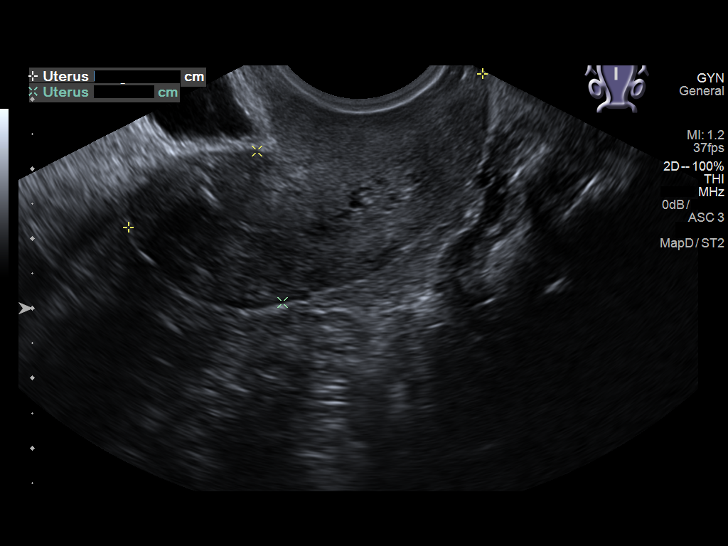
[im 35/60]
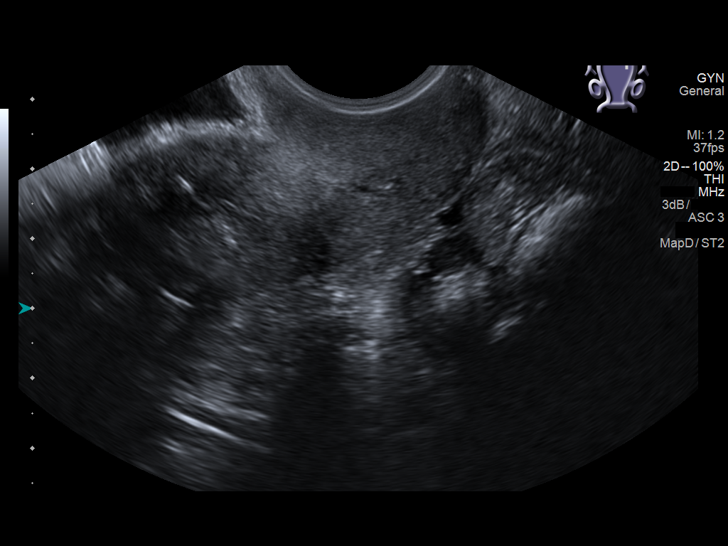
[im 40/60]
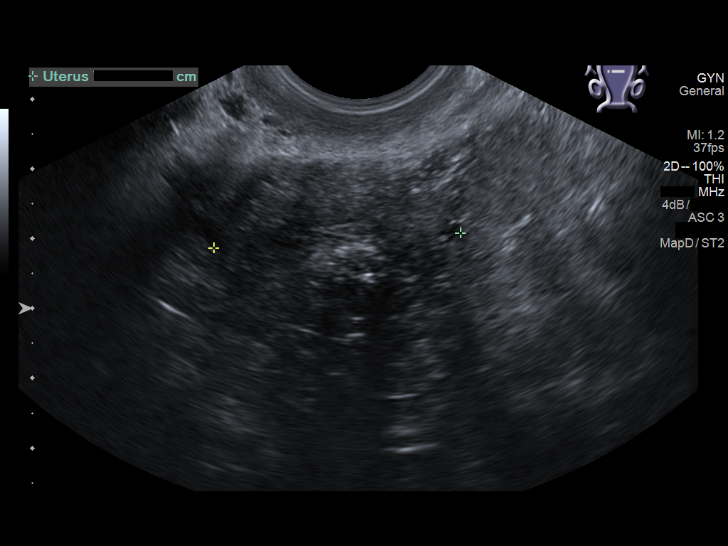
[im 45/60]
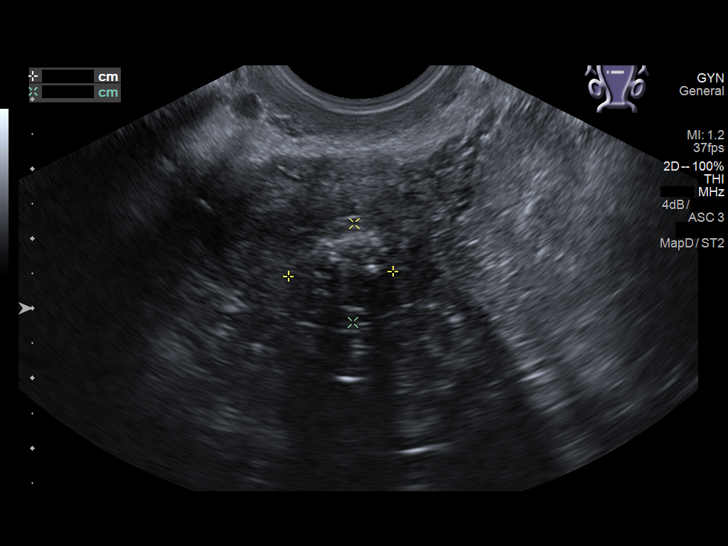
[im 50/60]
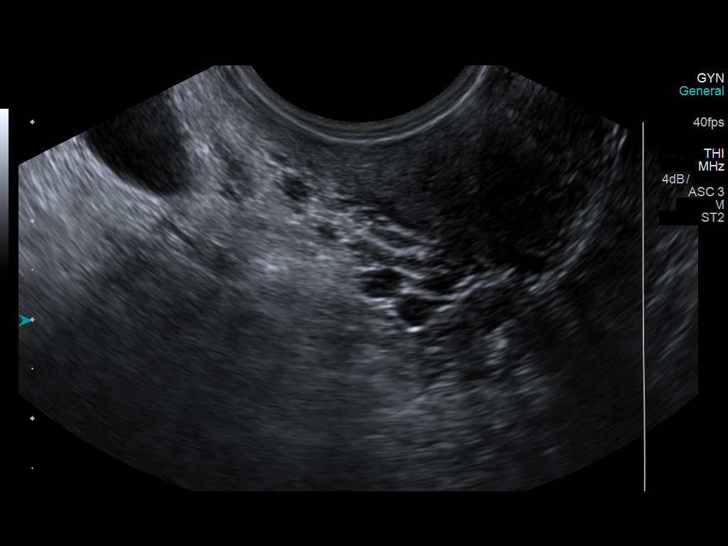
[im 55/60]
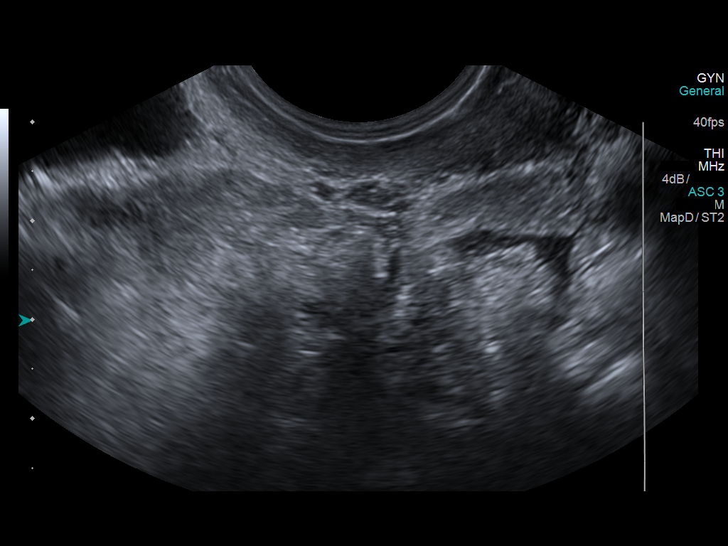
[im 60/60]
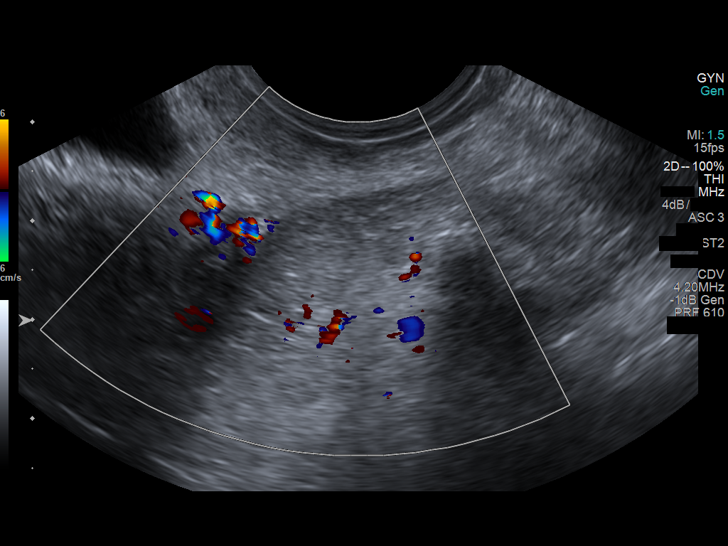

[13 of 25 positions shown; findings below may reference images not displayed]

FINDINGS: Uterus

Measurements: 6.6 x 2.0 x 3.9 cm. A partially calcified uterine
fibroid is seen in the posterior corpus measuring 2.3 cm in maximum
diameter. This shows mild increase in size from 1.4 cm on [WD]
ultrasound. No other fibroids identified.

Endometrium

Thickness: 3 mm.  No focal abnormality visualized.

Right ovary

Measurements: Not directly visualized, however no adnexal mass
identified.

Left ovary

Measurements: Not directly visualized, however no adnexal mass
identified.

Other findings

No abnormal free fluid.
IMPRESSION: 2.3 cm partially calcified posterior uterine fibroid, mildly
increased in size since [WD] ultrasound.

Nonvisualization of ovaries, however no adnexal mass identified.

## 2018-06-26 ENCOUNTER — Emergency Department
Admission: EM | Admit: 2018-06-26 | Discharge: 2018-06-26 | Disposition: A | Discharge status: Home / Self Care | Payer: Medicare Other | Attending: Emergency Medicine | Admitting: Emergency Medicine

## 2018-06-26 ENCOUNTER — Encounter: Payer: Self-pay | Admitting: Emergency Medicine

## 2018-06-26 ENCOUNTER — Other Ambulatory Visit: Payer: Self-pay

## 2018-06-26 ENCOUNTER — Emergency Department: Payer: Medicare Other

## 2018-06-26 DIAGNOSIS — R079 Chest pain, unspecified: Secondary | ICD-10-CM | POA: Insufficient documentation

## 2018-06-26 DIAGNOSIS — F17210 Nicotine dependence, cigarettes, uncomplicated: Secondary | ICD-10-CM | POA: Insufficient documentation

## 2018-06-26 LAB — CBC
HEMATOCRIT: 41.4 % (ref 36.0–46.0)
Hemoglobin: 13.3 g/dL (ref 12.0–15.0)
MCH: 28.5 pg (ref 26.0–34.0)
MCHC: 32.1 g/dL (ref 30.0–36.0)
MCV: 88.8 fL (ref 80.0–100.0)
PLATELETS: 365 10*3/uL (ref 150–400)
RBC: 4.66 MIL/uL (ref 3.87–5.11)
RDW: 13.3 % (ref 11.5–15.5)
WBC: 8.6 10*3/uL (ref 4.0–10.5)
nRBC: 0 % (ref 0.0–0.2)

## 2018-06-26 LAB — BASIC METABOLIC PANEL
Anion gap: 9 (ref 5–15)
BUN: 16 mg/dL (ref 8–23)
CALCIUM: 9 mg/dL (ref 8.9–10.3)
CHLORIDE: 104 mmol/L (ref 98–111)
CO2: 25 mmol/L (ref 22–32)
Creatinine, Ser: 0.54 mg/dL (ref 0.44–1.00)
GFR calc Af Amer: >60 mL/min (ref >60)
GFR calc non Af Amer: >60 mL/min (ref >60)
GLUCOSE: 107 mg/dL — AB (ref 70–99)
Potassium: 4.4 mmol/L (ref 3.5–5.1)
Sodium: 138 mmol/L (ref 135–145)

## 2018-06-26 LAB — TROPONIN I: Troponin I: <0.03 ng/mL (ref <0.03)

## 2018-06-26 IMAGING — CR CHEST - 2 VIEW
2 series · 2 of 2 positions shown · non-contrast
Comparison: None.

CLINICAL DATA: Chest pain and bilateral jaw pain. Nausea last
night.

EXAM:
CHEST - 2 VIEW

[chest pa]
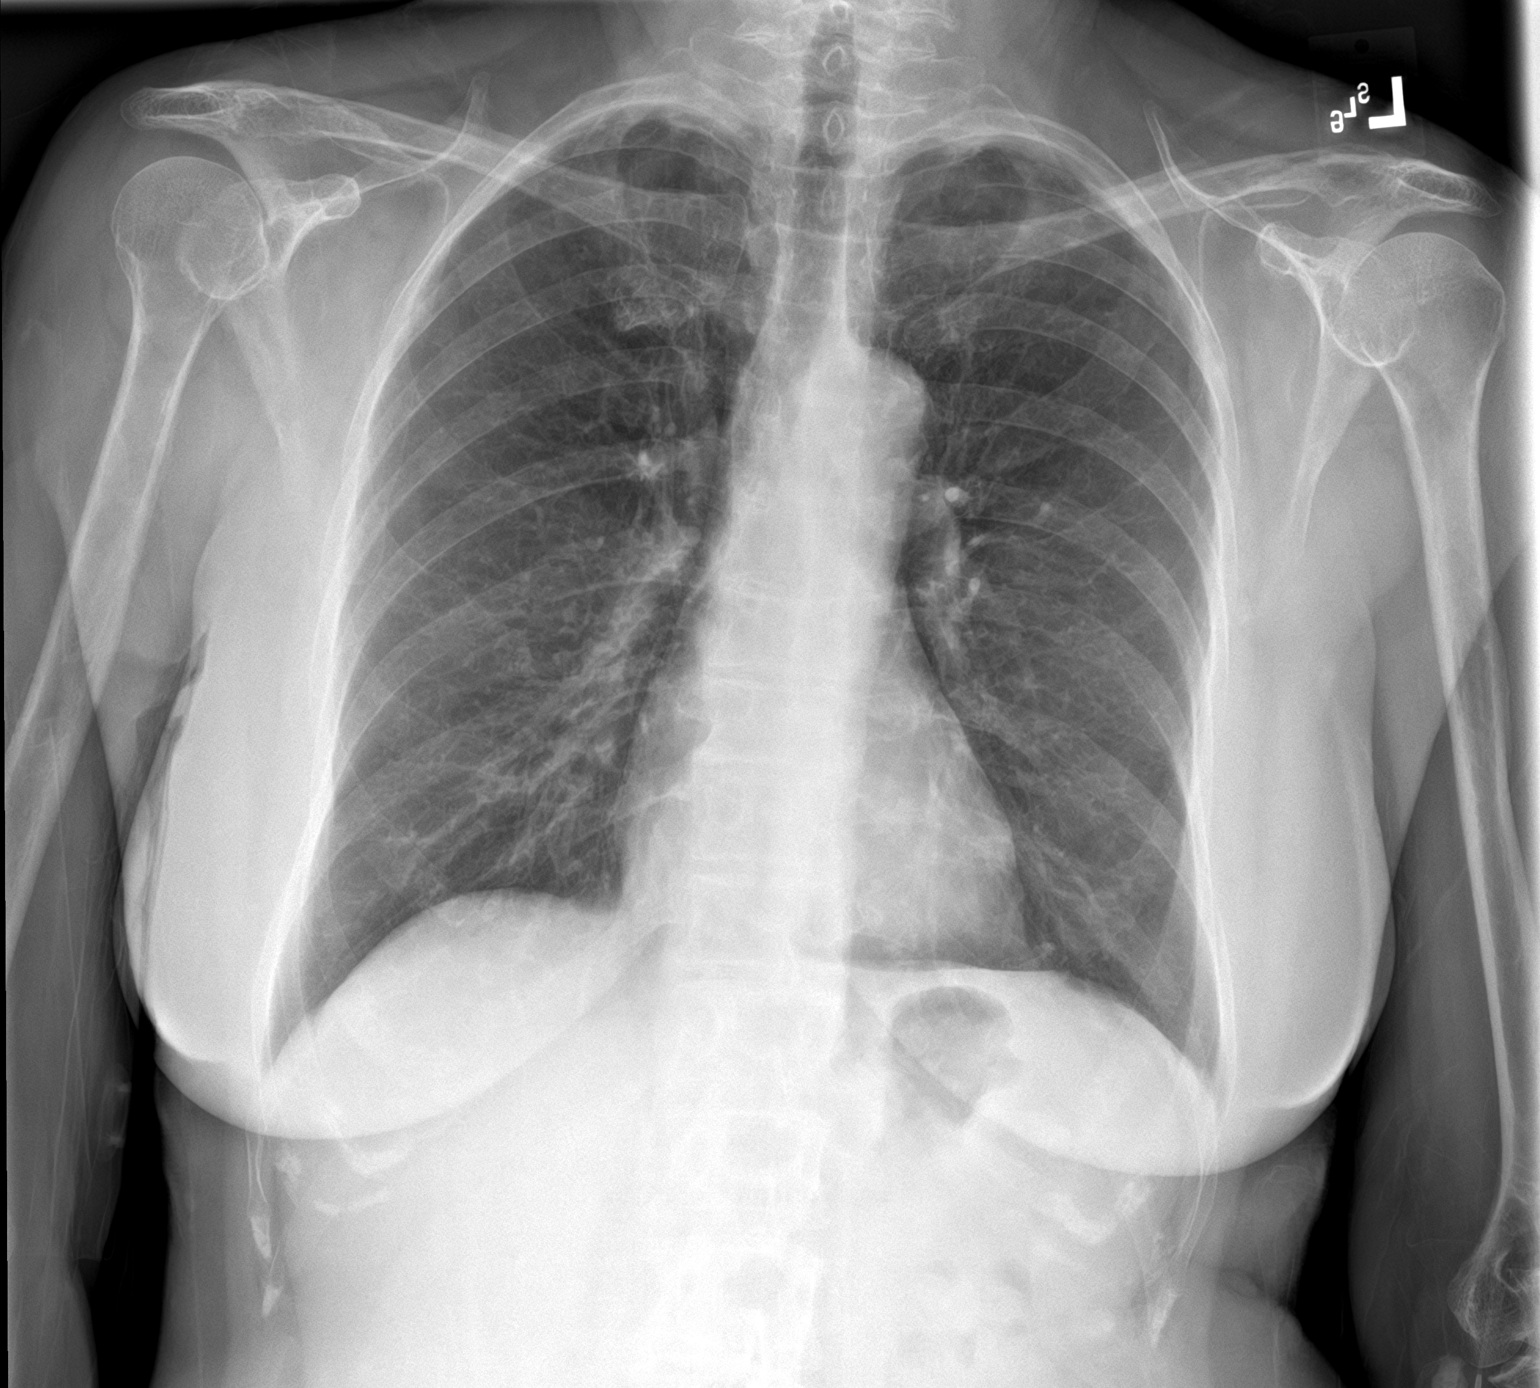

[chest lat]
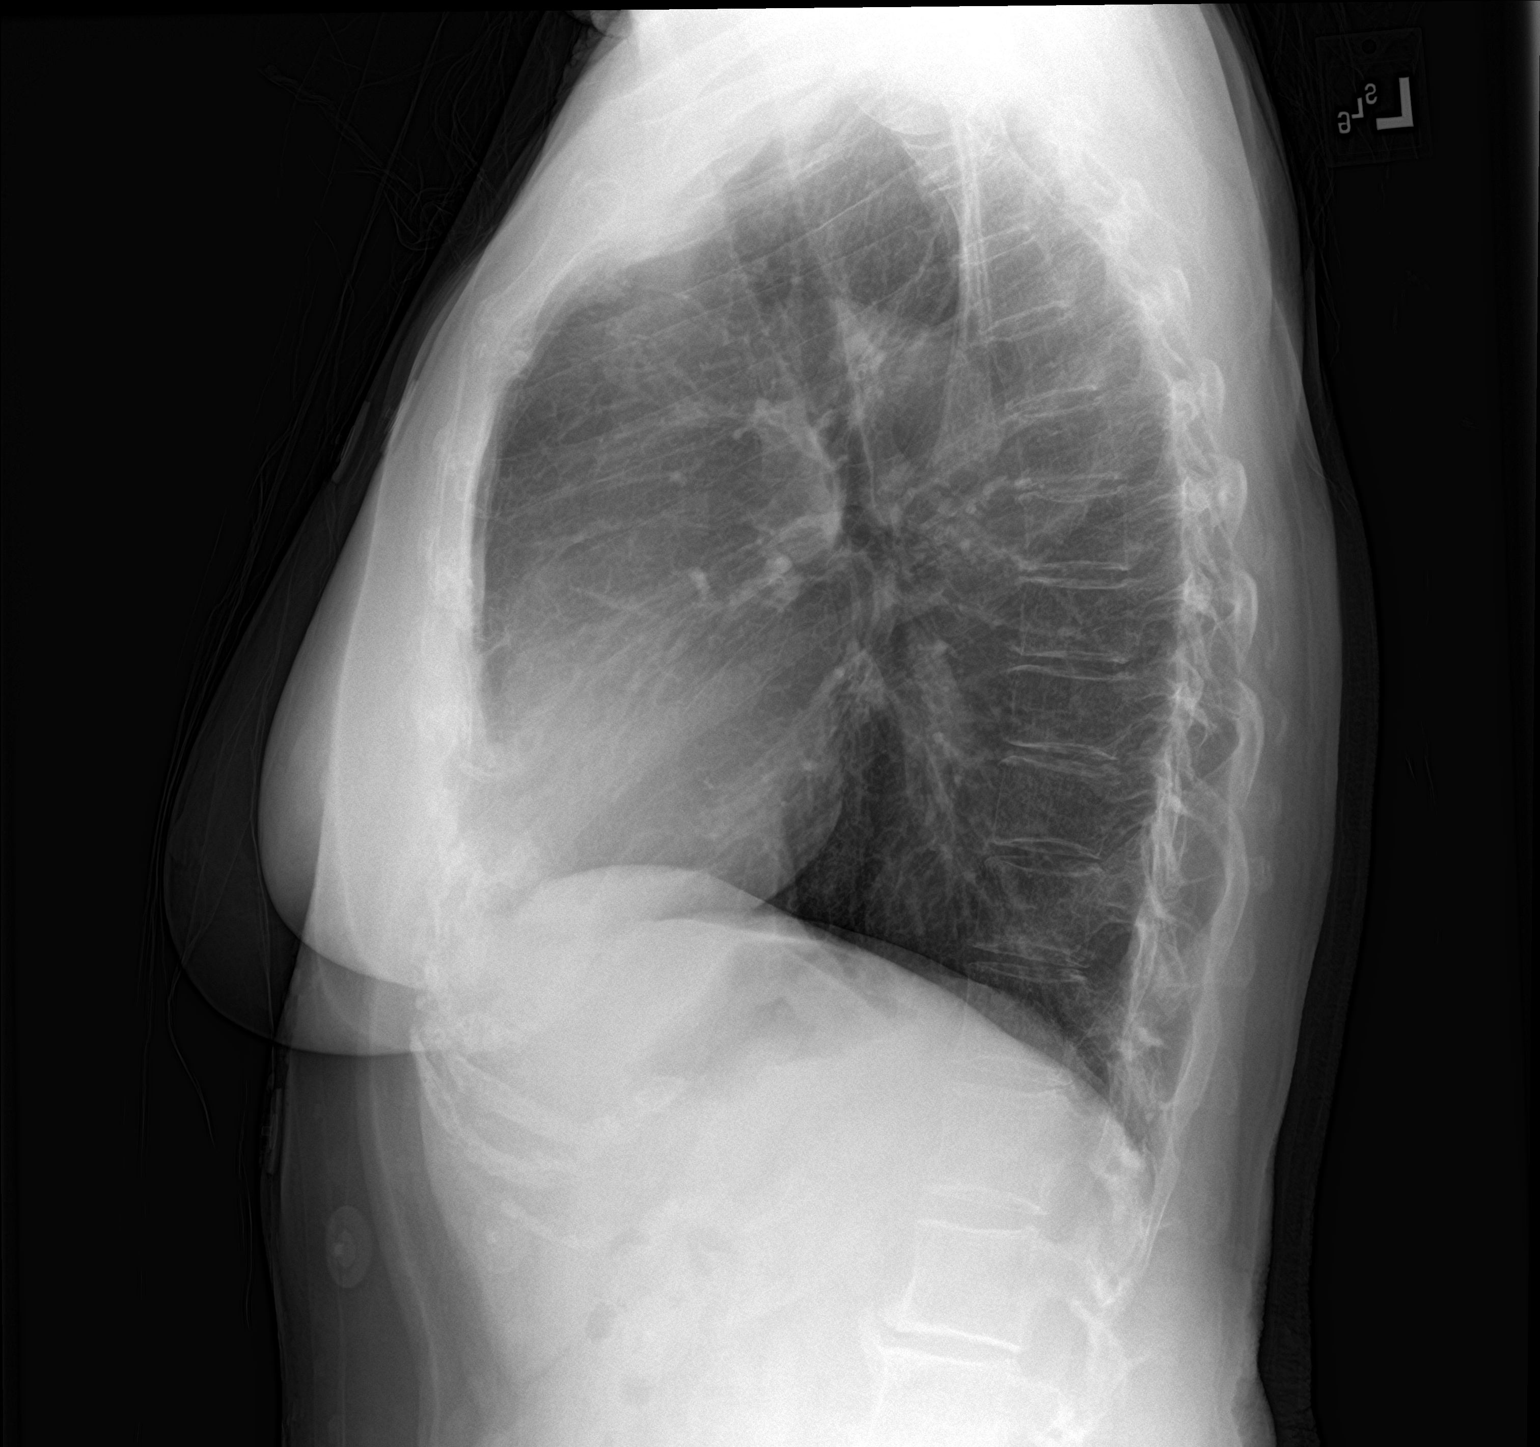

[2 of 2 positions shown; findings below may reference images not displayed]

FINDINGS: The heart size and mediastinal contours are normal. There is mild
aortic atherosclerosis. There is mild scarring at both lung apices.
No edema, confluent airspace opacity, pleural effusion or
pneumothorax. The bones appear unremarkable.
IMPRESSION: No evidence of active cardiopulmonary process. Mild biapical
pulmonary scarring.

## 2018-06-26 MED ORDER — SODIUM CHLORIDE 0.9% FLUSH: 3.0000 mL | Freq: Once | INTRAVENOUS | Status: DC

## 2018-06-26 NOTE — ED Triage Notes (Signed)
Chest pain an hour ago with bilat jaw pain.  Nausea last night.  Was brought via w/c from [place]
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
## 2018-06-26 NOTE — Discharge Instructions (Signed)
Please return to the emergency department if you develop severe pain, lightheadedness or fainting, palpitations, shortness of breath, or any other symptoms concerning to you.
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
## 2018-06-26 NOTE — ED Provider Notes (Signed)
[facility] Emergency Department Provider Note       Time seen: ----------------------------------------- 2:30 PM on 06/26/2018 -----------------------------------------   I have reviewed the triage vital signs and the nursing notes.  HISTORY   Chief Complaint Chest Pain    HPI Olivia Alvarado is a 68 y.o. female with a history of IBS, skin cancer, UTI who presents to the ED for sudden onset chest pain that started today and lasted approximately an hour.  She states the pain is in both jaws as well, she had nausea last night.  Patient was brought in via wheelchair from [facility] acute care.  Past Medical History:  Diagnosis Date  . Atrophic vaginitis   . IBS (irritable bowel syndrome)   . Lower back pain   . Nocturia   . OAB (overactive bladder)   . Skin cancer    BCC  . Squamous carcinoma   . Suprapubic pressure   . Urethral stricture   . Urinary frequency   . Urinary incontinence, nocturnal enuresis   . Vaginal atrophy   . Vulvar lesion     Patient Active Problem List   Diagnosis Date Noted  . Microscopic hematuria 08/02/2015  . Adnexal pain 08/02/2015  . Mild episode of recurrent major depressive disorder ([place]) 03/12/2015  . Controlled type 2 diabetes mellitus without complication ([place]) 16/01/9603  . BP (high blood pressure) 12/04/2013  . 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 SKIN CANCER EXCISION      Allergies Patient has no known allergies.  Social History Social History   Tobacco Use  . Smoking status: Current Every Day Smoker    Packs/day: 0.50    Years: 40.00    Pack years: 20.00    Types: Cigarettes, E-cigarettes  . Smokeless tobacco: Never Used  Substance Use Topics  . Alcohol use: No  . Drug use: No   Review of Systems Constitutional: Negative for fever. Cardiovascular: Positive for chest pain Respiratory: Negative for shortness of  breath. Gastrointestinal: Negative for abdominal pain, vomiting and diarrhea. Musculoskeletal: Negative for back pain. Skin: Negative for rash. Neurological: Negative for headaches, focal weakness or numbness.  All systems negative/normal/unremarkable except as stated in the HPI  ____________________________________________   PHYSICAL EXAM:  VITAL SIGNS: ED Triage Vitals  Enc Vitals Group     BP 06/26/18 1421 (!) 165/77     Pulse Rate 06/26/18 1420 77     Resp 06/26/18 1420 16     Temp 06/26/18 1420 97.8 F (36.6 C)     Temp Source 06/26/18 1420 Oral     SpO2 06/26/18 1420 98 %     Weight --      Height --      Head Circumference --      Peak Flow --      Pain Score 06/26/18 1421 0     Pain Loc --      Pain Edu? --      Excl. in [place]? --    Constitutional: Alert and oriented. Well appearing and in no distress. Eyes: Conjunctivae are normal. Normal extraocular movements. ENT      Head: Normocephalic and atraumatic.      Nose: No congestion/rhinnorhea.      Mouth/Throat: Mucous membranes are moist.      Neck: No stridor. Cardiovascular: Normal rate, regular rhythm. No murmurs, rubs, or gallops. Respiratory: Normal respiratory effort without tachypnea nor retractions. Breath sounds are clear and equal  bilaterally. No wheezes/rales/rhonchi. Gastrointestinal: Soft and nontender. Normal bowel sounds Musculoskeletal: Nontender with normal range of motion in extremities. No lower extremity tenderness nor edema. Neurologic:  Normal speech and language. No gross focal neurologic deficits are appreciated.  Skin:  Skin is warm, dry and intact. No rash noted. Psychiatric: Mood and affect are normal. Speech and behavior are normal.  ____________________________________________  EKG: Interpreted by me.  Sinus rhythm rate of 76 bpm, right superior axis, normal QT  ____________________________________________  ED COURSE:  As part of my medical decision making, I reviewed the  following data within the [place] History obtained from family if available, nursing notes, old chart and ekg, as well as notes from prior ED visits. Patient presented for nonspecific chest pain, we will assess with labs and imaging as indicated at this time.   Procedures ____________________________________________   LABS (pertinent positives/negatives)  Labs Reviewed  BASIC METABOLIC PANEL - Abnormal; Notable for the following components:      Result Value   Glucose, Bld 107 (*)    All other components within normal limits  CBC  TROPONIN I    RADIOLOGY  Chest x-ray IMPRESSION: No evidence of active cardiopulmonary process. Mild biapical pulmonary scarring. ____________________________________________   DIFFERENTIAL DIAGNOSIS   Unstable angina, MI, PE, dissection, GERD, musculoskeletal pain, anxiety  FINAL ASSESSMENT AND PLAN  Nonspecific chest pain   Plan: The patient had presented for nonspecific chest pain. Patient's labs are unremarkable. Patient's imaging was negative.  Repeat troponin is pending at this time.  Anticipate outpatient follow-up with cardiology.   Laurence Aly, MD    Note: This note was generated in part or whole with voice recognition software. Voice recognition is usually quite accurate but there are transcription errors that can and very often do occur. I apologize for any typographical errors that were not detected and corrected.     Earleen Newport, MD 06/26/18 [phone]

## 2018-12-20 ENCOUNTER — Other Ambulatory Visit: Payer: Self-pay | Admitting: Family Medicine

## 2018-12-20 DIAGNOSIS — Z1231 Encounter for screening mammogram for malignant neoplasm of breast: Secondary | ICD-10-CM

## 2019-01-02 ENCOUNTER — Ambulatory Visit
Admission: RE | Admit: 2019-01-02 | Discharge: 2019-01-02 | Disposition: A | Discharge status: Home / Self Care | Payer: Medicare Other | Source: Ambulatory Visit | Attending: Family Medicine | Admitting: Family Medicine

## 2019-01-02 ENCOUNTER — Other Ambulatory Visit: Payer: Self-pay

## 2019-01-02 DIAGNOSIS — Z1231 Encounter for screening mammogram for malignant neoplasm of breast: Secondary | ICD-10-CM | POA: Diagnosis present

## 2019-01-02 IMAGING — MG MM DIGITAL SCREENING BILAT W/ TOMO W/ CAD
8 series · 8 of 24 positions shown · non-contrast
Comparison: Previous exam(s).

CLINICAL DATA: Screening.

EXAM:
DIGITAL SCREENING BILATERAL MAMMOGRAM WITH TOMO AND CAD

[R CC synth-2D]
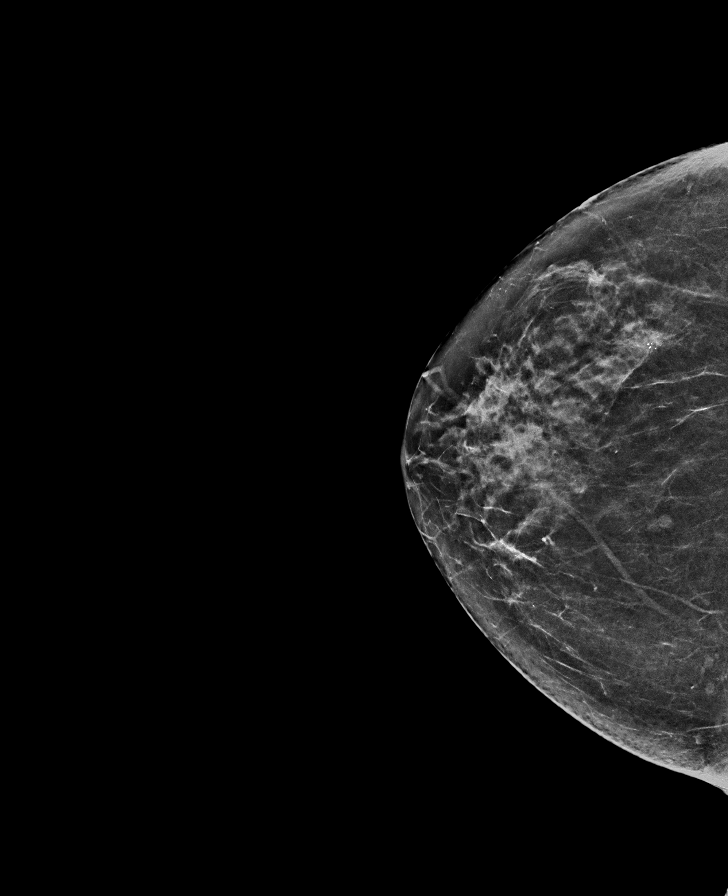

[R MLO synth-2D]
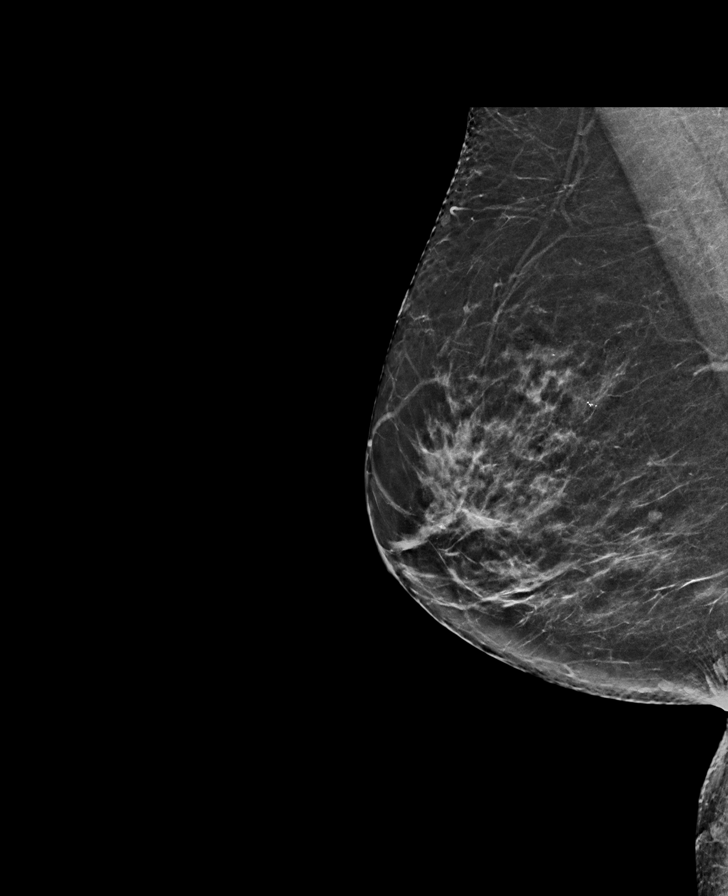

[L CC synth-2D]
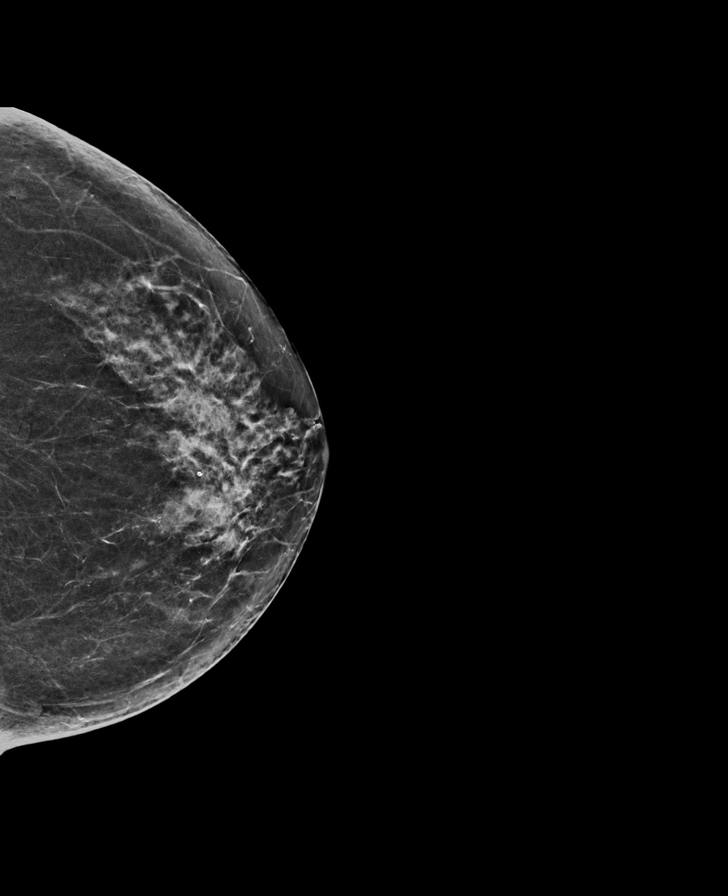

[L MLO synth-2D]
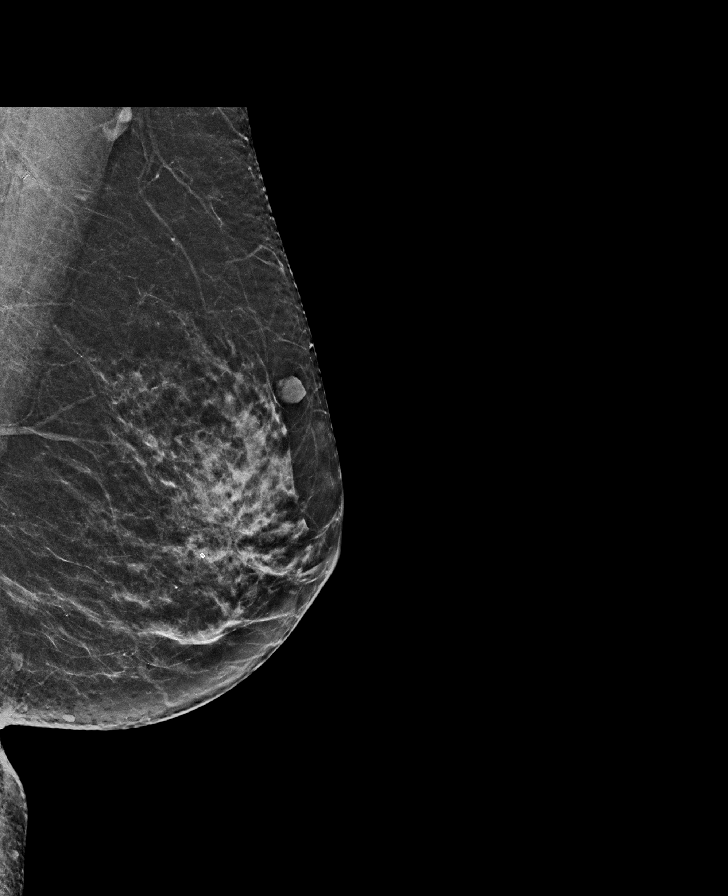

[L MLO tomo · tomo slice 31/62.0]
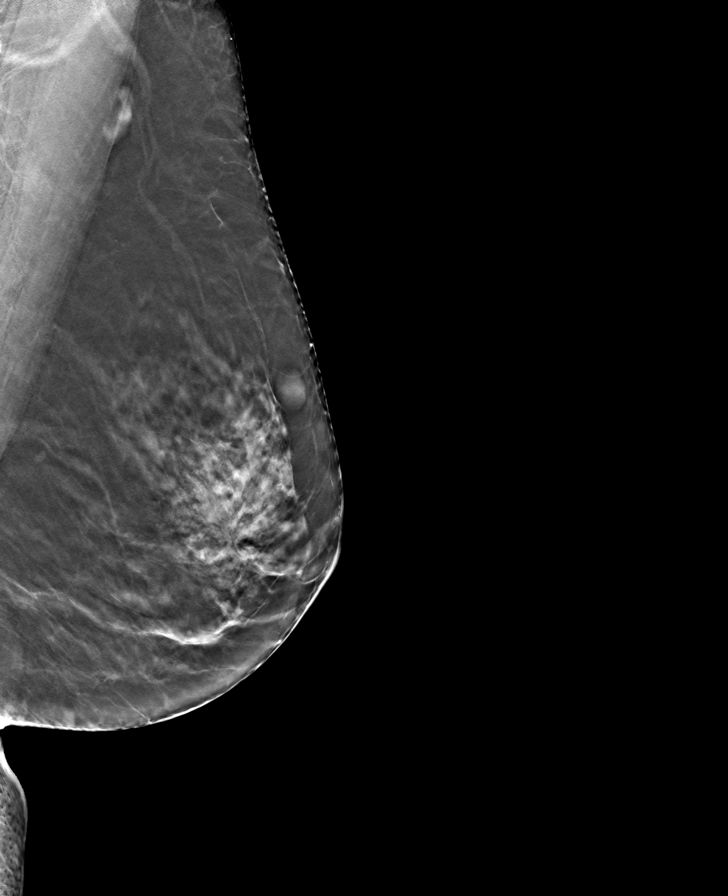

[R MLO tomo · tomo slice 35/68.0]
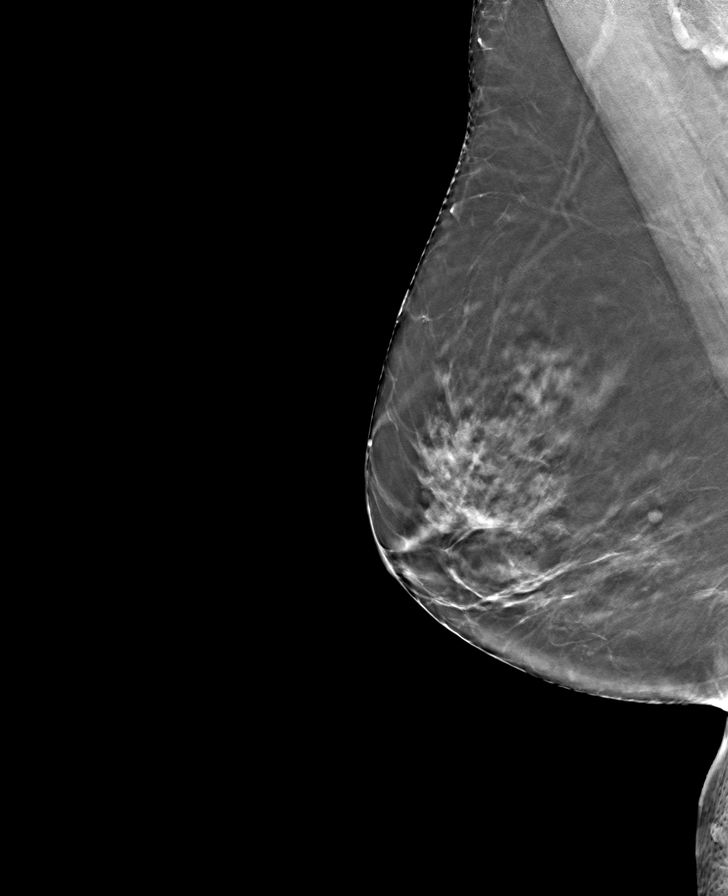

[R CC tomo · tomo slice 33/66.0]
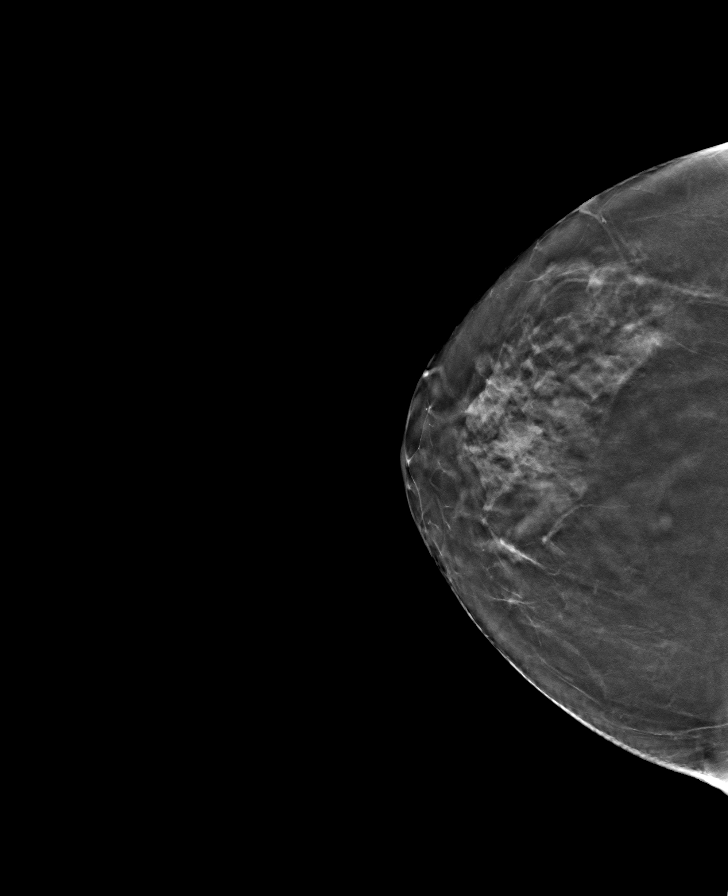

[L CC tomo · tomo slice 32/63.0]
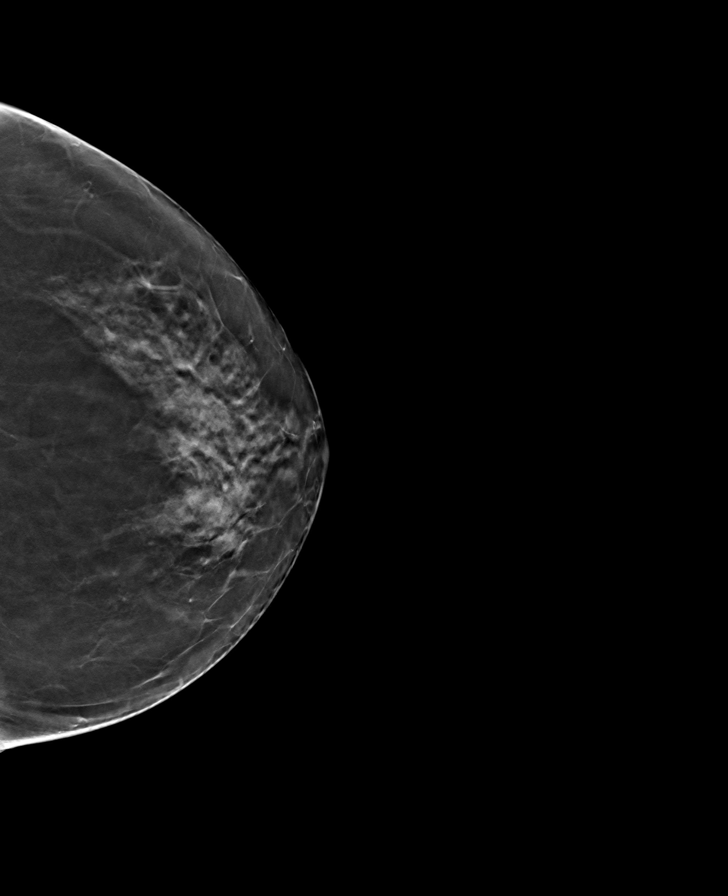

[8 of 24 positions shown; findings below may reference images not displayed]

ACR Breast Density Category c: The breast tissue is heterogeneously
dense, which may obscure small masses.
FINDINGS: There are no findings suspicious for malignancy. Images were
processed with CAD.
IMPRESSION: No mammographic evidence of malignancy. A result letter of this
screening mammogram will be mailed directly to the patient.

RECOMMENDATION:
Screening mammogram in one year. (Code:[5V])

BI-RADS CATEGORY  1: Negative.

## 2019-01-03 DIAGNOSIS — R399 Unspecified symptoms and signs involving the genitourinary system: Secondary | ICD-10-CM | POA: Insufficient documentation

## 2019-12-12 ENCOUNTER — Other Ambulatory Visit: Payer: Self-pay | Admitting: Family Medicine

## 2019-12-12 DIAGNOSIS — Z1231 Encounter for screening mammogram for malignant neoplasm of breast: Secondary | ICD-10-CM

## 2020-01-06 ENCOUNTER — Other Ambulatory Visit: Payer: Self-pay

## 2020-01-06 ENCOUNTER — Ambulatory Visit: Payer: Medicare Other | Attending: Internal Medicine

## 2020-01-06 ENCOUNTER — Ambulatory Visit
Admission: RE | Admit: 2020-01-06 | Discharge: 2020-01-06 | Disposition: A | Discharge status: Home / Self Care | Payer: Medicare Other | Source: Ambulatory Visit | Attending: Family Medicine | Admitting: Family Medicine

## 2020-01-06 DIAGNOSIS — Z23 Encounter for immunization: Secondary | ICD-10-CM

## 2020-01-06 DIAGNOSIS — Z1231 Encounter for screening mammogram for malignant neoplasm of breast: Secondary | ICD-10-CM | POA: Insufficient documentation

## 2020-01-06 IMAGING — MG DIGITAL SCREENING BILAT W/ TOMO W/ CAD
8 series · 8 of 24 positions shown · non-contrast
Comparison: Previous exam(s).

CLINICAL DATA: Screening.

EXAM:
DIGITAL SCREENING BILATERAL MAMMOGRAM WITH TOMO AND CAD

[R CC synth-2D]
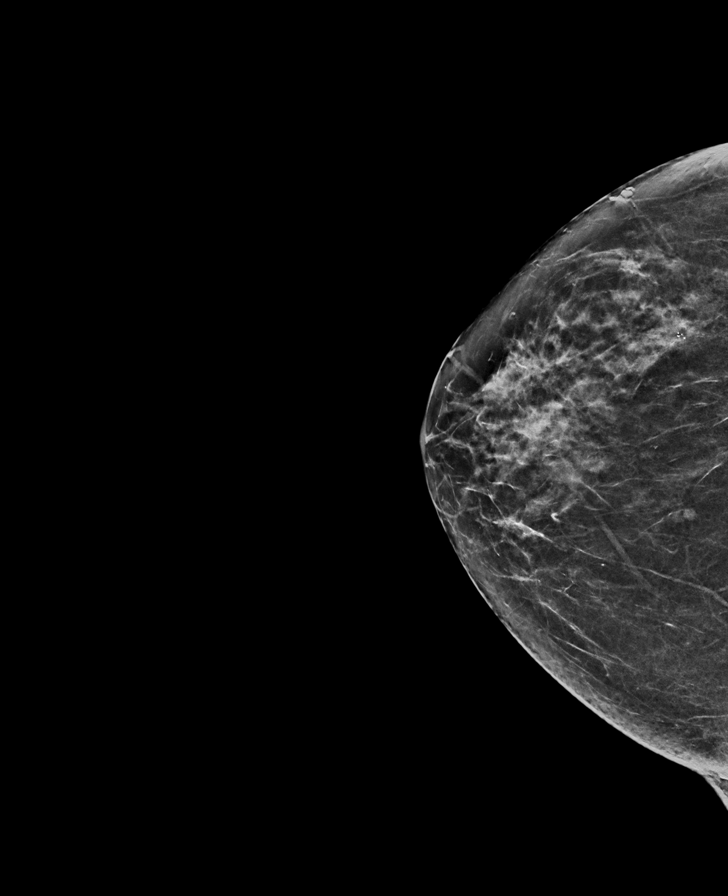

[R MLO synth-2D]
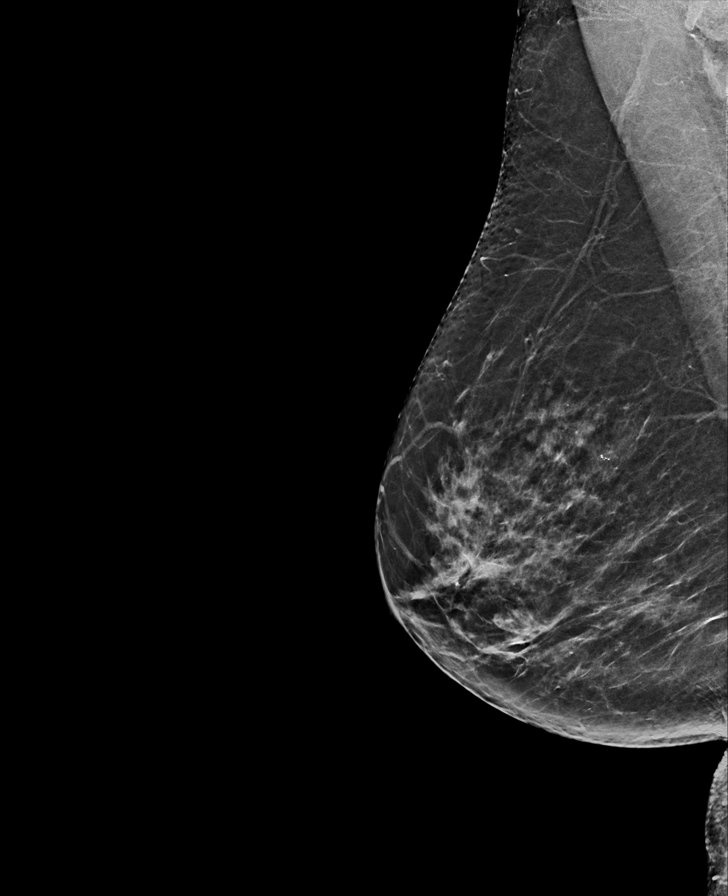

[L MLO synth-2D]
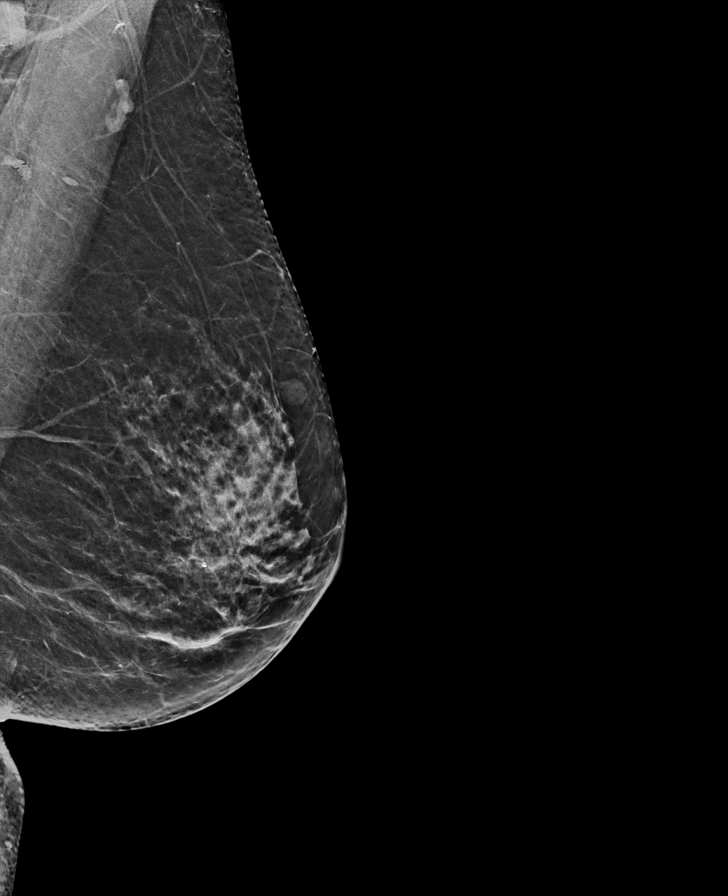

[L CC synth-2D]
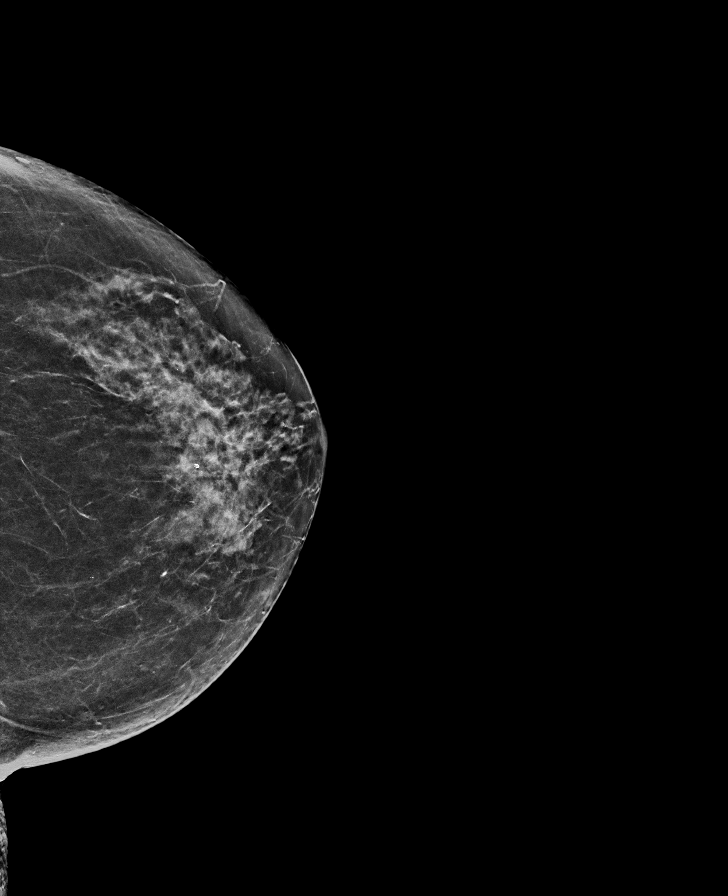

[L CC tomo · tomo slice 31/62.0]
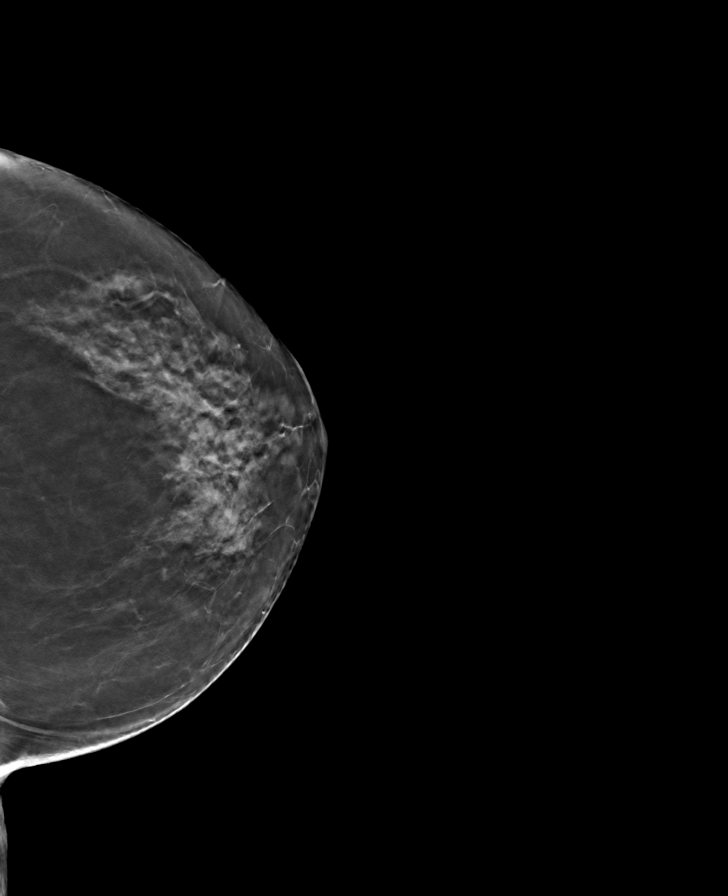

[R MLO tomo · tomo slice 33/65.0]
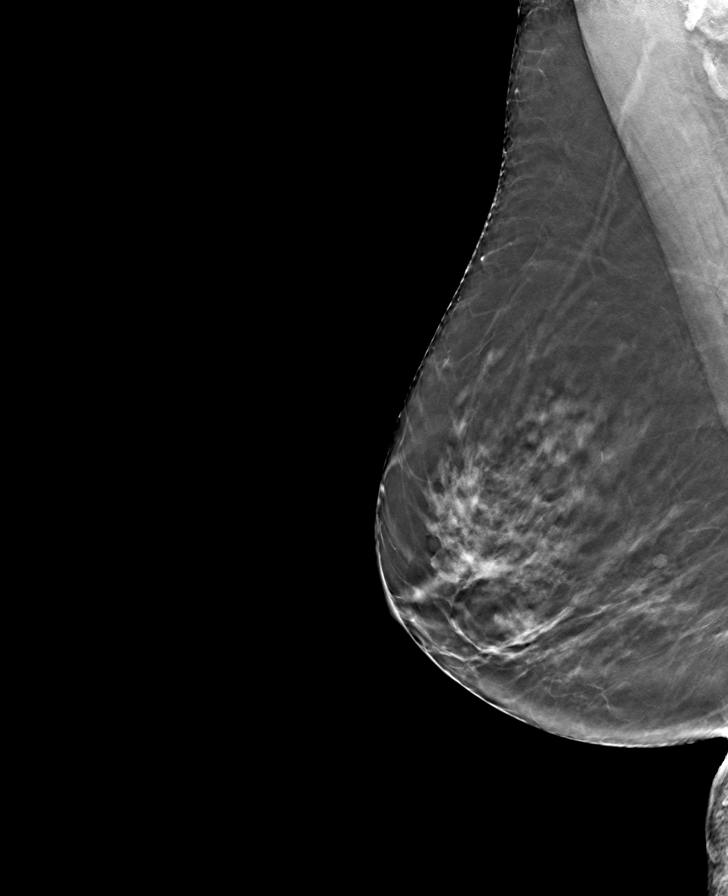

[R CC tomo · tomo slice 31/62.0]
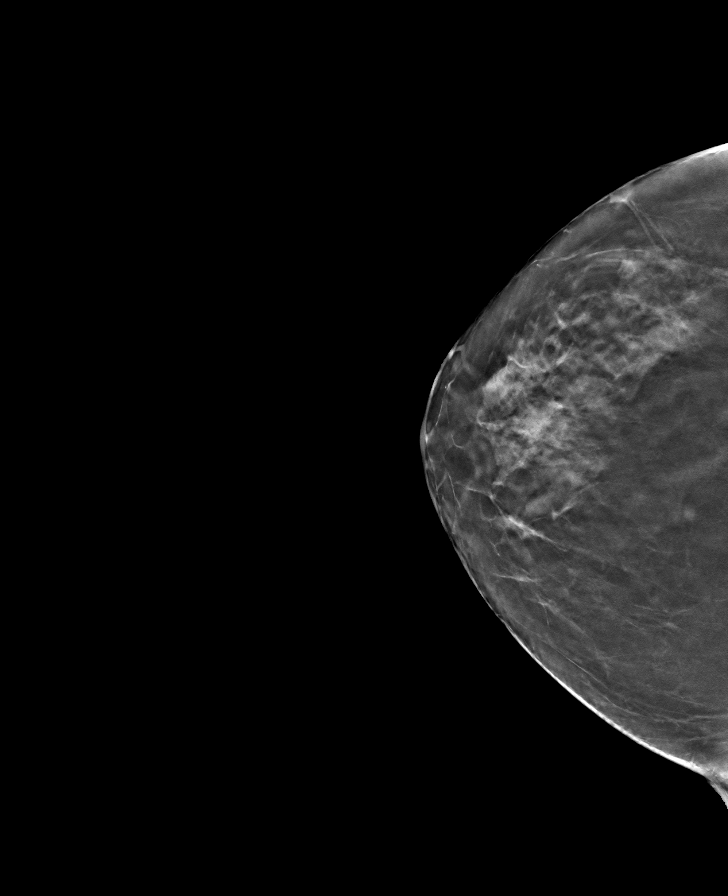

[L MLO tomo · tomo slice 31/61.0]
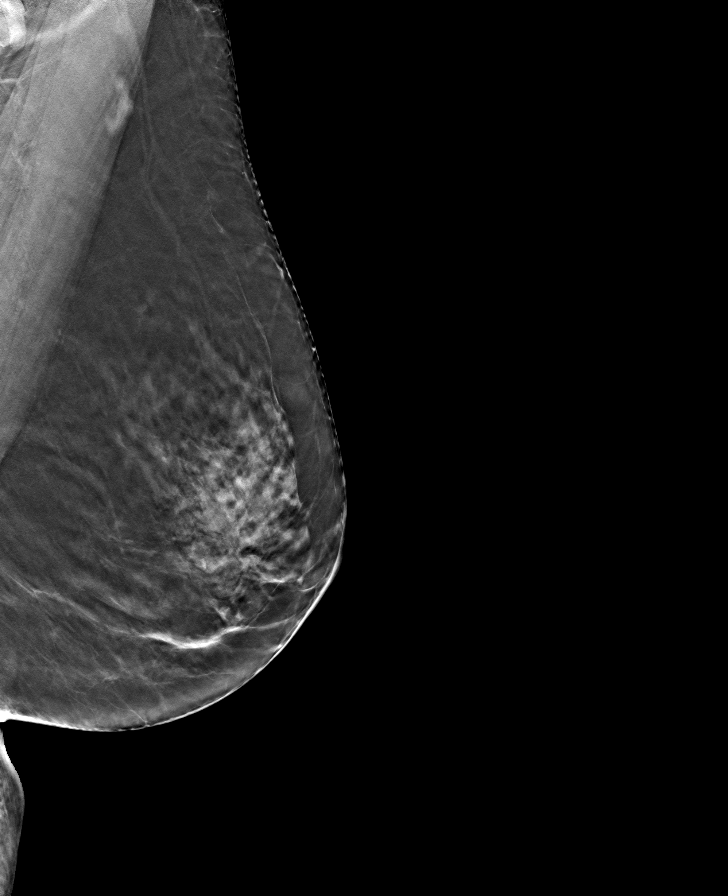

[8 of 24 positions shown; findings below may reference images not displayed]

ACR Breast Density Category c: The breast tissue is heterogeneously
dense, which may obscure small masses.
FINDINGS: There are no findings suspicious for malignancy. Images were
processed with CAD.
IMPRESSION: No mammographic evidence of malignancy. A result letter of this
screening mammogram will be mailed directly to the patient.

RECOMMENDATION:
Screening mammogram in one year. (Code:[5V])

BI-RADS CATEGORY  1: Negative.

## 2020-01-06 NOTE — Progress Notes (Signed)
   Covid-19 Vaccination Clinic  Name:  Olivia Alvarado    MRN: [id number] DOB: 19-May-1950  01/06/2020  [name] was observed post Covid-19 immunization for 15 minutes without incident. She was provided with Vaccine Information Sheet and instruction to access the V-Safe system.   [name] was instructed to call [phone] with any severe reactions post vaccine: Marland Kitchen Difficulty breathing  . Swelling of face and throat  . A fast heartbeat  . A bad rash all over body  . Dizziness and weakness

## 2020-08-06 ENCOUNTER — Emergency Department: Payer: Medicare Other

## 2020-08-06 ENCOUNTER — Other Ambulatory Visit: Payer: Self-pay

## 2020-08-06 ENCOUNTER — Emergency Department
Admission: EM | Admit: 2020-08-06 | Discharge: 2020-08-06 | Disposition: A | Discharge status: Home / Self Care | Payer: Medicare Other | Attending: Emergency Medicine | Admitting: Emergency Medicine

## 2020-08-06 DIAGNOSIS — F17210 Nicotine dependence, cigarettes, uncomplicated: Secondary | ICD-10-CM | POA: Insufficient documentation

## 2020-08-06 DIAGNOSIS — M545 Low back pain, unspecified: Secondary | ICD-10-CM | POA: Insufficient documentation

## 2020-08-06 DIAGNOSIS — R109 Unspecified abdominal pain: Secondary | ICD-10-CM | POA: Insufficient documentation

## 2020-08-06 DIAGNOSIS — Z79899 Other long term (current) drug therapy: Secondary | ICD-10-CM | POA: Diagnosis not present

## 2020-08-06 DIAGNOSIS — R10A0 Flank pain, unspecified side: Secondary | ICD-10-CM

## 2020-08-06 LAB — CBC WITH DIFFERENTIAL/PLATELET
Abs Immature Granulocytes: 0.04 10*3/uL (ref 0.00–0.07)
Basophils Absolute: 0.1 10*3/uL (ref 0.0–0.1)
Basophils Relative: 1 %
Eosinophils Absolute: 0.2 10*3/uL (ref 0.0–0.5)
Eosinophils Relative: 2 %
HCT: 38.5 % (ref 36.0–46.0)
Hemoglobin: 12.6 g/dL (ref 12.0–15.0)
Immature Granulocytes: 0 %
Lymphocytes Relative: 20 %
Lymphs Abs: 2.1 10*3/uL (ref 0.7–4.0)
MCH: 29 pg (ref 26.0–34.0)
MCHC: 32.7 g/dL (ref 30.0–36.0)
MCV: 88.5 fL (ref 80.0–100.0)
Monocytes Absolute: 0.8 10*3/uL (ref 0.1–1.0)
Monocytes Relative: 7 %
Neutro Abs: 7.4 10*3/uL (ref 1.7–7.7)
Neutrophils Relative %: 70 %
Platelets: 356 10*3/uL (ref 150–400)
RBC: 4.35 MIL/uL (ref 3.87–5.11)
RDW: 12.6 % (ref 11.5–15.5)
WBC: 10.5 10*3/uL (ref 4.0–10.5)
nRBC: 0 % (ref 0.0–0.2)

## 2020-08-06 LAB — URINALYSIS, COMPLETE (UACMP) WITH MICROSCOPIC
Bacteria, UA: NONE SEEN
Bilirubin Urine: NEGATIVE
Glucose, UA: NEGATIVE mg/dL
Ketones, ur: NEGATIVE mg/dL
Nitrite: NEGATIVE
Protein, ur: NEGATIVE mg/dL
Specific Gravity, Urine: 1.018 (ref 1.005–1.030)
pH: 6 (ref 5.0–8.0)

## 2020-08-06 LAB — COMPREHENSIVE METABOLIC PANEL
ALT: 13 U/L (ref 0–44)
AST: 15 U/L (ref 15–41)
Albumin: 4.1 g/dL (ref 3.5–5.0)
Alkaline Phosphatase: 77 U/L (ref 38–126)
Anion gap: 10 (ref 5–15)
BUN: 19 mg/dL (ref 8–23)
CO2: 26 mmol/L (ref 22–32)
Calcium: 9.4 mg/dL (ref 8.9–10.3)
Chloride: 105 mmol/L (ref 98–111)
Creatinine, Ser: 0.52 mg/dL (ref 0.44–1.00)
GFR, Estimated: >60 mL/min (ref >60)
Glucose, Bld: 110 mg/dL — ABNORMAL HIGH (ref 70–99)
Potassium: 3.7 mmol/L (ref 3.5–5.1)
Sodium: 141 mmol/L (ref 135–145)
Total Bilirubin: 0.4 mg/dL (ref 0.3–1.2)
Total Protein: 6.9 g/dL (ref 6.5–8.1)

## 2020-08-06 IMAGING — CT CT RENAL STONE PROTOCOL
2 of 4 series · 16 of 46 positions shown, 18 images · non-contrast
Comparison: CT [DATE]

CLINICAL DATA: Back pain for 1 month

EXAM:
CT ABDOMEN AND PELVIS WITHOUT CONTRAST
TECHNIQUE: Multidetector CT imaging of the abdomen and pelvis was performed
following the standard protocol without IV contrast.

[Series 2: stone full standard · axial · 0.75mm/px · z∈[-418,-58]mm · 13 of 80 slices shown, 15 images]
[im 4/80  soft-tissue]
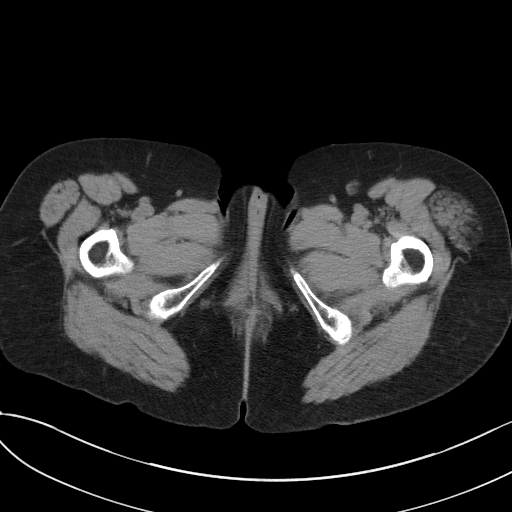
[im 4/80  bone]
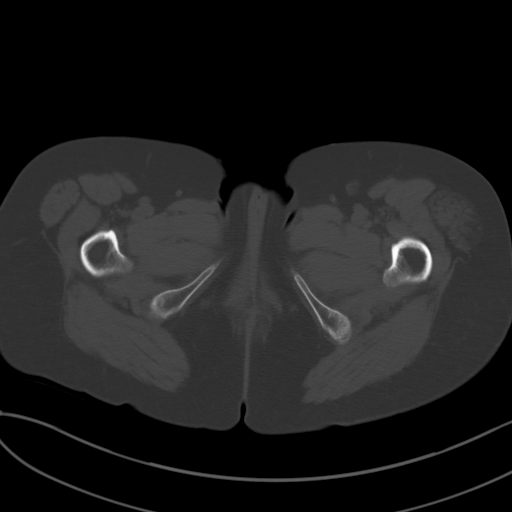
[im 12/80  soft-tissue]
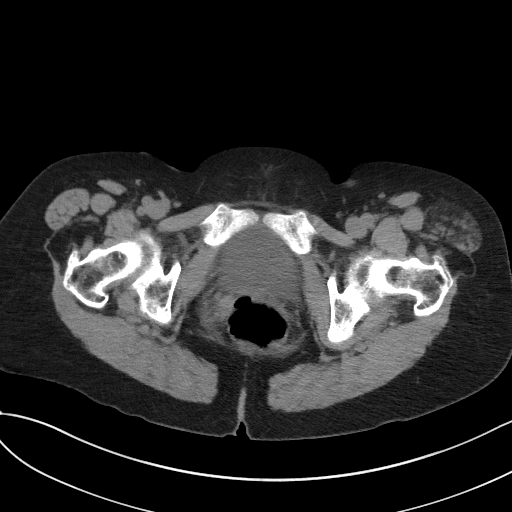
[im 16/80  soft-tissue]
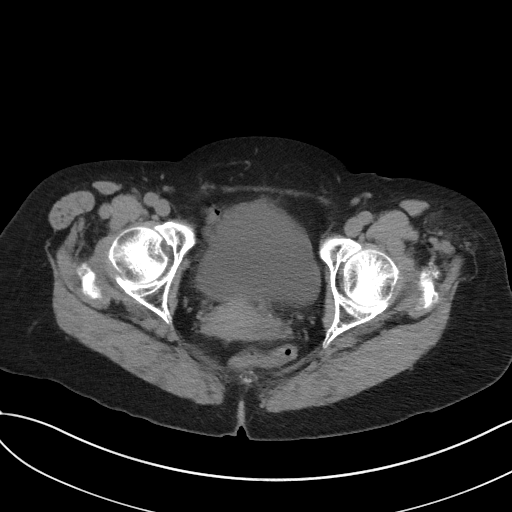
[im 23/80  soft-tissue]
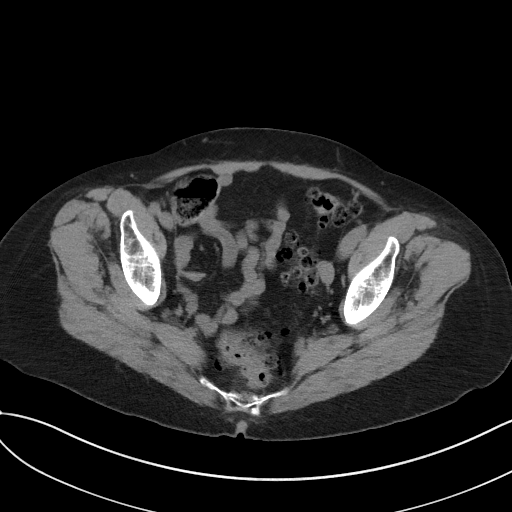
[im 27/80  soft-tissue]
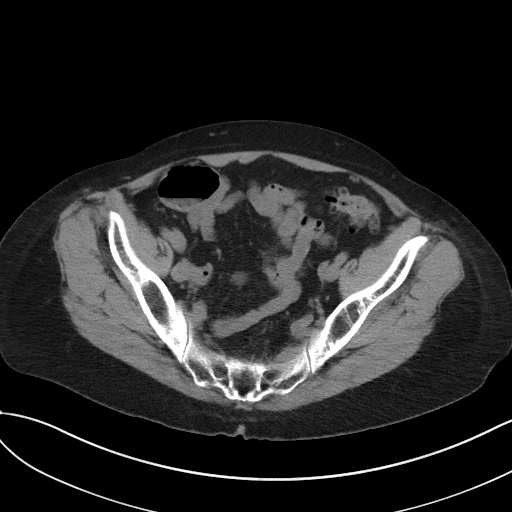
[im 34/80  soft-tissue]
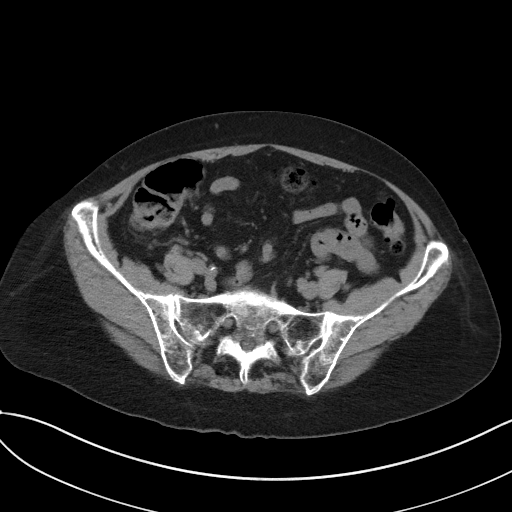
[im 42/80  soft-tissue]
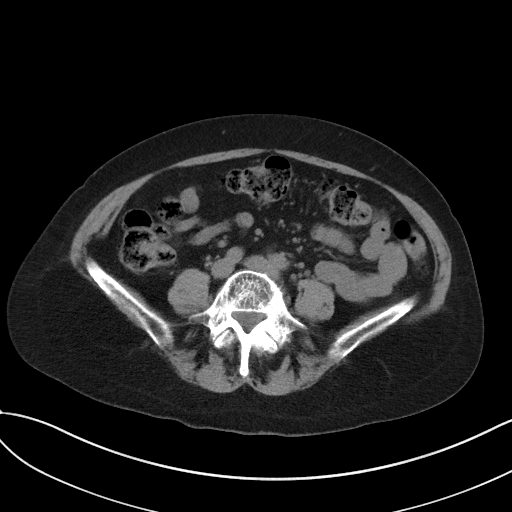
[im 46/80  soft-tissue]
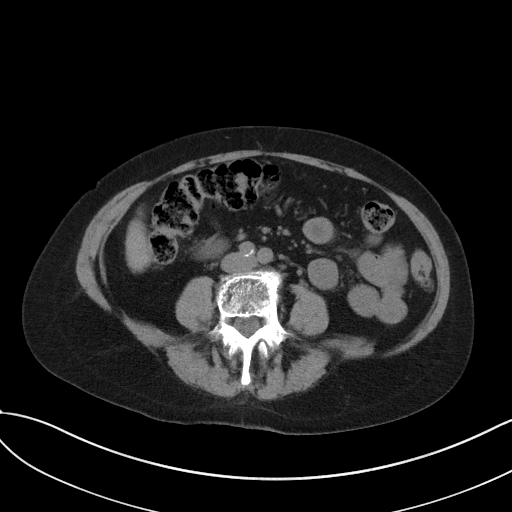
[im 53/80  soft-tissue]
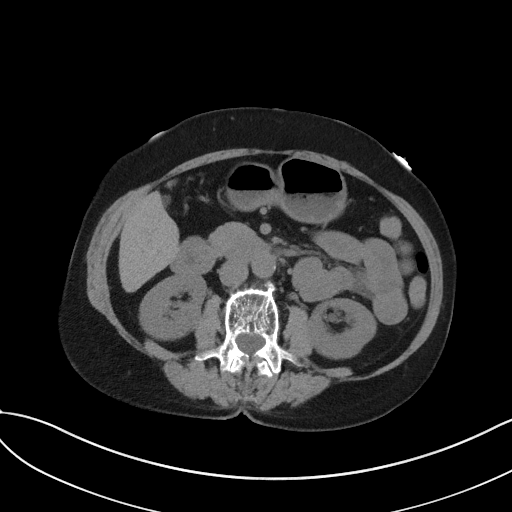
[im 53/80  bone]
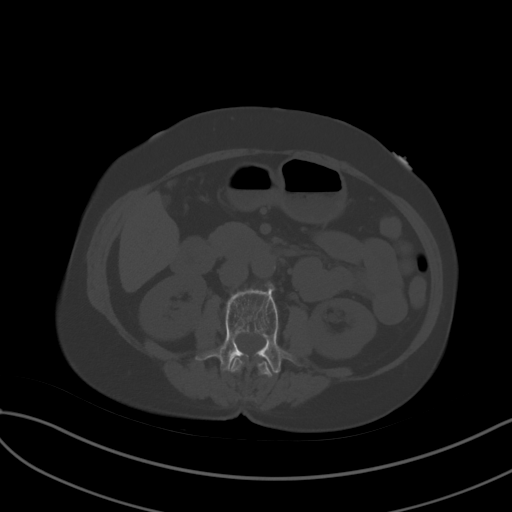
[im 57/80  soft-tissue]
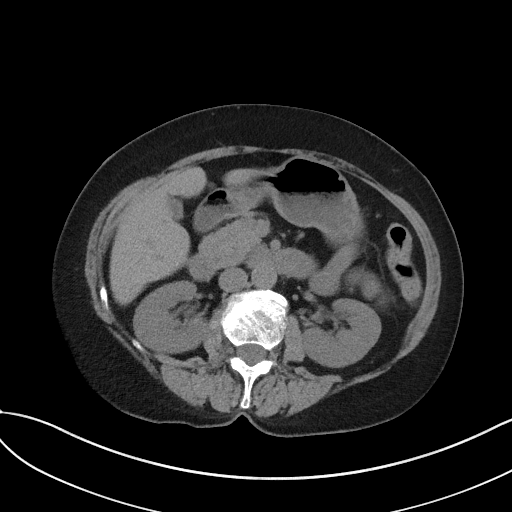
[im 64/80  soft-tissue]
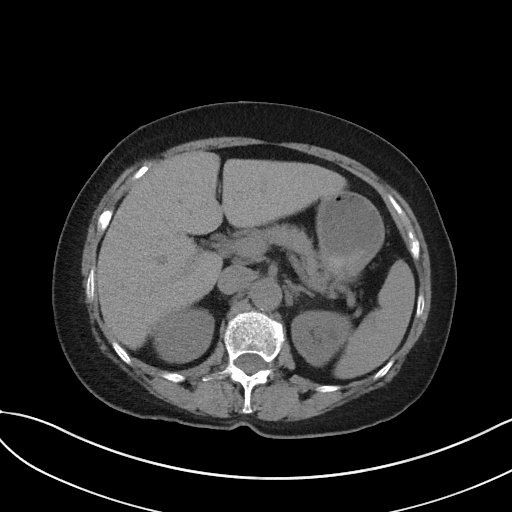
[im 68/80  soft-tissue]
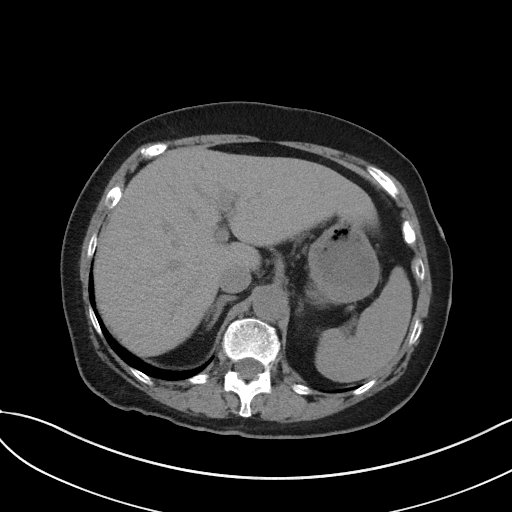
[im 76/80  soft-tissue]
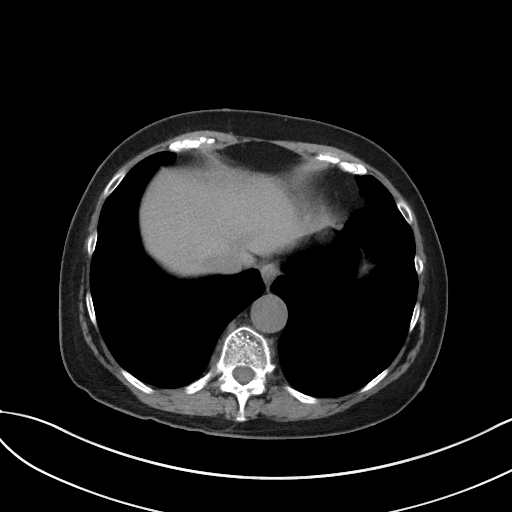

[Series 5: coronal · coronal · 0.60mm/px · 3 of 122 slices shown]
[im 41/122  soft-tissue]
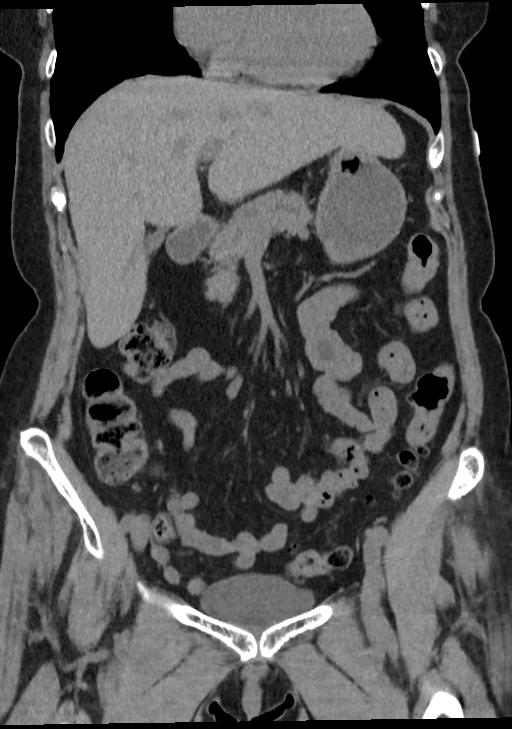
[im 54/122  soft-tissue]
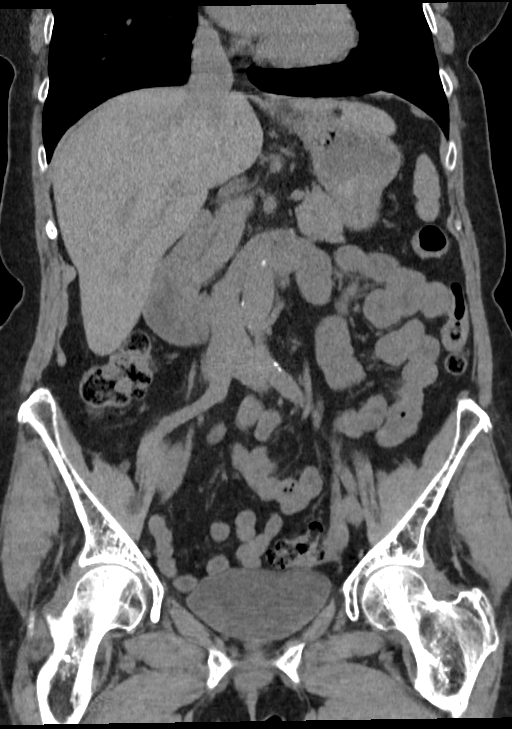
[im 68/122  soft-tissue]
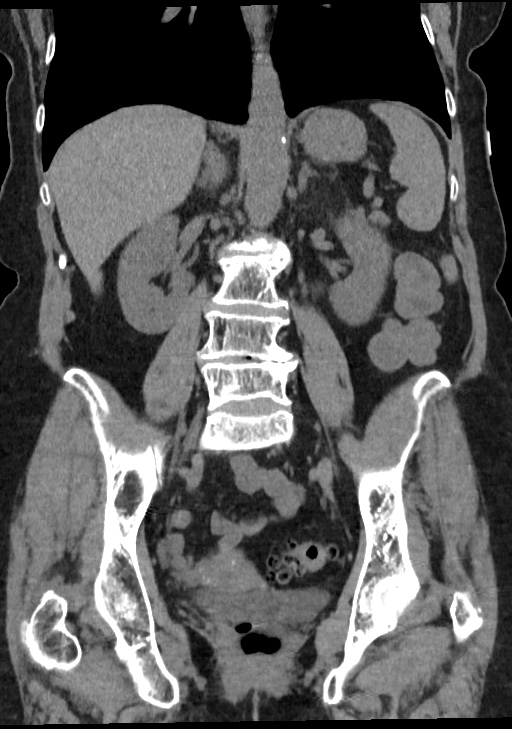

[16 of 46 positions shown; findings below may reference images not displayed]

FINDINGS: Lower chest: Lung bases demonstrate no acute consolidation or
effusion. Normal cardiac size.

Hepatobiliary: No focal liver abnormality is seen. No gallstones,
gallbladder wall thickening, or biliary dilatation.

Pancreas: Unremarkable. No pancreatic ductal dilatation or
surrounding inflammatory changes.

Spleen: Normal in size without focal abnormality.

Adrenals/Urinary Tract: Adrenal glands are within normal limits.
Minimal left renal collecting system dilatation. Possible 2 mm
distal ureteral stone several cm proximal to the left UVJ.

Stomach/Bowel: Stomach is nonenlarged. No dilated small bowel. Left
colon diverticular disease without acute inflammatory changes.
Appendix not clearly visualized but no right lower quadrant
inflammatory process.

Vascular/Lymphatic: Mild aortic atherosclerosis. No aneurysm. No
suspicious nodes

Reproductive: Calcified uterine fibroids.  No adnexal mass.

Other: Negative for free air or free fluid.

Musculoskeletal: Degenerative changes of the spine. Mild superior
endplate deformity at T12, uncertain age but new since [PL] CT.
IMPRESSION: 1. Minimal left renal collecting system dilatation with possible 2
mm distal ureteral stone several cm proximal to the left UVJ.
2. Mild superior endplate deformity at T12, uncertain age but new as
compared with [PL] CT.

## 2020-08-06 MED ORDER — ONDANSETRON 4 MG PO TBDP: 4.0000 mg | ORAL_TABLET | Freq: Three times a day (TID) | ORAL | 0 refills | Status: AC | PRN

## 2020-08-06 MED ORDER — ONDANSETRON 4 MG PO TBDP
4.0000 mg | ORAL_TABLET | Freq: Once | ORAL | Status: AC
  Administered 2020-08-06: 4 mg via ORAL
  Filled 2020-08-06: qty 1

## 2020-08-06 MED ORDER — OXYCODONE-ACETAMINOPHEN 5-325 MG PO TABS
1.0000 | ORAL_TABLET | Freq: Once | ORAL | Status: AC
  Administered 2020-08-06: 1 via ORAL
  Filled 2020-08-06: qty 1

## 2020-08-06 MED ORDER — OXYCODONE-ACETAMINOPHEN 5-325 MG PO TABS: 1.0000 | ORAL_TABLET | Freq: Four times a day (QID) | ORAL | 0 refills | Status: AC | PRN

## 2020-08-06 MED ORDER — LEVOFLOXACIN 750 MG PO TABS: 750.0000 mg | ORAL_TABLET | Freq: Every day | ORAL | 0 refills | Status: AC

## 2020-08-06 MED ORDER — LEVOFLOXACIN 750 MG PO TABS
750.0000 mg | ORAL_TABLET | Freq: Once | ORAL | Status: AC
  Administered 2020-08-06: 750 mg via ORAL
  Filled 2020-08-06: qty 1

## 2020-08-06 MED ORDER — TAMSULOSIN HCL 0.4 MG PO CAPS: 0.4000 mg | ORAL_CAPSULE | Freq: Every day | ORAL | 0 refills | Status: AC

## 2020-08-06 NOTE — ED Notes (Signed)
Pt ambulatory from room 54 to restroom to provide UA with steady gait, unassisted.

## 2020-08-06 NOTE — Discharge Instructions (Addendum)
Take Levaquin once daily for the next 6 days.

## 2020-08-06 NOTE — ED Notes (Signed)
Patient transported to CT 

## 2020-08-06 NOTE — ED Provider Notes (Signed)
[facility]-EMERGENCY DEPARTMENT  ____________________________________________  Time seen: Approximately 8:42 PM  I have reviewed the triage vital signs and the nursing notes.   HISTORY  Chief Complaint Back Pain   Historian Patient     HPI Olivia Alvarado is a 70 y.o. female presents to the emergency department with bilateral low back pain seems to radiate into the groin bilaterally.  Patient states that she experienced a fall approximately a week ago.  She states that she fell onto a concrete surface.  She reports that since fall, she has experienced low back pain but that groin discomfort started in the past 1 to 2 days.  She was seen and evaluated by orthopedics who started her on prednisone and a muscle relaxer.  She reported that her pain improved some but not significantly.  She states that she has had some increased urinary frequency but denies dysuria or hematuria.  No fever.  She reports that she has had some mild nausea but no vomiting.  She denies experiencing similar pain in the past.  No other alleviating measures have been attempted.   Past Medical History:  Diagnosis Date  . Atrophic vaginitis   . IBS (irritable bowel syndrome)   . Lower back pain   . Nocturia   . OAB (overactive bladder)   . Skin cancer    BCC  . Squamous carcinoma   . Suprapubic pressure   . Urethral stricture   . Urinary frequency   . Urinary incontinence, nocturnal enuresis   . Vaginal atrophy   . Vulvar lesion      Immunizations up to date:  Yes.     Past Medical History:  Diagnosis Date  . Atrophic vaginitis   . IBS (irritable bowel syndrome)   . Lower back pain   . Nocturia   . OAB (overactive bladder)   . Skin cancer    BCC  . Squamous carcinoma   . Suprapubic pressure   . Urethral stricture   . Urinary frequency   . Urinary incontinence, nocturnal enuresis   . Vaginal atrophy   . Vulvar lesion     Patient Active Problem List   Diagnosis Date Noted  .  Microscopic hematuria 08/02/2015  . Adnexal pain 08/02/2015  . Mild episode of recurrent major depressive disorder ([place]) 03/12/2015  . Controlled type 2 diabetes mellitus without complication ([place]) 16/01/9603  . BP (high blood pressure) 12/04/2013  . 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 SKIN CANCER EXCISION      Prior to Admission medications   Medication Sig Start Date End Date Taking? Authorizing Provider  levofloxacin (LEVAQUIN) 750 MG tablet Take 1 tablet (750 mg total) by mouth daily for 6 days. 08/06/20 08/12/20 Yes Vallarie Mare M, PA-C  ondansetron (ZOFRAN ODT) 4 MG disintegrating tablet Take 1 tablet (4 mg total) by mouth every 8 (eight) hours as needed for up to 3 days. 08/06/20 08/09/20 Yes Vallarie Mare M, PA-C  oxyCODONE-acetaminophen (PERCOCET/ROXICET) 5-325 MG tablet Take 1 tablet by mouth every 6 (six) hours as needed for up to 3 days. 08/06/20 08/09/20 Yes Vallarie Mare M, PA-C  tamsulosin (FLOMAX) 0.4 MG CAPS capsule Take 1 capsule (0.4 mg total) by mouth daily for 7 days. 08/06/20 08/13/20 Yes Vallarie Mare M, PA-C  FLUoxetine (PROZAC) 10 MG capsule Take 10 mg by mouth daily.    [provider]    Allergies Patient has no known allergies.  Family History  Problem Relation  Age of Onset  . Breast cancer Maternal Grandmother 50  . Colon cancer Maternal Grandmother 6470  . Colon cancer Paternal Grandmother 4060  . Kidney cancer Mother 2570  . Rectal cancer Mother 4470  . Ovarian cancer Mother 6338  . Colon cancer Maternal Uncle 80  . Colon cancer Other 5880       Paternal Great Aunt  . Colon cancer Other 4180       Paternal Great Aunt  . Arthritis Other        mother, Father  . Diabetes Other   . Hypertension Other   . Heart disease Other        Mother, Father  . Osteoporosis Other     Social History Social History   Tobacco Use  . Smoking status: Current Every Day Smoker    Packs/day: 0.50    Years: 40.00    Pack years:  20.00    Types: Cigarettes, E-cigarettes  . Smokeless tobacco: Never Used  Vaping Use  . Vaping Use: Every day  Substance Use Topics  . Alcohol use: No  . Drug use: No     Review of Systems  Constitutional: No fever/chills Eyes:  No discharge ENT: No upper respiratory complaints. Respiratory: no cough. No SOB/ use of accessory muscles to breath Gastrointestinal:   No nausea, no vomiting.  No diarrhea.  No constipation. {**Musculoskeletal: Patient has low back pain.  Skin: Negative for rash, abrasions, lacerations, ecchymosis.   ____________________________________________   PHYSICAL EXAM:  VITAL SIGNS: ED Triage Vitals  Enc Vitals Group     BP 08/06/20 1855 (!) 175/62     Pulse Rate 08/06/20 1855 88     Resp 08/06/20 1855 18     Temp 08/06/20 1855 98.3 F (36.8 C)     Temp Source 08/06/20 1855 Oral     SpO2 08/06/20 1855 95 %     Weight 08/06/20 1900 135 lb (61.2 kg)     Height 08/06/20 1900 5\' 2"  (1.575 m)     Head Circumference --      Peak Flow --      Pain Score 08/06/20 1856 3     Pain Loc --      Pain Edu? --      Excl. in GC? --      Constitutional: Alert and oriented. Well appearing and in no acute distress. Eyes: Conjunctivae are normal. PERRL. EOMI. Head: Atraumatic. ENT:      Nose: No congestion/rhinnorhea.      Mouth/Throat: Mucous membranes are moist.  Neck: No stridor.  No cervical spine tenderness to palpation. Cardiovascular: Normal rate, regular rhythm. Normal S1 and S2.  Good peripheral circulation. Respiratory: Normal respiratory effort without tachypnea or retractions. Lungs CTAB. Good air entry to the bases with no decreased or absent breath sounds Gastrointestinal: Bowel sounds x 4 quadrants. Soft and nontender to palpation. No guarding or rigidity. No distention. Musculoskeletal: Full range of motion to all extremities. No obvious deformities noted.  No midline thoracic or lumbar spine tenderness. Neurologic:  Normal for age. No gross  focal neurologic deficits are appreciated.  Skin:  Skin is warm, dry and intact. No rash noted. Psychiatric: Mood and affect are normal for age. Speech and behavior are normal.   ____________________________________________   LABS (all labs ordered are listed, but only abnormal results are displayed)  Labs Reviewed  URINALYSIS, COMPLETE (UACMP) WITH MICROSCOPIC - Abnormal; Notable for the following components:      Result Value  Color, Urine YELLOW (*)    APPearance HAZY (*)    Hgb urine dipstick MODERATE (*)    Leukocytes,Ua LARGE (*)    All other components within normal limits  COMPREHENSIVE METABOLIC PANEL - Abnormal; Notable for the following components:   Glucose, Bld 110 (*)    All other components within normal limits  CBC WITH DIFFERENTIAL/PLATELET   ____________________________________________  EKG   ____________________________________________  RADIOLOGY Unk Pinto, personally viewed and evaluated these images (plain radiographs) as part of my medical decision making, as well as reviewing the written report by the radiologist.    CT Renal Stone Study  Result Date: 08/06/2020 CLINICAL DATA:  Back pain for 1 month EXAM: CT ABDOMEN AND PELVIS WITHOUT CONTRAST TECHNIQUE: Multidetector CT imaging of the abdomen and pelvis was performed following the standard protocol without IV contrast. COMPARISON:  CT 08/03/2015 FINDINGS: Lower chest: Lung bases demonstrate no acute consolidation or effusion. Normal cardiac size. Hepatobiliary: No focal liver abnormality is seen. No gallstones, gallbladder wall thickening, or biliary dilatation. Pancreas: Unremarkable. No pancreatic ductal dilatation or surrounding inflammatory changes. Spleen: Normal in size without focal abnormality. Adrenals/Urinary Tract: Adrenal glands are within normal limits. Minimal left renal collecting system dilatation. Possible 2 mm distal ureteral stone several cm proximal to the left UVJ.  Stomach/Bowel: Stomach is nonenlarged. No dilated small bowel. Left colon diverticular disease without acute inflammatory changes. Appendix not clearly visualized but no right lower quadrant inflammatory process. Vascular/Lymphatic: Mild aortic atherosclerosis. No aneurysm. No suspicious nodes Reproductive: Calcified uterine fibroids.  No adnexal mass. Other: Negative for free air or free fluid. Musculoskeletal: Degenerative changes of the spine. Mild superior endplate deformity at U72, uncertain age but new since 2017 CT. IMPRESSION: 1. Minimal left renal collecting system dilatation with possible 2 mm distal ureteral stone several cm proximal to the left UVJ. 2. Mild superior endplate deformity at Z36, uncertain age but new as compared with 2017 CT. Electronically Signed   By: Donavan Foil M.D.   On: 08/06/2020 22:24    ____________________________________________    PROCEDURES  Procedure(s) performed:     Procedures     Medications  levofloxacin (LEVAQUIN) tablet 750 mg (750 mg Oral Given 08/06/20 2259)  oxyCODONE-acetaminophen (PERCOCET/ROXICET) 5-325 MG per tablet 1 tablet (1 tablet Oral Given 08/06/20 2305)  ondansetron (ZOFRAN-ODT) disintegrating tablet 4 mg (4 mg Oral Given 08/06/20 2305)     ____________________________________________   INITIAL IMPRESSION / ASSESSMENT AND PLAN / ED COURSE  Pertinent labs & imaging results that were available during my care of the patient were reviewed by me and considered in my medical decision making (see chart for details).      Assessment and plan Flank pain 70 year old female presents to the emergency department with bilateral flank pain and nausea.  Patient was hypertensive at triage but vital signs were otherwise reassuring.  Patient had no CVA tenderness on exam or abdominal pain to palpation.  CBC and CMP were reassuring.  Urinalysis indicated a large amount of leukocytes and a moderate amount of blood.  CT renal stone study was  concerning for a 2 mm left-sided renal stone.  I reached out to urologist on-call, Dr. Diamantina Providence given concern for signs of infection on urinalysis.  He recommended Flomax and pain control.  He felt patient was appropriate for outpatient follow-up as needed.  I started patient on Levaquin once daily for the next 5 days as I am concerned about early pyelonephritis as patient has bilateral flank pain and nausea  and has a history of pyelonephritis.  I gave patient strict return precautions to return to the emergency department with worsening fever, chills, low back pain, abdominal pain or vomiting.  He was also discharged with a short course of Percocet for pain.  All patient questions were answered.   ____________________________________________  FINAL CLINICAL IMPRESSION(S) / ED DIAGNOSES  Final diagnoses:  Flank pain      NEW MEDICATIONS STARTED DURING THIS VISIT:  ED Discharge Orders         Ordered    levofloxacin (LEVAQUIN) 750 MG tablet  Daily        08/06/20 2246    tamsulosin (FLOMAX) 0.4 MG CAPS capsule  Daily        08/06/20 2247    oxyCODONE-acetaminophen (PERCOCET/ROXICET) 5-325 MG tablet  Every 6 hours PRN        08/06/20 2252    ondansetron (ZOFRAN ODT) 4 MG disintegrating tablet  Every 8 hours PRN        08/06/20 2252              This chart was dictated using voice recognition software/Dragon. Despite best efforts to proofread, errors can occur which can change the meaning. Any change was purely unintentional.     Lannie Fields, PA-C 08/07/20 0013    Harvest Dark, MD 08/10/20 [phone]

## 2020-08-06 NOTE — ED Notes (Signed)
Pt returned to ED from CT at this time.

## 2020-08-06 NOTE — ED Triage Notes (Signed)
Pt with c/o back pain x one month, with bone spurs and arthritis. Pt states pain is radiating from back forward into groin since yesterday. Pt denies urinary symptoms. Pt states some nausea.

## 2020-08-08 LAB — URINE CULTURE: Culture: NO GROWTH

## 2020-08-20 ENCOUNTER — Emergency Department: Payer: Medicare Other

## 2020-08-20 ENCOUNTER — Emergency Department
Admission: EM | Admit: 2020-08-20 | Discharge: 2020-08-20 | Disposition: A | Discharge status: Home / Self Care | Payer: Medicare Other | Attending: Emergency Medicine | Admitting: Emergency Medicine

## 2020-08-20 ENCOUNTER — Other Ambulatory Visit: Payer: Self-pay

## 2020-08-20 DIAGNOSIS — S22089A Unspecified fracture of T11-T12 vertebra, initial encounter for closed fracture: Secondary | ICD-10-CM | POA: Insufficient documentation

## 2020-08-20 DIAGNOSIS — F17210 Nicotine dependence, cigarettes, uncomplicated: Secondary | ICD-10-CM | POA: Diagnosis not present

## 2020-08-20 DIAGNOSIS — S22059A Unspecified fracture of T5-T6 vertebra, initial encounter for closed fracture: Secondary | ICD-10-CM | POA: Insufficient documentation

## 2020-08-20 DIAGNOSIS — E119 Type 2 diabetes mellitus without complications: Secondary | ICD-10-CM | POA: Diagnosis not present

## 2020-08-20 DIAGNOSIS — S22050A Wedge compression fracture of T5-T6 vertebra, initial encounter for closed fracture: Secondary | ICD-10-CM

## 2020-08-20 DIAGNOSIS — Z85828 Personal history of other malignant neoplasm of skin: Secondary | ICD-10-CM | POA: Insufficient documentation

## 2020-08-20 DIAGNOSIS — S22080A Wedge compression fracture of T11-T12 vertebra, initial encounter for closed fracture: Secondary | ICD-10-CM

## 2020-08-20 DIAGNOSIS — S3992XA Unspecified injury of lower back, initial encounter: Secondary | ICD-10-CM | POA: Diagnosis present

## 2020-08-20 DIAGNOSIS — I10 Essential (primary) hypertension: Secondary | ICD-10-CM | POA: Diagnosis not present

## 2020-08-20 DIAGNOSIS — S22039A Unspecified fracture of third thoracic vertebra, initial encounter for closed fracture: Secondary | ICD-10-CM | POA: Diagnosis not present

## 2020-08-20 DIAGNOSIS — X501XXA Overexertion from prolonged static or awkward postures, initial encounter: Secondary | ICD-10-CM | POA: Insufficient documentation

## 2020-08-20 DIAGNOSIS — R079 Chest pain, unspecified: Secondary | ICD-10-CM | POA: Diagnosis not present

## 2020-08-20 DIAGNOSIS — S22030A Wedge compression fracture of third thoracic vertebra, initial encounter for closed fracture: Secondary | ICD-10-CM

## 2020-08-20 LAB — CBC
HCT: 40.2 % (ref 36.0–46.0)
Hemoglobin: 13.6 g/dL (ref 12.0–15.0)
MCH: 29.4 pg (ref 26.0–34.0)
MCHC: 33.8 g/dL (ref 30.0–36.0)
MCV: 87 fL (ref 80.0–100.0)
Platelets: 345 10*3/uL (ref 150–400)
RBC: 4.62 MIL/uL (ref 3.87–5.11)
RDW: 12.5 % (ref 11.5–15.5)
WBC: 6.9 10*3/uL (ref 4.0–10.5)
nRBC: 0 % (ref 0.0–0.2)

## 2020-08-20 LAB — BASIC METABOLIC PANEL
Anion gap: 9 (ref 5–15)
BUN: 18 mg/dL (ref 8–23)
CO2: 24 mmol/L (ref 22–32)
Calcium: 9.4 mg/dL (ref 8.9–10.3)
Chloride: 105 mmol/L (ref 98–111)
Creatinine, Ser: 0.39 mg/dL — ABNORMAL LOW (ref 0.44–1.00)
GFR, Estimated: >60 mL/min (ref >60)
Glucose, Bld: 138 mg/dL — ABNORMAL HIGH (ref 70–99)
Potassium: 4.2 mmol/L (ref 3.5–5.1)
Sodium: 138 mmol/L (ref 135–145)

## 2020-08-20 LAB — TROPONIN I (HIGH SENSITIVITY)
Troponin I (High Sensitivity): 5 ng/L (ref <18)
Troponin I (High Sensitivity): 6 ng/L (ref <18)

## 2020-08-20 IMAGING — CT CT L SPINE W/O CM
3 of 4 series · 12 of 33 positions shown, 14 images · non-contrast
Comparison: CT [DATE]

CLINICAL DATA: Fell 1 month ago with back pain

EXAM:
CT LUMBAR SPINE WITHOUT CONTRAST
TECHNIQUE: Multidetector CT imaging of the lumbar spine was performed without
intravenous contrast administration. Multiplanar CT image
reconstructions were also generated.

[Series 8: sagittal st · sagittal · 0.29mm/px · 5 of 72 slices shown, 6 images]
[im 24/72  bone]
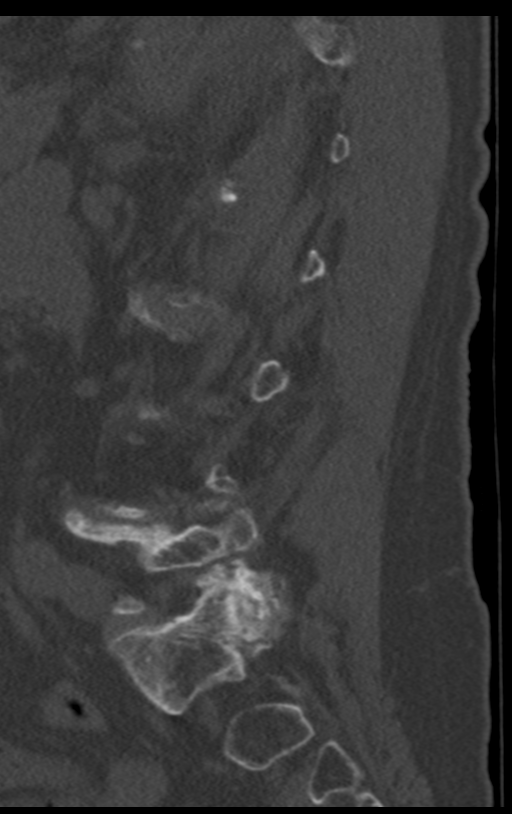
[im 30/72  bone]
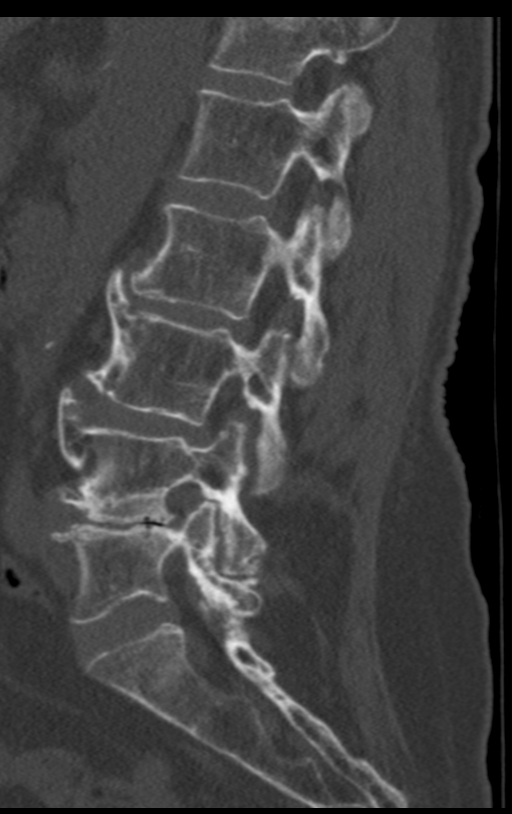
[im 36/72  soft-tissue]
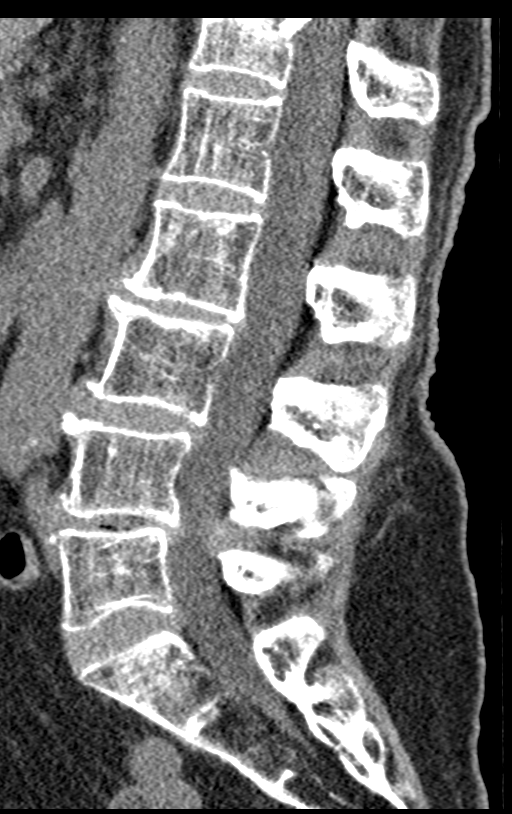
[im 36/72  bone]
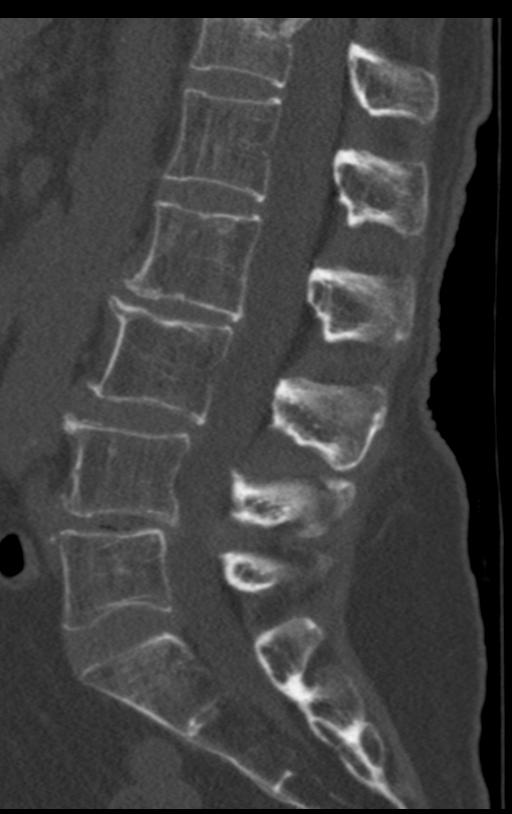
[im 42/72  bone]
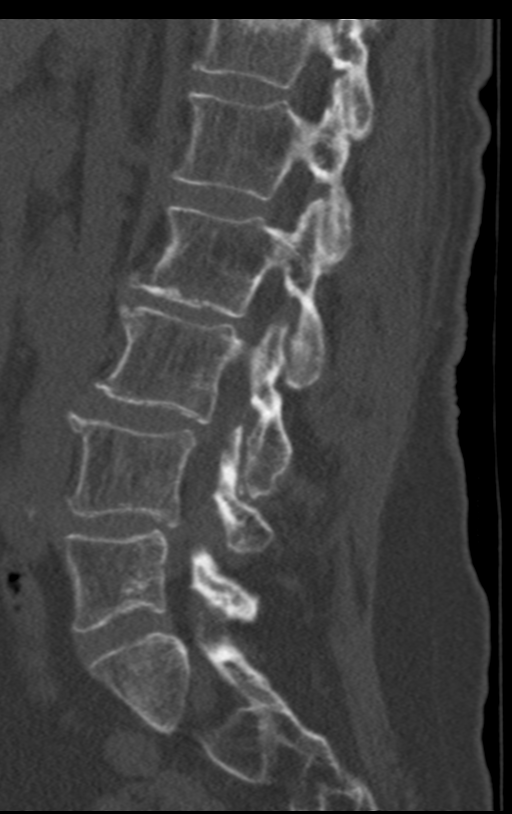
[im 48/72  bone]
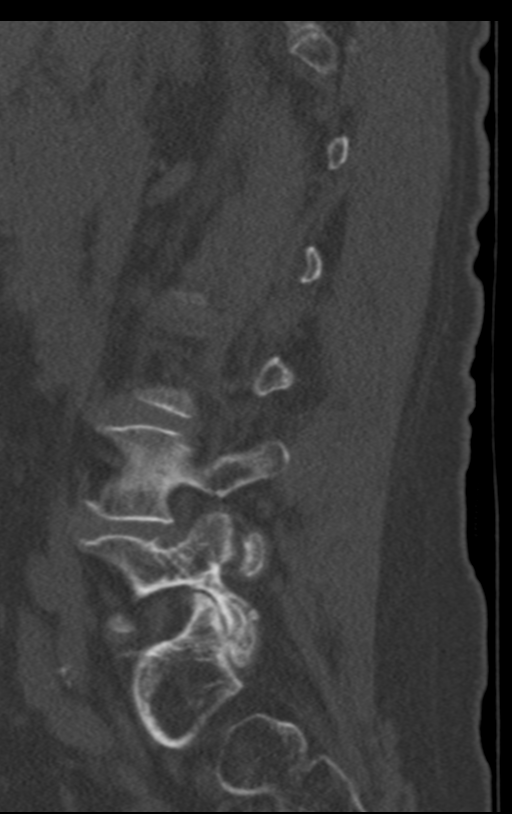

[Series 9: coronal bone · coronal · 0.28mm/px · 3 of 56 slices shown]
[im 12/56  bone]
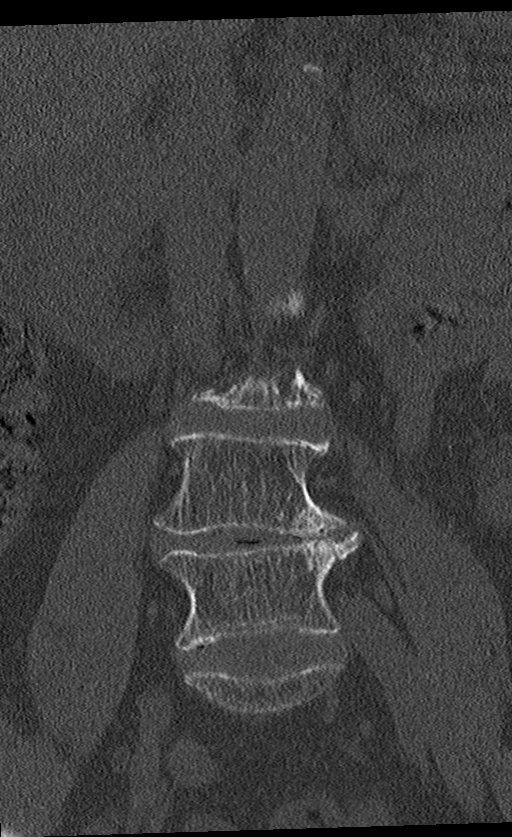
[im 23/56  bone]
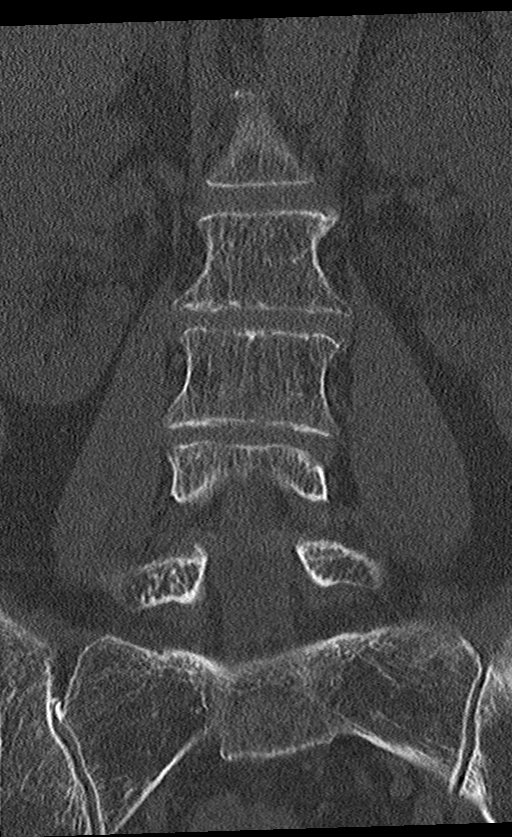
[im 34/56  bone]
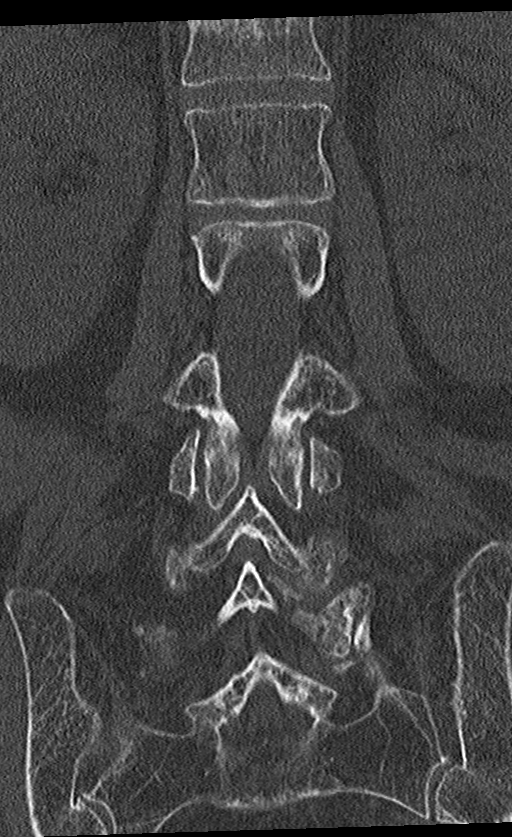

[Series 10: multi disc · axial · 0.21mm/px · z∈[-538,-391]mm · 4 of 88 slices shown, 5 images]
[im 18/88  soft-tissue]
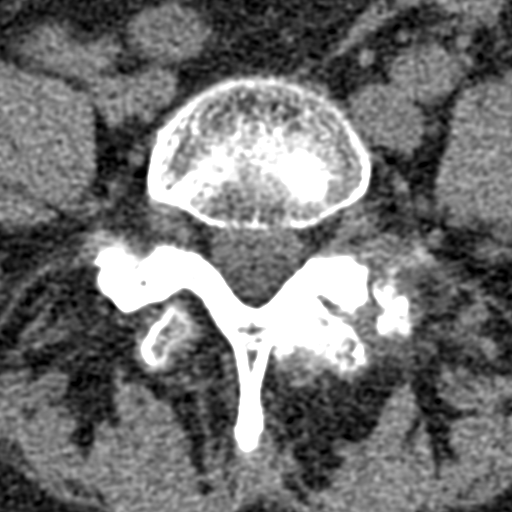
[im 18/88  bone]
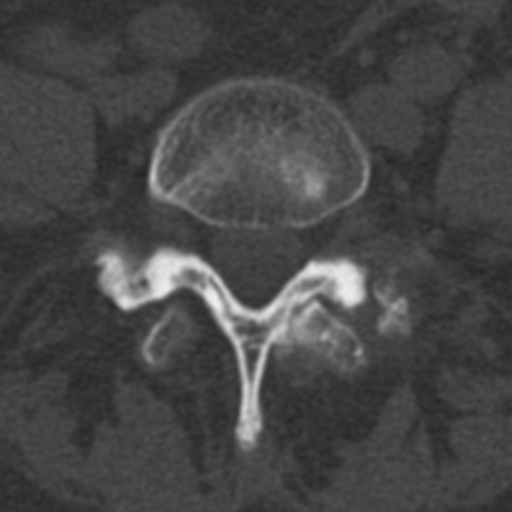
[im 35/88  bone]
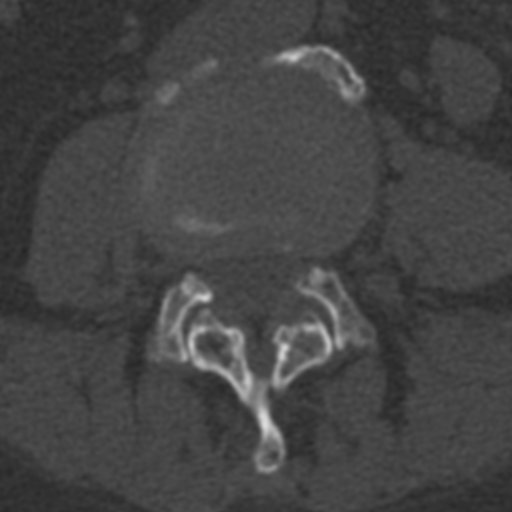
[im 53/88  bone]
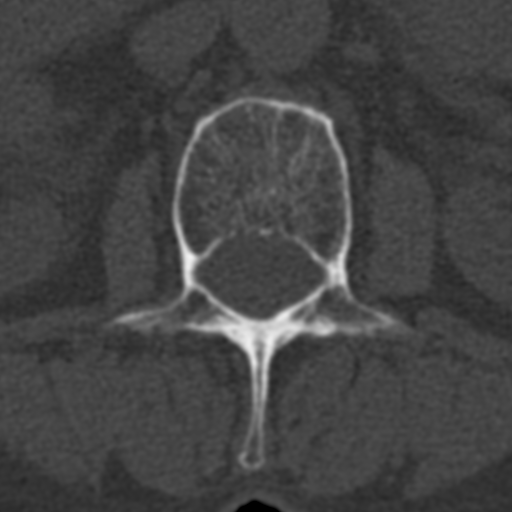
[im 70/88  bone]
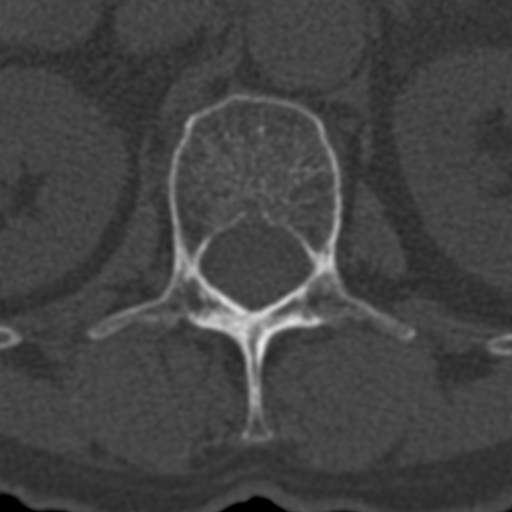

[12 of 33 positions shown; findings below may reference images not displayed]

FINDINGS: Segmentation: 5 lumbar type vertebrae.

Alignment: 3 mm retrolisthesis L3 on L4 and L4 on L5.

Vertebrae: Vertebral body heights are maintained. No fracture is
seen.

Paraspinal and other soft tissues: Negative.

Disc levels:

At T12-L1, maintained disc space.  No canal stenosis.

At L1-L2, maintained disc space. No canal stenosis. Foramen are
patent bilaterally.

At L2-L3, moderate disc space narrowing with osteophyte. No
significant canal stenosis. Mild facet degenerative changes. No
significant foraminal narrowing.

At L3-L4, maintained disc space. No canal stenosis. Mild ligamentum
flavum thickening and bilateral facet degenerative changes. The
foramen are patent bilaterally.

At L5-S1, mild disc space narrowing and degenerative osteophyte.
Mild broad-based disc bulge. Ligamentum flavum thickening and
hypertrophic facet degenerative change, results in mild canal
stenosis. Mild bilateral inferior foraminal narrowing.

At L5-S1, maintained disc space. No significant canal stenosis.
Hypertrophic facet degenerative changes. The foramen are patent
bilaterally.
IMPRESSION: 1. No acute osseous abnormality.
2. Multilevel degenerative changes. Trace retrolisthesis L3 on L4
and L4 on L5 likely due to degenerative disease.

## 2020-08-20 IMAGING — CT CT T SPINE W/O CM
3 of 4 series · 9 of 33 positions shown, 11 images · non-contrast
Comparison: CT [DATE]

CLINICAL DATA: Back pain fall 1 month ago

EXAM:
CT THORACIC SPINE WITHOUT CONTRAST
TECHNIQUE: Multidetector CT images of the thoracic were obtained using the
standard protocol without intravenous contrast.

[Series 5: multidisc · axial · 0.21mm/px · z∈[-243,-243]mm · 1 of 146 slices shown, 2 images]
[im 73/146  soft-tissue]
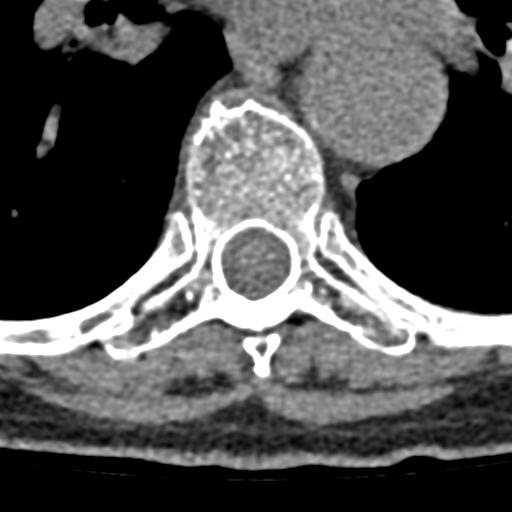
[im 73/146  bone]
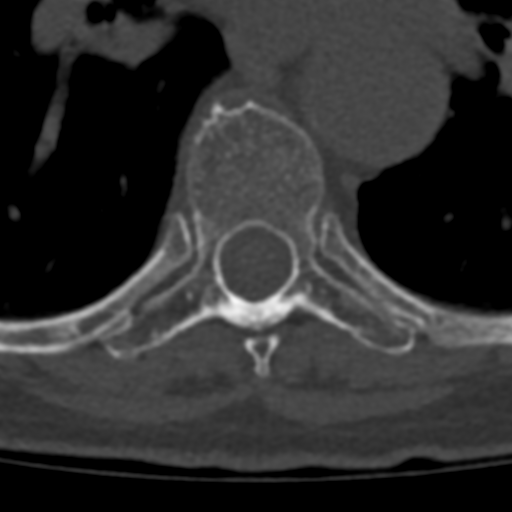

[Series 6: sagittal bone · sagittal · 0.29mm/px · 5 of 45 slices shown, 6 images]
[im 15/45  bone]
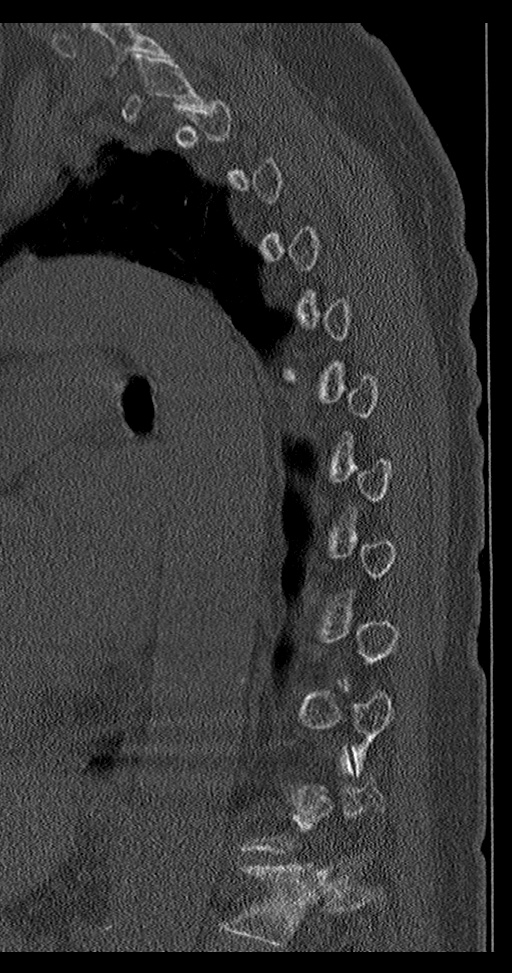
[im 19/45  bone]
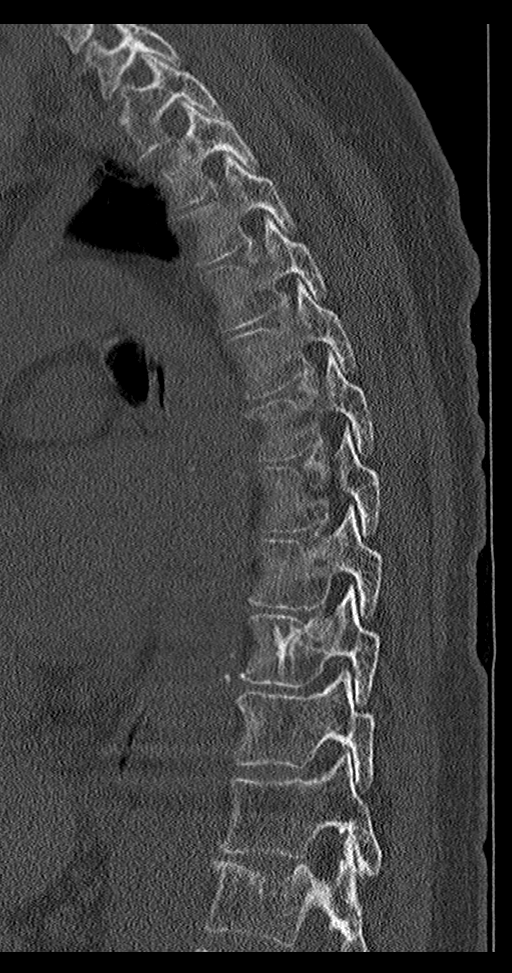
[im 23/45  soft-tissue]
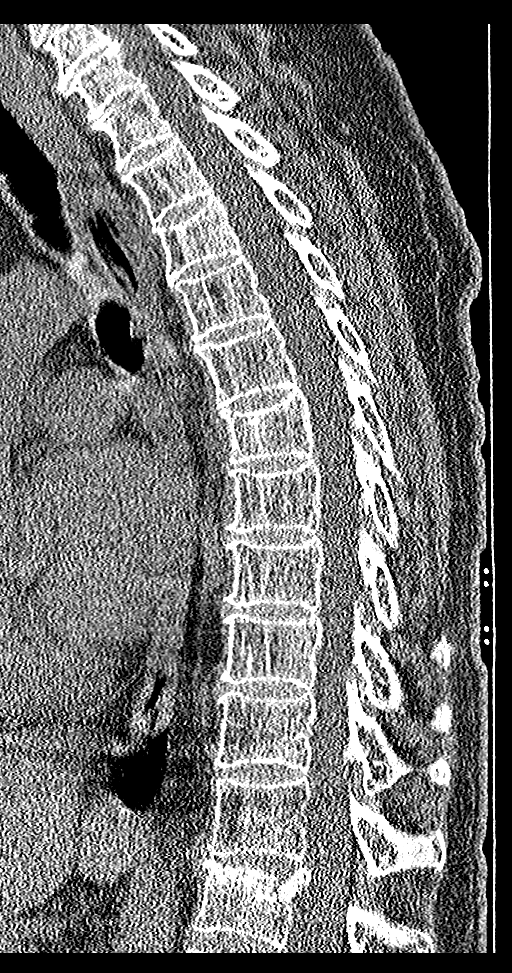
[im 23/45  bone]
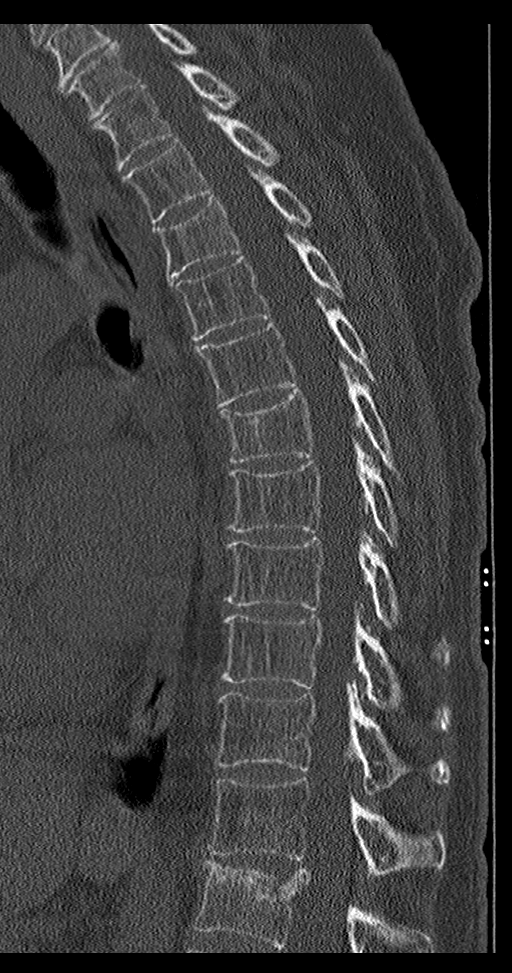
[im 26/45  bone]
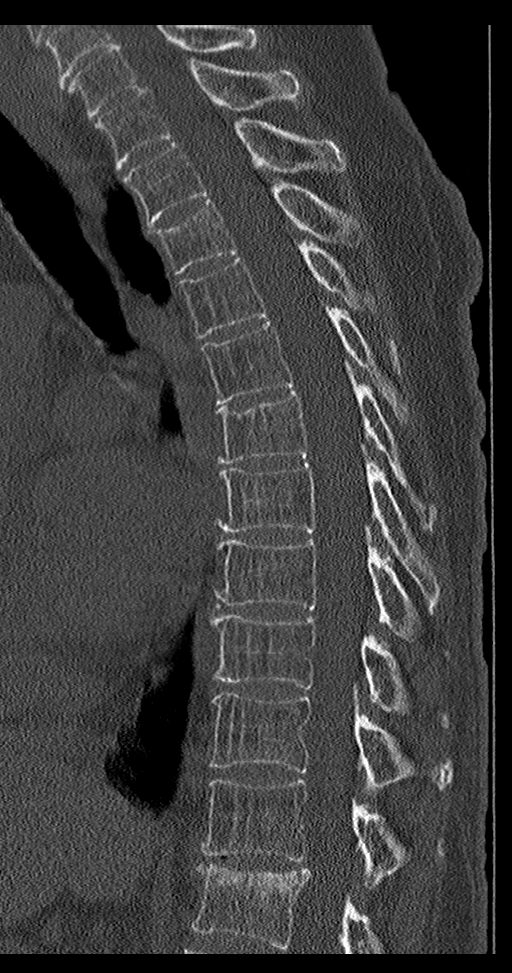
[im 30/45  bone]
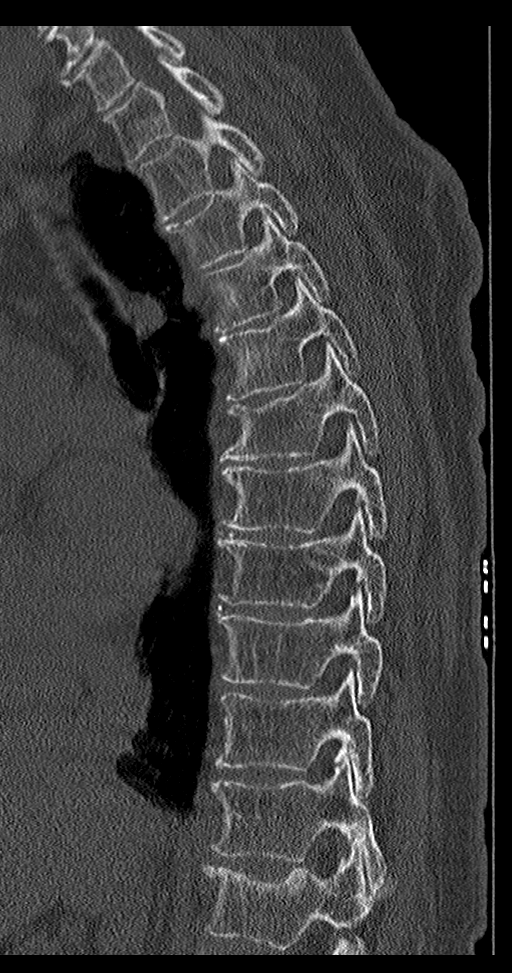

[Series 7: coronal bone · coronal · 0.26mm/px · 3 of 67 slices shown]
[im 14/67  bone]
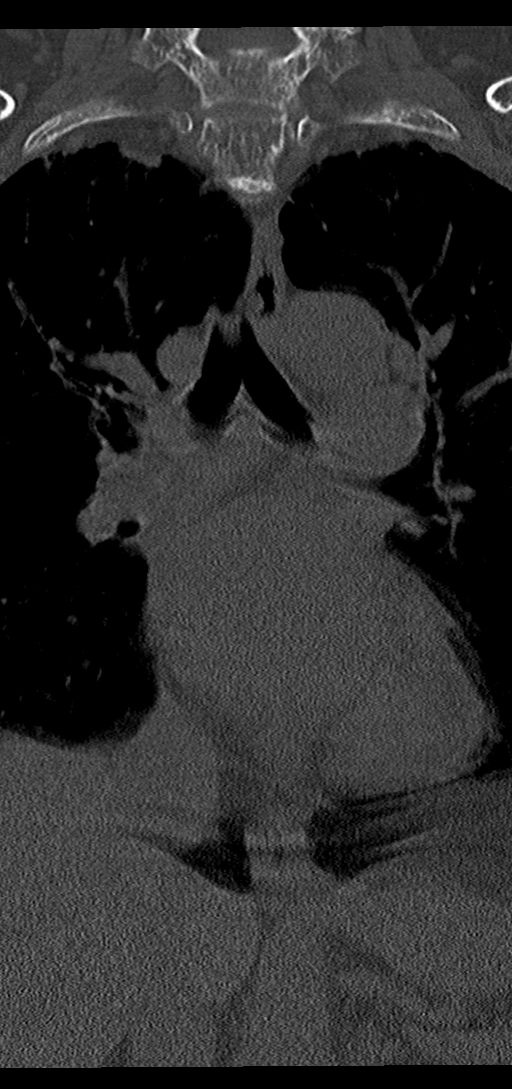
[im 27/67  bone]
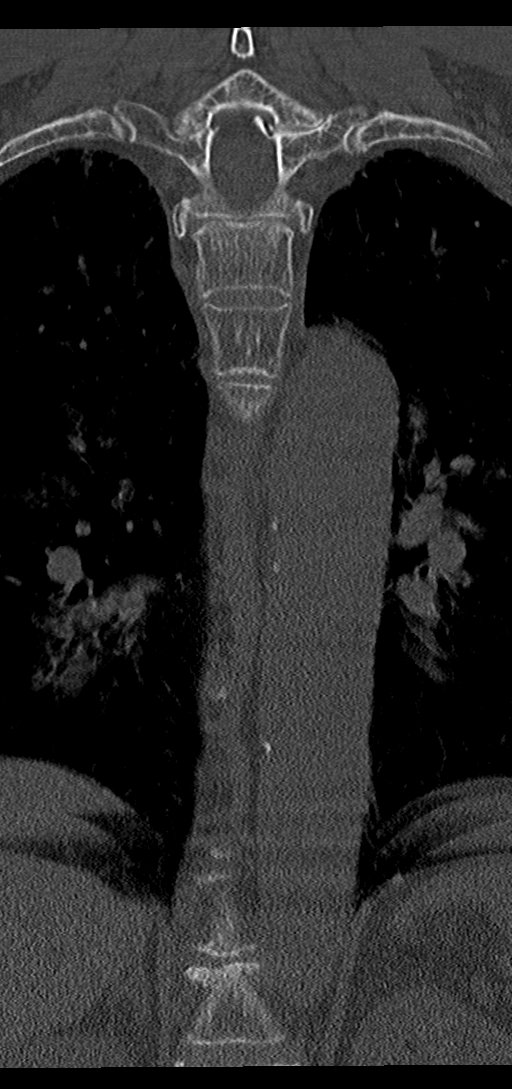
[im 40/67  bone]
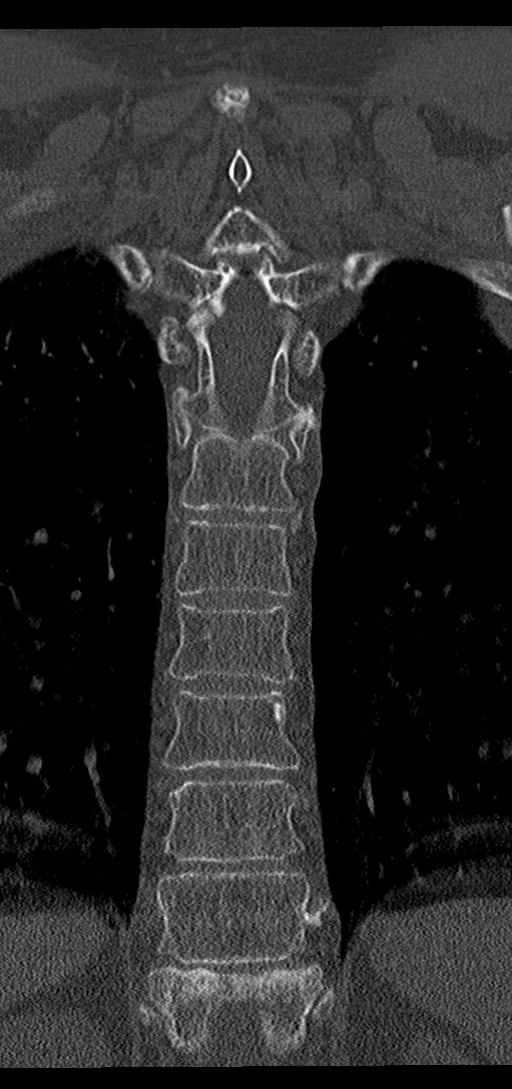

[9 of 33 positions shown; findings below may reference images not displayed]

FINDINGS: Alignment: Normal.

Vertebrae: Mild age indeterminate superior endplate deformities at
T3 and T6. Mild superior endplate deformity at T12 with slight
sclerosis. Less than 10% loss of vertebral body height at this
level.

Paraspinal and other soft tissues: Slightly indistinct paravertebral
soft tissues at T12. Paravertebral and paraspinal soft tissues
otherwise unremarkable

Disc levels: Mild degenerative osteophytes at multiple levels.
IMPRESSION: 1. Mild superior endplate deformity at T12 with similar minimal loss
of height as compared with [DATE] CT. Mild paravertebral edema
suggests subacute process.
2. Age indeterminate mild superior endplate deformities at T3 and
T6.

## 2020-08-20 IMAGING — CR DG CHEST 2V
1 series · 2 of 2 positions shown · non-contrast
Comparison: [DATE].

CLINICAL DATA: Chest pain.

EXAM:
CHEST - 2 VIEW

[Series 1: dg chest 2 view · 0.14mm/px · 2 of 2 slices shown]
[im 1/2]
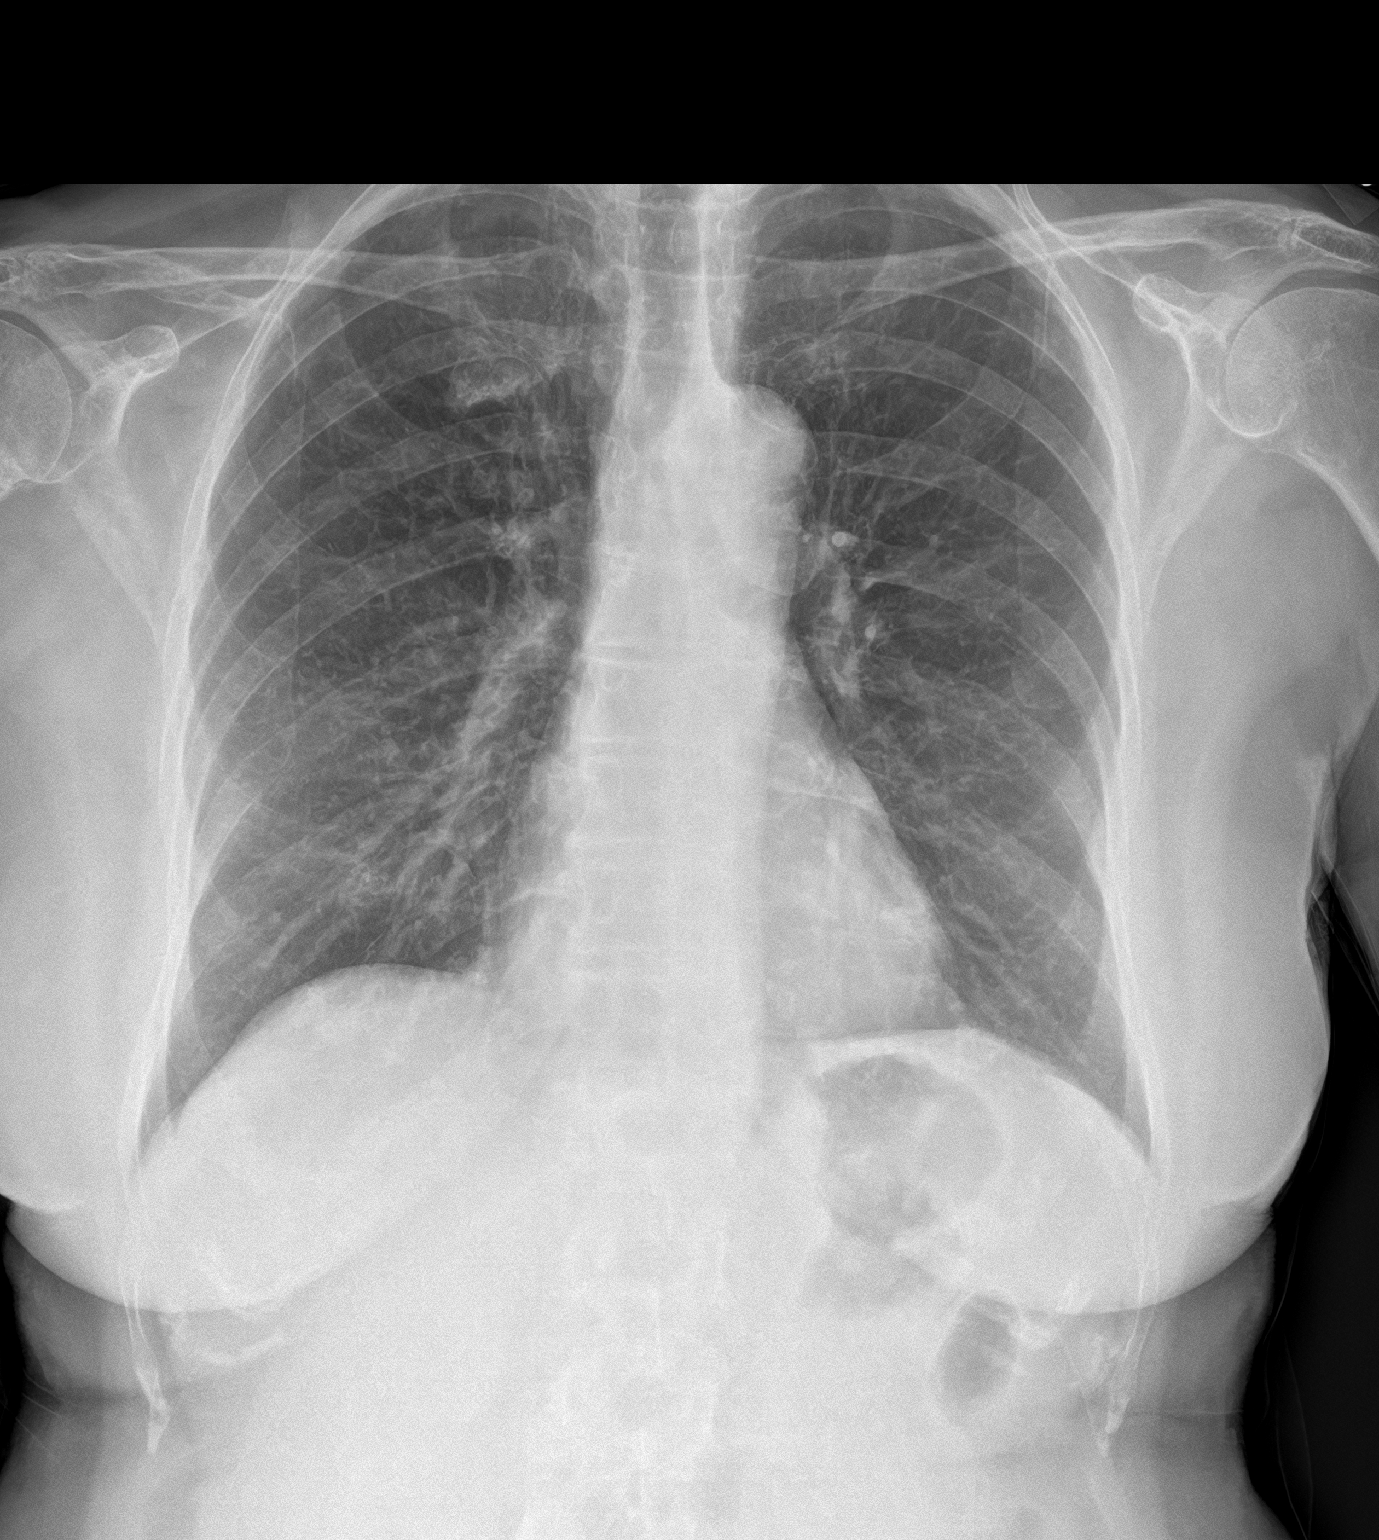
[im 2/2]
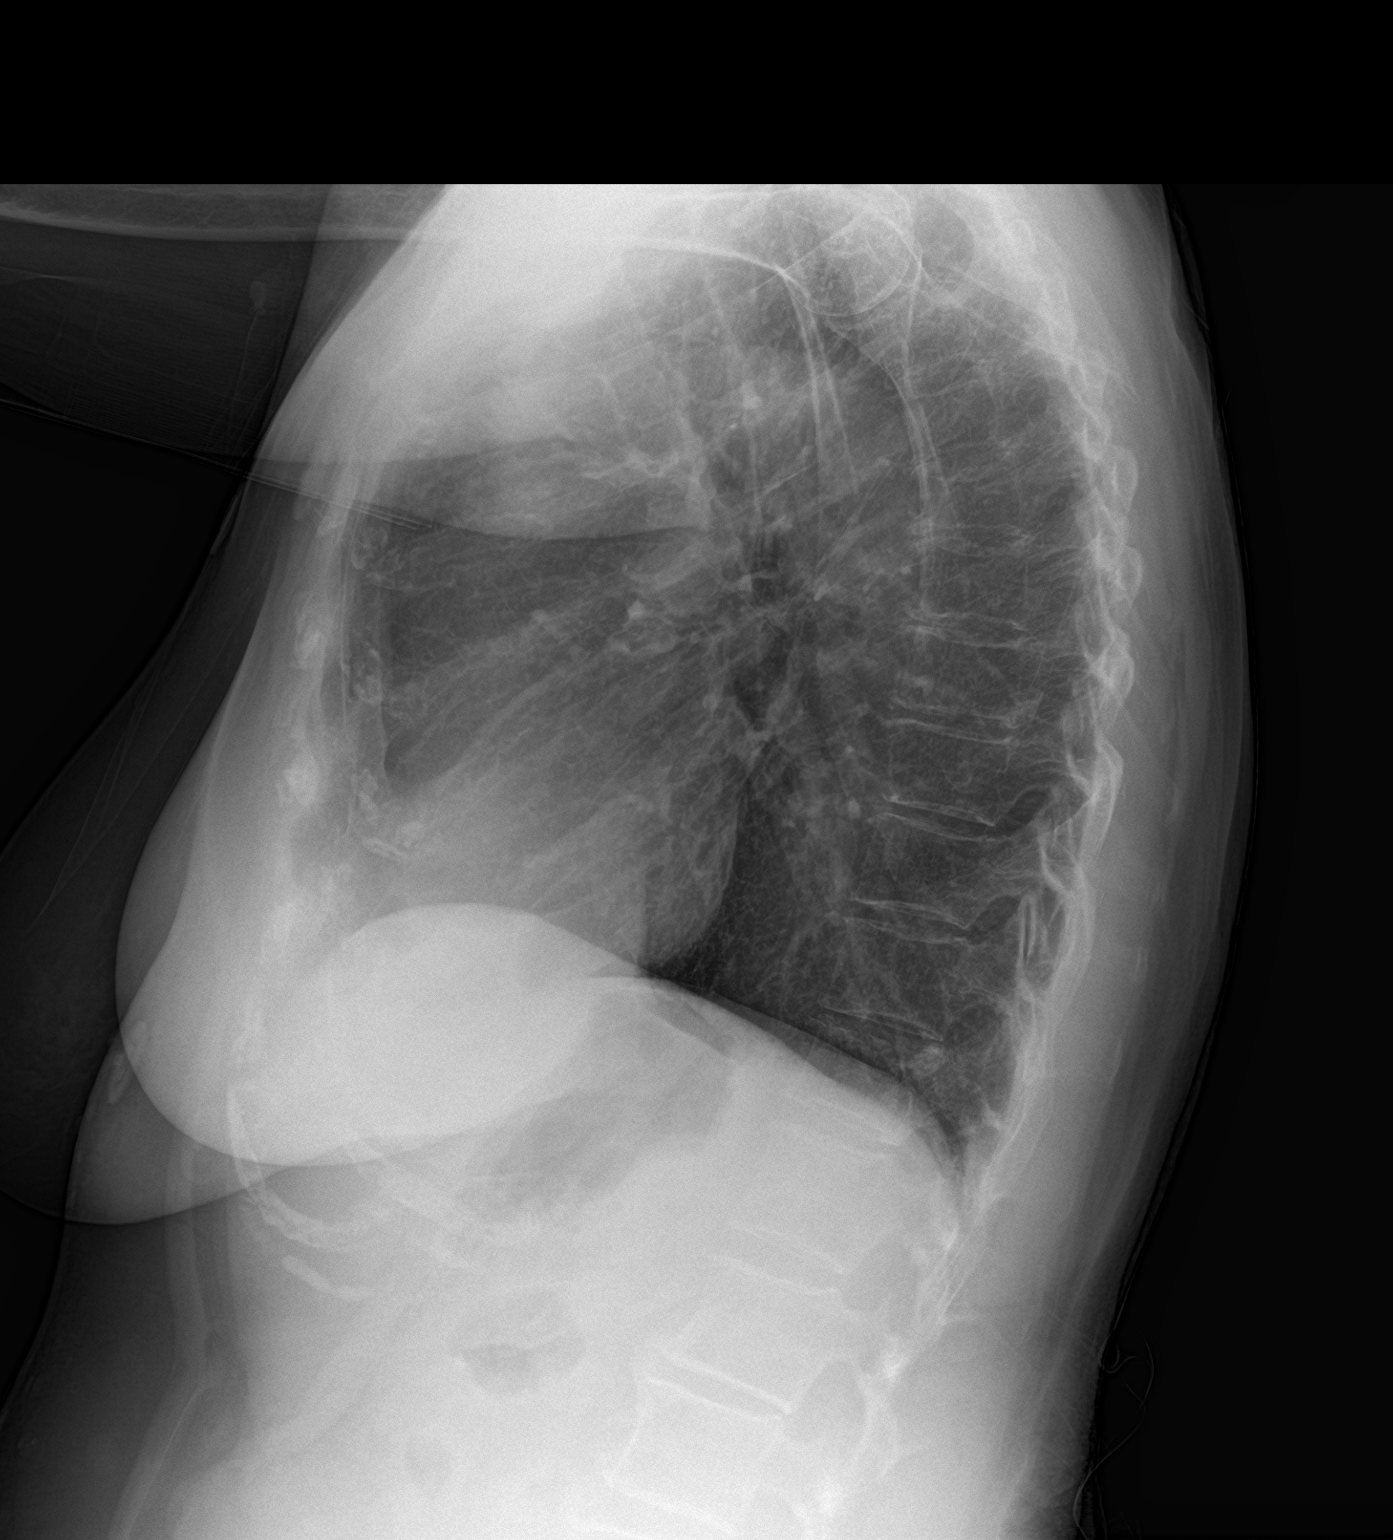

[2 of 2 positions shown; findings below may reference images not displayed]

FINDINGS: The heart size and mediastinal contours are within normal limits.
Both lungs are clear. The visualized skeletal structures are
unremarkable.
IMPRESSION: No active cardiopulmonary disease.

## 2020-08-20 MED ORDER — ACETAMINOPHEN 325 MG PO TABS
650.0000 mg | ORAL_TABLET | Freq: Once | ORAL | Status: AC
  Administered 2020-08-20: 650 mg via ORAL
  Filled 2020-08-20: qty 2

## 2020-08-20 MED ORDER — OXYCODONE HCL 5 MG PO TABS: 2.5000 mg | ORAL_TABLET | Freq: Four times a day (QID) | ORAL | 0 refills | Status: DC | PRN

## 2020-08-20 MED ORDER — OXYCODONE-ACETAMINOPHEN 5-325 MG PO TABS
1.0000 | ORAL_TABLET | Freq: Once | ORAL | Status: AC
  Administered 2020-08-20: 1 via ORAL
  Filled 2020-08-20: qty 1

## 2020-08-20 MED ORDER — IBUPROFEN 400 MG PO TABS: 400.0000 mg | ORAL_TABLET | Freq: Three times a day (TID) | ORAL | 0 refills | Status: AC | PRN

## 2020-08-20 MED ORDER — KETOROLAC TROMETHAMINE 30 MG/ML IJ SOLN
15.0000 mg | Freq: Once | INTRAMUSCULAR | Status: AC
  Administered 2020-08-20: 15 mg via INTRAVENOUS
  Filled 2020-08-20: qty 1

## 2020-08-20 NOTE — Discharge Instructions (Addendum)
For pain: Take Tylenol 1000 mg every 6 hours for the next 3-5 days, then as needed Take the Ibuprofen every 8 hours for 2-3 days, then as needed Take the Oxycodone, with food, for severe pain  Wear the brace at all times when upright. It is OK to take off when lying flat or in shower, briefly.  Follow-up with neurosurgery

## 2020-08-20 NOTE — ED Notes (Signed)
Patient transported to CT 

## 2020-08-20 NOTE — Progress Notes (Signed)
Orthopedic Tech Progress Note Patient Details:  [name] [birth year] [id number] Called in brace Patient ID: Olivia Alvarado, female   DOB: 04-13-1950, 70 y.o.   MRN: [id number]   Ellouise Newer 08/20/2020, 6:33 PM

## 2020-08-20 NOTE — ED Notes (Addendum)
Pt denied any questions on brace prior to discharge. Ambulatory around room without difficulty

## 2020-08-20 NOTE — ED Notes (Addendum)
Secretary called for brace, states it will be 2-3 hours. Pt made aware. Per Ellender Hose, MD, pt can be discharged after brace arrives.

## 2020-08-20 NOTE — ED Notes (Signed)
Brace arrived, being placed at this time

## 2020-08-20 NOTE — ED Provider Notes (Signed)
[facility] Emergency Department Provider Note  ____________________________________________   Event Date/Time   First MD Initiated Contact with Patient 08/20/20 1551     (approximate)  I have reviewed the triage vital signs and the nursing notes.   HISTORY  Chief Complaint Chest Pain    HPI Olivia Alvarado is a 70 y.o. female here with upper back/chest pain.  The patient states that over the last week or so, she has had progressively worsening sharp, positional, upper midline thoracic back pain.  This pain radiates towards her chest with certain movements.  She feels a catching sensation.  She states that the pain is worse when she rolls in bed.  Denies any persistent chest pain.  No shortness of breath, though when the pain is severe she does feel like she cannot catch her breath.  She denies any recent trauma although she did fall several weeks ago and was noted to have a possible back injury at that time.  Denies history of thoracic spine issues.  No other acute complaints.  No lower extremity weakness or numbness.        Past Medical History:  Diagnosis Date  . Atrophic vaginitis   . IBS (irritable bowel syndrome)   . Lower back pain   . Nocturia   . OAB (overactive bladder)   . Skin cancer    BCC  . Squamous carcinoma   . Suprapubic pressure   . Urethral stricture   . Urinary frequency   . Urinary incontinence, nocturnal enuresis   . Vaginal atrophy   . Vulvar lesion     Patient Active Problem List   Diagnosis Date Noted  . Microscopic hematuria 08/02/2015  . Adnexal pain 08/02/2015  . Mild episode of recurrent major depressive disorder ([place]) 03/12/2015  . Controlled type 2 diabetes mellitus without complication ([place]) 40/98/1191  . BP (high blood pressure) 12/04/2013  . 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 SKIN CANCER EXCISION      Prior to Admission medications   Medication  Sig Start Date End Date Taking? Authorizing Provider  ibuprofen (ADVIL) 400 MG tablet Take 1 tablet (400 mg total) by mouth every 8 (eight) hours as needed for up to 7 days. 08/20/20 08/27/20 Yes Duffy Bruce, MD  oxyCODONE (ROXICODONE) 5 MG immediate release tablet Take 0.5-1 tablets (2.5-5 mg total) by mouth every 6 (six) hours as needed for moderate pain or severe pain. 08/20/20 08/20/21 Yes Duffy Bruce, MD  FLUoxetine (PROZAC) 10 MG capsule Take 10 mg by mouth daily.    [provider]    Allergies Patient has no known allergies.  Family History  Problem Relation Age of Onset  . Breast cancer Maternal Grandmother 47  . Colon cancer Maternal Grandmother 56  . Colon cancer Paternal Grandmother 43  . Kidney cancer Mother 72  . Rectal cancer Mother 46  . Ovarian cancer Mother 52  . Colon cancer Maternal Uncle 55  . Colon cancer Other 28       Paternal Great Aunt  . Colon cancer Other 39       Paternal Great Aunt  . Arthritis Other        mother, Father  . Diabetes Other   . Hypertension Other   . Heart disease Other        Mother, Father  . Osteoporosis Other     Social History Social History   Tobacco Use  . Smoking  status: Current Every Day Smoker    Packs/day: 0.50    Years: 40.00    Pack years: 20.00    Types: Cigarettes, E-cigarettes  . Smokeless tobacco: Never Used  Vaping Use  . Vaping Use: Every day  . Substances: Nicotine  Substance Use Topics  . Alcohol use: No  . Drug use: No    Review of Systems  Review of Systems  Constitutional: Negative for fatigue and fever.  HENT: Negative for congestion and sore throat.   Eyes: Negative for visual disturbance.  Respiratory: Negative for cough and shortness of breath.   Cardiovascular: Positive for chest pain.  Gastrointestinal: Negative for abdominal pain, diarrhea, nausea and vomiting.  Genitourinary: Negative for flank pain.  Musculoskeletal: Positive for back pain. Negative for neck pain.   Skin: Negative for rash and wound.  Neurological: Negative for weakness.  All other systems reviewed and are negative.    ____________________________________________  PHYSICAL EXAM:      VITAL SIGNS: ED Triage Vitals  Enc Vitals Group     BP 08/20/20 1334 (!) 159/87     Pulse Rate 08/20/20 1334 89     Resp 08/20/20 1334 18     Temp 08/20/20 1334 98.1 F (36.7 C)     Temp Source 08/20/20 1334 Oral     SpO2 08/20/20 1334 97 %     Weight 08/20/20 1335 137 lb (62.1 kg)     Height 08/20/20 1335 5\' 2"  (1.575 m)     Head Circumference --      Peak Flow --      Pain Score 08/20/20 1335 0     Pain Loc --      Pain Edu? --      Excl. in [place]? --      Physical Exam Vitals and nursing note reviewed.  Constitutional:      General: She is not in acute distress.    Appearance: She is well-developed.     Comments: Appears uncomfortable with movement  HENT:     Head: Normocephalic and atraumatic.  Eyes:     Conjunctiva/sclera: Conjunctivae normal.  Cardiovascular:     Rate and Rhythm: Normal rate and regular rhythm.     Heart sounds: Normal heart sounds.  Pulmonary:     Effort: Pulmonary effort is normal. No respiratory distress.     Breath sounds: No wheezing.  Abdominal:     General: There is no distension.  Musculoskeletal:     Cervical back: Neck supple.     Comments: Moderate midline thoracic and lumbar spine tenderness.  Specific tenderness along mid to lower thoracic spine, worse on the left of midline.  No step-offs or deformity.  Skin:    General: Skin is warm.     Capillary Refill: Capillary refill takes less than 2 seconds.     Findings: No rash.  Neurological:     Mental Status: She is alert and oriented to person, place, and time.     Motor: No abnormal muscle tone.       ____________________________________________   LABS (all labs ordered are listed, but only abnormal results are displayed)  Labs Reviewed  BASIC METABOLIC PANEL - Abnormal; Notable  for the following components:      Result Value   Glucose, Bld 138 (*)    Creatinine, Ser 0.39 (*)    All other components within normal limits  CBC  TROPONIN I (HIGH SENSITIVITY)  TROPONIN I (HIGH SENSITIVITY)    ____________________________________________  EKG:  Normal sinus rhythm with PACs.  Ventricular rate 90.  PR 152, QRS 84, QTc 455.  No acute ST elevations or depressions. ________________________________________  RADIOLOGY All imaging, including plain films, CT scans, and ultrasounds, independently reviewed by me, and interpretations confirmed via formal radiology reads.  ED MD interpretation:   Chest x-ray: Clear */*  Official radiology report(s): DG Chest 2 View  Result Date: 08/20/2020 CLINICAL DATA:  Chest pain. EXAM: CHEST - 2 VIEW COMPARISON:  June 26, 2018. FINDINGS: The heart size and mediastinal contours are within normal limits. Both lungs are clear. The visualized skeletal structures are unremarkable. IMPRESSION: No active cardiopulmonary disease. Electronically Signed   By: Marijo Conception M.D.   On: 08/20/2020 14:27   CT Thoracic Spine Wo Contrast  Result Date: 08/20/2020 CLINICAL DATA:  Back pain fall 1 month ago EXAM: CT THORACIC SPINE WITHOUT CONTRAST TECHNIQUE: Multidetector CT images of the thoracic were obtained using the standard protocol without intravenous contrast. COMPARISON:  CT 08/06/2020 FINDINGS: Alignment: Normal. Vertebrae: Mild age indeterminate superior endplate deformities at T3 and T6. Mild superior endplate deformity at 624THL with slight sclerosis. Less than 10% loss of vertebral body height at this level. Paraspinal and other soft tissues: Slightly indistinct paravertebral soft tissues at T12. Paravertebral and paraspinal soft tissues otherwise unremarkable Disc levels: Mild degenerative osteophytes at multiple levels. IMPRESSION: 1. Mild superior endplate deformity at 624THL with similar minimal loss of height as compared with 08/06/2020 CT. Mild  paravertebral edema suggests subacute process. 2. Age indeterminate mild superior endplate deformities at T3 and T6. Electronically Signed   By: Donavan Foil M.D.   On: 08/20/2020 17:27   CT Lumbar Spine Wo Contrast  Result Date: 08/20/2020 CLINICAL DATA:  [place] 1 month ago with back pain EXAM: CT LUMBAR SPINE WITHOUT CONTRAST TECHNIQUE: Multidetector CT imaging of the lumbar spine was performed without intravenous contrast administration. Multiplanar CT image reconstructions were also generated. COMPARISON:  CT 08/06/2020 FINDINGS: Segmentation: 5 lumbar type vertebrae. Alignment: 3 mm retrolisthesis L3 on L4 and L4 on L5. Vertebrae: Vertebral body heights are maintained. No fracture is seen. Paraspinal and other soft tissues: Negative. Disc levels: At T12-L1, maintained disc space.  No canal stenosis. At L1-L2, maintained disc space. No canal stenosis. Foramen are patent bilaterally. At L2-L3, moderate disc space narrowing with osteophyte. No significant canal stenosis. Mild facet degenerative changes. No significant foraminal narrowing. At L3-L4, maintained disc space. No canal stenosis. Mild ligamentum flavum thickening and bilateral facet degenerative changes. The foramen are patent bilaterally. At L5-S1, mild disc space narrowing and degenerative osteophyte. Mild broad-based disc bulge. Ligamentum flavum thickening and hypertrophic facet degenerative change, results in mild canal stenosis. Mild bilateral inferior foraminal narrowing. At L5-S1, maintained disc space. No significant canal stenosis. Hypertrophic facet degenerative changes. The foramen are patent bilaterally. IMPRESSION: 1. No acute osseous abnormality. 2. Multilevel degenerative changes. Trace retrolisthesis L3 on L4 and L4 on L5 likely due to degenerative disease. Electronically Signed   By: Donavan Foil M.D.   On: 08/20/2020 17:20    ____________________________________________  PROCEDURES   Procedure(s) performed (including  Critical Care):  Procedures  ____________________________________________  INITIAL IMPRESSION / MDM / [place] / ED COURSE  As part of my medical decision making, I reviewed the following data within the [facility] notes reviewed and incorporated, Old chart reviewed, Notes from prior ED visits, and [place] Controlled Substance Database       *Giavanna Kerby was evaluated in Emergency Department  on 08/21/2020 for the symptoms described in the history of present illness. She was evaluated in the context of the global COVID-19 pandemic, which necessitated consideration that the patient might be at risk for infection with the SARS-CoV-2 virus that causes COVID-19. Institutional protocols and algorithms that pertain to the evaluation of patients at risk for COVID-19 are in a state of rapid change based on information released by regulatory bodies including the CDC and federal and state organizations. These policies and algorithms were followed during the patient's care in the ED.  Some ED evaluations and interventions may be delayed as a result of limited staffing during the pandemic.*     Medical Decision Making:  70 yo F here with sharp, stabbing back pain radiating around her chest. [place] several weeks ago. Pt is HDS and well appearing. Neurologically intact b/l Ue and LE with intact strength and sensation. Suspect her pain is related to several T spine compression fx. It is sharp, positional, and reproducible. EKG nonischemic and trop neg, do not suspect referred cardiac pain. CXR is clear without PNA or other source. Labs otherwise very reassuring. CT T/L spine obtained and reviewed as above, show T3,T6,T12 minor compression fx. No retropulsion or signs of cord compression ro cauda equina. Discussed with Dr. Izora Ribas with [facility]. Pt placed in TLSO, will give analgesia and outpt follow-up. Feels much better in ED and pain well controlled. Return precautions  given.  ____________________________________________  FINAL CLINICAL IMPRESSION(S) / ED DIAGNOSES  Final diagnoses:  Compression fracture of T6 vertebra, initial encounter ([place])  Compression fracture of T3 vertebra, initial encounter ([place])  Compression fracture of T12 vertebra, initial encounter ([place])     MEDICATIONS GIVEN DURING THIS VISIT:  Medications  acetaminophen (TYLENOL) tablet 650 mg (650 mg Oral Given 08/20/20 1651)  ketorolac (TORADOL) 30 MG/ML injection 15 mg (15 mg Intravenous Given 08/20/20 1652)  oxyCODONE-acetaminophen (PERCOCET/ROXICET) 5-325 MG per tablet 1 tablet (1 tablet Oral Given 08/20/20 1652)     ED Discharge Orders         Ordered    oxyCODONE (ROXICODONE) 5 MG immediate release tablet  Every 6 hours PRN        08/20/20 2040    ibuprofen (ADVIL) 400 MG tablet  Every 8 hours PRN        08/20/20 2040           Note:  This document was prepared using Dragon voice recognition software and may include unintentional dictation errors.   Duffy Bruce, MD 08/21/20 [phone]

## 2020-08-20 NOTE — ED Triage Notes (Signed)
Pt arrives via pov ambulatory to triage. Pt  c/o CP starting on Monday. Pt states when sitting still pain is gone. Pain increases with movement and when lifting something. NAD noted at this time.

## 2020-08-22 DIAGNOSIS — S32000A Wedge compression fracture of unspecified lumbar vertebra, initial encounter for closed fracture: Secondary | ICD-10-CM | POA: Insufficient documentation

## 2020-08-22 DIAGNOSIS — R600 Localized edema: Secondary | ICD-10-CM | POA: Insufficient documentation

## 2020-08-22 DIAGNOSIS — I499 Cardiac arrhythmia, unspecified: Secondary | ICD-10-CM | POA: Insufficient documentation

## 2020-08-22 DIAGNOSIS — R609 Edema, unspecified: Secondary | ICD-10-CM | POA: Insufficient documentation

## 2020-08-24 ENCOUNTER — Ambulatory Visit (INDEPENDENT_AMBULATORY_CARE_PROVIDER_SITE_OTHER): Payer: Medicare Other | Admitting: Urology

## 2020-08-24 ENCOUNTER — Other Ambulatory Visit: Payer: Self-pay

## 2020-08-24 ENCOUNTER — Encounter: Payer: Self-pay | Admitting: Urology

## 2020-08-24 VITALS — BP 160/81 | HR 48 | Ht 62.0 in | Wt 136.0 lb

## 2020-08-24 DIAGNOSIS — Z87442 Personal history of urinary calculi: Secondary | ICD-10-CM

## 2020-08-24 DIAGNOSIS — N200 Calculus of kidney: Secondary | ICD-10-CM | POA: Diagnosis not present

## 2020-08-24 NOTE — Patient Instructions (Signed)
Dietary Guidelines to Help Prevent Kidney Stones Kidney stones are deposits of minerals and salts that form inside your kidneys. Your risk of developing kidney stones may be greater depending on your diet, your lifestyle, the medicines you take, and whether you have certain medical conditions. Most people can lower their chances of developing kidney stones by following the instructions below. Your dietitian may give you more specific instructions depending on your overall health and the type of kidney stones you tend to develop. What are tips for following this plan? Reading food labels  Choose foods with "no salt added" or "low-salt" labels. Limit your salt (sodium) intake to less than 1,500 mg a day.  Choose foods with calcium for each meal and snack. Try to eat about 300 mg of calcium at each meal. Foods that contain 200-500 mg of calcium a serving include: ? 8 oz (237 mL) of milk, calcium-fortifiednon-dairy milk, and calcium-fortifiedfruit juice. Calcium-fortified means that calcium has been added to these drinks. ? 8 oz (237 mL) of kefir, yogurt, and soy yogurt. ? 4 oz (114 g) of tofu. ? 1 oz (28 g) of cheese. ? 1 cup (150 g) of dried figs. ? 1 cup (91 g) of cooked broccoli. ? One 3 oz (85 g) can of sardines or mackerel. Most people need 1,000-1,500 mg of calcium a day. Talk to your dietitian about how much calcium is recommended for you.   Shopping  Buy plenty of fresh fruits and vegetables. Most people do not need to avoid fruits and vegetables, even if these foods contain nutrients that may contribute to kidney stones.  When shopping for convenience foods, choose: ? Whole pieces of fruit. ? Pre-made salads with dressing on the side. ? Low-fat fruit and yogurt smoothies.  Avoid buying frozen meals or prepared deli foods. These can be high in sodium.  Look for foods with live cultures, such as yogurt and kefir.  Choose high-fiber grains, such as whole-wheat breads, oat bran, and  wheat cereals. Cooking  Do not add salt to food when cooking. Place a salt shaker on the table and allow each person to add his or her own salt to taste.  Use vegetable protein, such as beans, textured vegetable protein (TVP), or tofu, instead of meat in pasta, casseroles, and soups. Meal planning  Eat less salt, if told by your dietitian. To do this: ? Avoid eating processed or pre-made food. ? Avoid eating fast food.  Eat less animal protein, including cheese, meat, poultry, or fish, if told by your dietitian. To do this: ? Limit the number of times you have meat, poultry, fish, or cheese each week. Eat a diet free of meat at least 2 days a week. ? Eat only one serving each day of meat, poultry, fish, or seafood. ? When you prepare animal protein, cut pieces into small portion sizes. For most meat and fish, one serving is about the size of the palm of your hand.  Eat at least five servings of fresh fruits and vegetables each day. To do this: ? Keep fruits and vegetables on hand for snacks. ? Eat one piece of fruit or a handful of berries with breakfast. ? Have a salad and fruit at lunch. ? Have two kinds of vegetables at dinner.  Limit foods that are high in a substance called oxalate. These include: ? Spinach (cooked), rhubarb, beets, sweet potatoes, and Swiss chard. ? Peanuts. ? Potato chips, french fries, and baked potatoes with skin on. ? Nuts and   nut products. ? Chocolate.  If you regularly take a diuretic medicine, make sure to eat at least 1 or 2 servings of fruits or vegetables that are high in potassium each day. These include: ? Avocado. ? Banana. ? Orange, prune, carrot, or tomato juice. ? Baked potato. ? Cabbage. ? Beans and split peas. Lifestyle  Drink enough fluid to keep your urine pale yellow. This is the most important thing you can do. Spread your fluid intake throughout the day.  If you drink alcohol: ? Limit how much you use to:  0-1 drink a day for  women who are not pregnant.  0-2 drinks a day for men. ? Be aware of how much alcohol is in your drink. In the [place] one drink equals one 12 oz bottle of beer (355 mL), one 5 oz glass of wine (148 mL), or one 1 oz glass of hard liquor (44 mL).  Lose weight if told by your health care provider. Work with your dietitian to find an eating plan and weight loss strategies that work best for you.   General information  Talk to your health care provider and dietitian about taking daily supplements. You may be told the following depending on your health and the cause of your kidney stones: ? Not to take supplements with vitamin C. ? To take a calcium supplement. ? To take a daily probiotic supplement. ? To take other supplements such as magnesium, fish oil, or vitamin B6.  Take over-the-counter and prescription medicines only as told by your health care provider. These include supplements. What foods should I limit? Limit your intake of the following foods, or eat them as told by your dietitian. Vegetables Spinach. Rhubarb. Beets. Canned vegetables. Pickles. Olives. Baked potatoes with skin. Grains Wheat bran. Baked goods. Salted crackers. Cereals high in sugar. Meats and other proteins Nuts. Nut butters. Large portions of meat, poultry, or fish. Salted, precooked, or cured meats, such as sausages, meat loaves, and hot dogs. Dairy Cheese. Beverages Regular soft drinks. Regular vegetable juice. Seasonings and condiments Seasoning blends with salt. Salad dressings. Soy sauce. Ketchup. Barbecue sauce. Other foods Canned soups. Canned pasta sauce. Casseroles. Pizza. Lasagna. Frozen meals. Potato chips. French fries. The items listed above may not be a complete list of foods and beverages you should limit. Contact a dietitian for more information. What foods should I avoid? Talk to your dietitian about specific foods you should avoid based on the type of kidney stones you have and your overall  health. Fruits Grapefruit. The item listed above may not be a complete list of foods and beverages you should avoid. Contact a dietitian for more information. Summary  Kidney stones are deposits of minerals and salts that form inside your kidneys.  You can lower your risk of kidney stones by making changes to your diet.  The most important thing you can do is drink enough fluid. Drink enough fluid to keep your urine pale yellow.  Talk to your dietitian about how much calcium you should have each day, and eat less salt and animal protein as told by your dietitian. This information is not intended to replace advice given to you by your health care provider. Make sure you discuss any questions you have with your health care provider. Document Revised: 03/14/2019 Document Reviewed: 03/14/2019 Elsevier Patient Education  2021 Elsevier Inc.  

## 2020-08-24 NOTE — Progress Notes (Signed)
08/24/20 5:24 PM   Olivia Alvarado [birth year] [id number]  CC: left ureteral stone  HPI: I saw Ms.Vahle in urology clinic for the above issues.  She is a 70 year old female who presented to the ED on 08/06/2020 for left-sided flank pain.  CT at that time showed a 2 mm left distal ureteral stone with mild hydronephrosis, and urine culture ultimately showed no growth.  She was discharged with medical expulsive therapy.  Her pain resolved after a few days.  Unfortunately she had a fall, and was seen again in the ED on 08/20/2020 and thoracic CT showed possible fracture with some paravertebral edema.  She denies any recurrence of her left-sided flank pain similar to the kidney stone event.  She has no prior history of kidney stones.  Urinalysis today is benign.   PMH: Past Medical History:  Diagnosis Date  . Atrophic vaginitis   . IBS (irritable bowel syndrome)   . Kidney stone   . Lower back pain   . Nocturia   . OAB (overactive bladder)   . Skin cancer    BCC  . Squamous carcinoma   . Suprapubic pressure   . Urethral stricture   . Urinary frequency   . Urinary incontinence, nocturnal enuresis   . Vaginal atrophy   . Vulvar lesion     Surgical History: Past Surgical History:  Procedure Laterality Date  . SKIN CANCER EXCISION      Family History: Family History  Problem Relation Age of Onset  . Breast cancer Maternal Grandmother 74  . Colon cancer Maternal Grandmother 44  . Colon cancer Paternal Grandmother 3  . Kidney cancer Mother 76  . Rectal cancer Mother 53  . Ovarian cancer Mother 67  . Colon cancer Maternal Uncle 68  . Colon cancer Other 41       Paternal Great Aunt  . Colon cancer Other 56       Paternal Great Aunt  . Arthritis Other        mother, Father  . Diabetes Other   . Hypertension Other   . Heart disease Other        Mother, Father  . Osteoporosis Other     Social History:  reports that she has been smoking cigarettes and  e-cigarettes. She has a 20.00 pack-year smoking history. She has never used smokeless tobacco. She reports that she does not drink alcohol and does not use drugs.  Physical Exam: BP (!) 160/81 (BP Location: Left Arm, Patient Position: Sitting, Cuff Size: Normal)   Pulse (!) 48   Ht 5\' 2"  (1.575 m)   Wt 136 lb (61.7 kg)   BMI 24.87 kg/m    Constitutional:  Alert and oriented, No acute distress. Cardiovascular: No clubbing, cyanosis, or edema. Respiratory: Normal respiratory effort, no increased work of breathing. GI: Abdomen is soft, nontender, nondistended, no abdominal masses  Laboratory Data: Reviewed, see HPI  Pertinent Imaging: I have personally viewed and interpreted the CT stone protocol dated 08/06/2020 with a small 2 mm left distal ureteral stone.  There was no follow-up hydronephrosis on the CT from 5/19, no definite evidence of ureteral stone.  Assessment & Plan:   70 year old female who likely spontaneously passed a small left distal ureteral stone, and has some residual midline back pain secondary to a vertebral compression fracture.  Urinalysis is benign today, and follow-up CT showed resolution of prior hydronephrosis.  We discussed general stone prevention strategies including adequate hydration with goal of producing 2.5 L  of urine daily, increasing citric acid intake, increasing calcium intake during high oxalate meals, minimizing animal protein, and decreasing salt intake. Information about dietary recommendations given today.   Follow-up with urology as needed  Nickolas Madrid, MD 08/24/2020  [facility] [address], [place], [place] [address] [phone]

## 2020-08-25 LAB — URINALYSIS, COMPLETE
Bilirubin, UA: NEGATIVE
Glucose, UA: NEGATIVE
Ketones, UA: NEGATIVE
Nitrite, UA: NEGATIVE
Protein,UA: NEGATIVE
Specific Gravity, UA: 1.02 (ref 1.005–1.030)
Urobilinogen, Ur: 0.2 mg/dL (ref 0.2–1.0)
pH, UA: 5 (ref 5.0–7.5)

## 2020-08-25 LAB — MICROSCOPIC EXAMINATION: Bacteria, UA: NONE SEEN

## 2020-09-03 ENCOUNTER — Other Ambulatory Visit: Payer: Self-pay

## 2020-09-03 ENCOUNTER — Other Ambulatory Visit: Payer: Self-pay | Admitting: Orthopedic Surgery

## 2020-09-03 ENCOUNTER — Ambulatory Visit
Admission: RE | Admit: 2020-09-03 | Discharge: 2020-09-03 | Disposition: A | Discharge status: Home / Self Care | Payer: Medicare Other | Source: Ambulatory Visit | Attending: Orthopedic Surgery | Admitting: Orthopedic Surgery

## 2020-09-03 DIAGNOSIS — S22030A Wedge compression fracture of third thoracic vertebra, initial encounter for closed fracture: Secondary | ICD-10-CM | POA: Diagnosis present

## 2020-09-03 IMAGING — MR MR THORACIC SPINE W/O CM
6 series · 32 of 48 positions shown · non-contrast
Comparison: CT thoracic spine [DATE]

CLINICAL DATA: Fall with mid back pain

EXAM:
MRI THORACIC SPINE WITHOUT CONTRAST
TECHNIQUE: Multiplanar, multisequence MR imaging of the thoracic spine was
performed. No intravenous contrast was administered.

[Series 16: T1 · sagittal · 5.0mm · 1.88mm/px · 4 of 9 slices shown (1 of 2)]
[im 1/9]
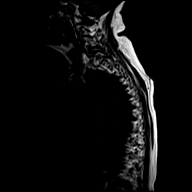
[im 3/9]
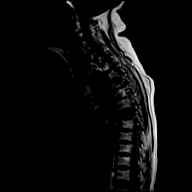
[im 6/9]
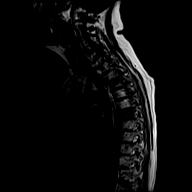
[im 9/9]
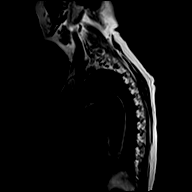

[Series 17: T2 · sagittal · 3.0mm · 1.06mm/px · 6 of 19 slices shown (1 of 2)]
[im 1/19]
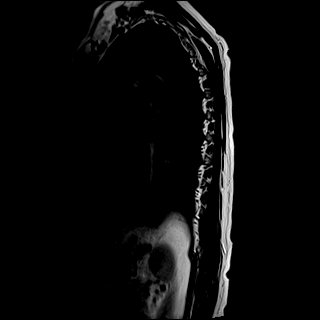
[im 4/19]
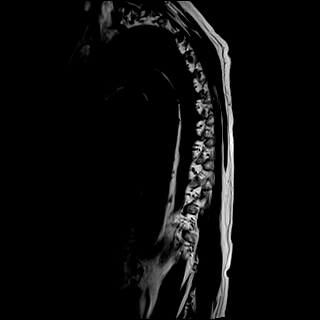
[im 8/19]
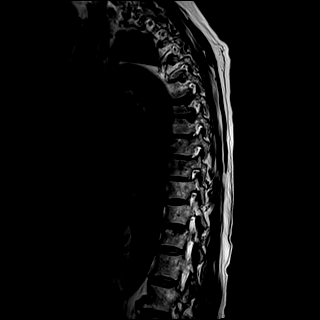
[im 11/19]
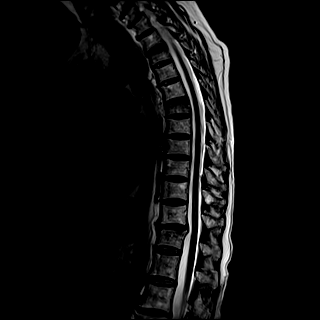
[im 15/19]
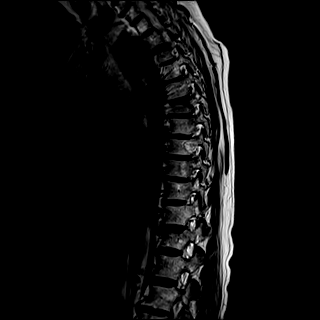
[im 19/19]
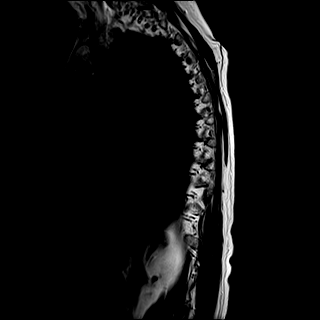

[Series 18: T1 · sagittal · 3.0mm · 1.06mm/px · 6 of 19 slices shown (2 of 2)]
[im 1/19]
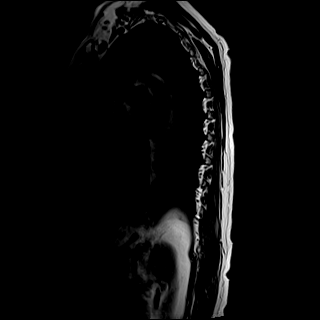
[im 4/19]
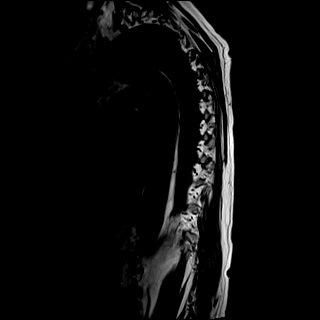
[im 8/19]
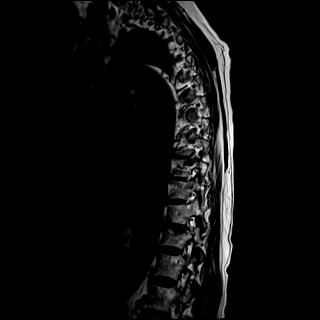
[im 11/19]
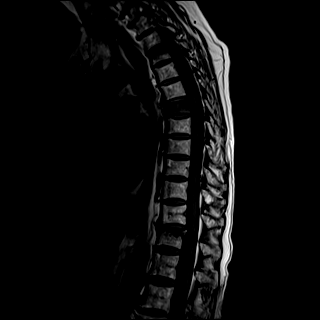
[im 15/19]
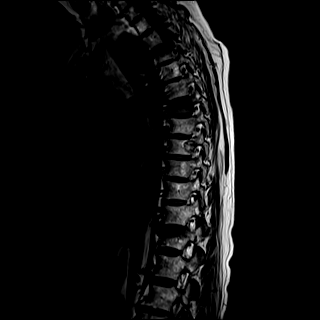
[im 19/19]
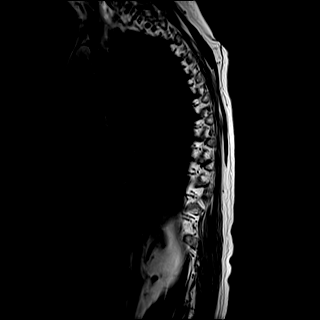

[Series 19: STIR · sagittal · 3.0mm · 0.53mm/px · 6 of 19 slices shown]
[im 1/19]
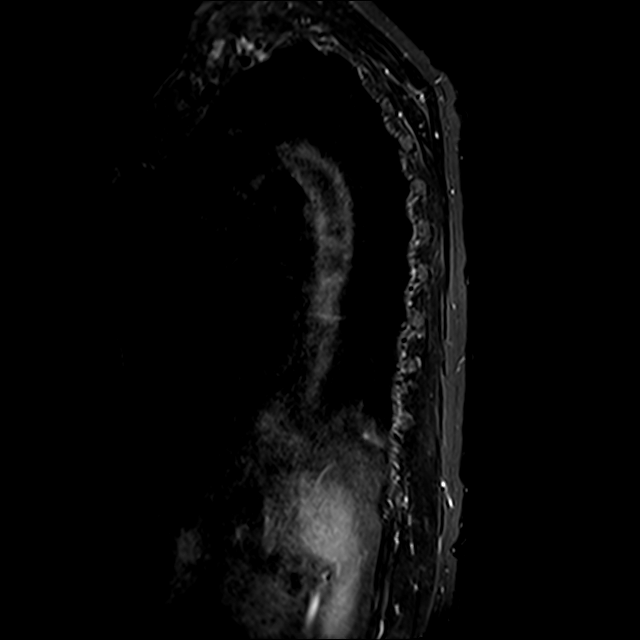
[im 4/19]
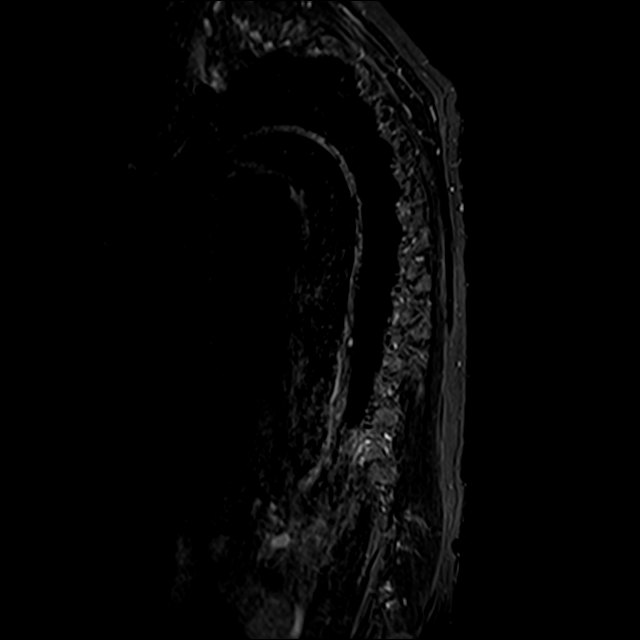
[im 8/19]
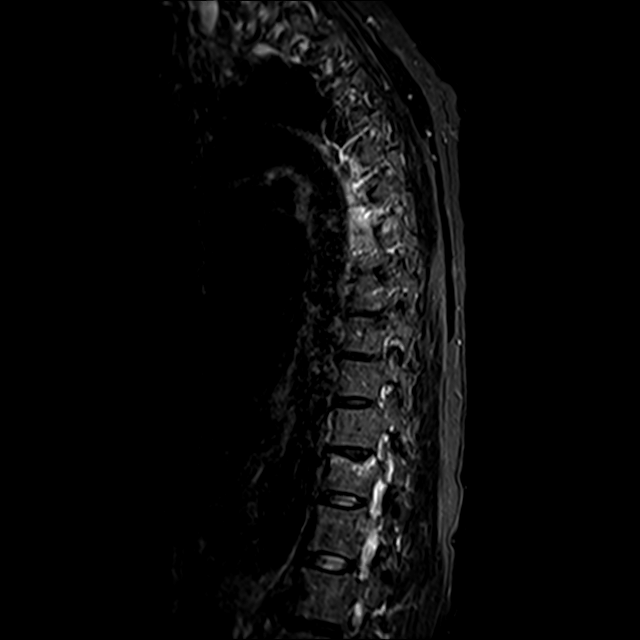
[im 11/19]
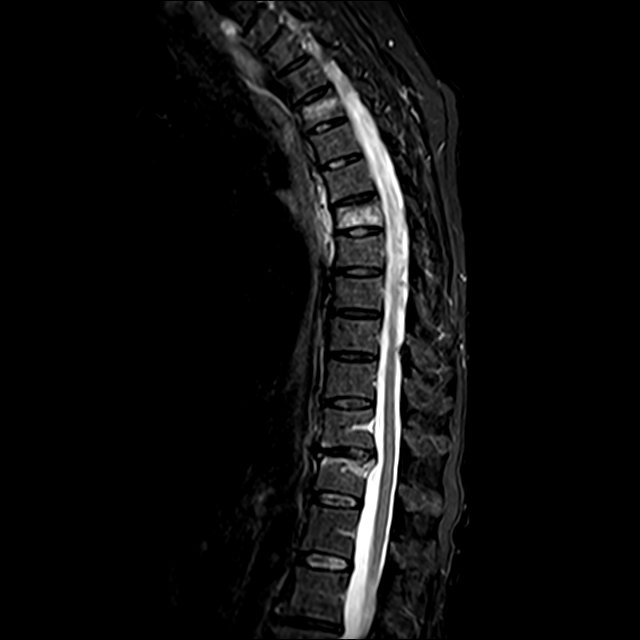
[im 15/19]
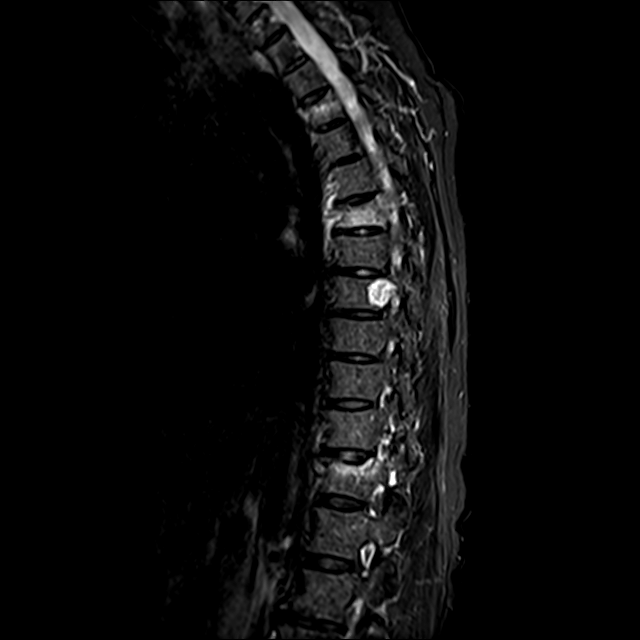
[im 19/19]
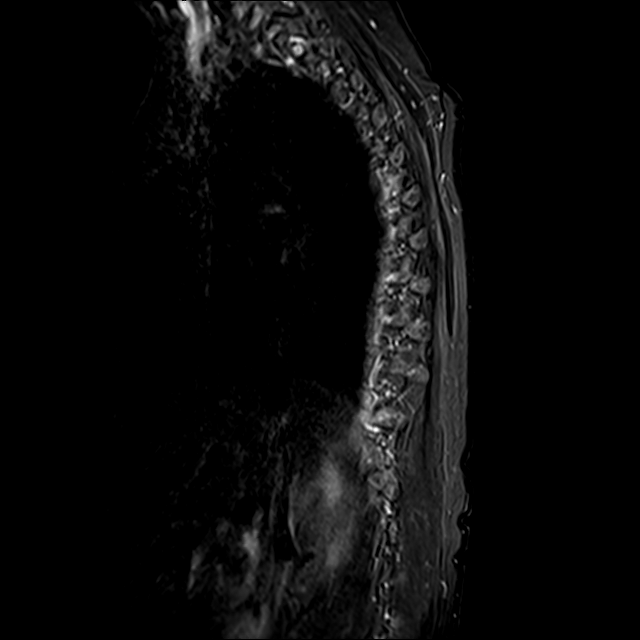

[Series 20: T2 · axial · 4.0mm · 0.59mm/px · z∈[-295,-112]mm · 9 of 39 slices shown (2 of 2)]
[im 1/39]
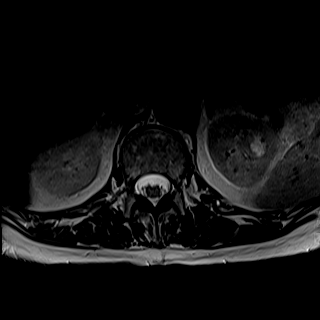
[im 7/39]
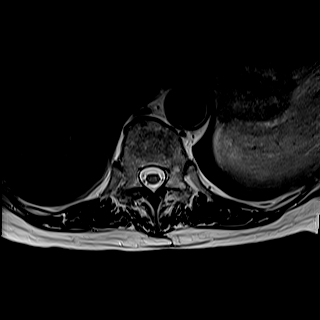
[im 13/39]
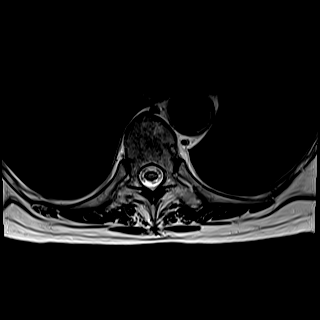
[im 16/39]
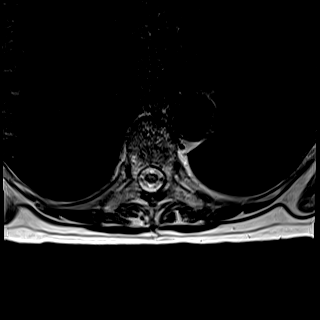
[im 20/39]
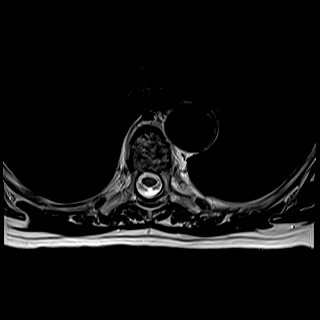
[im 23/39]
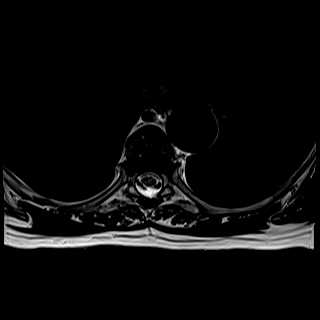
[im 26/39]
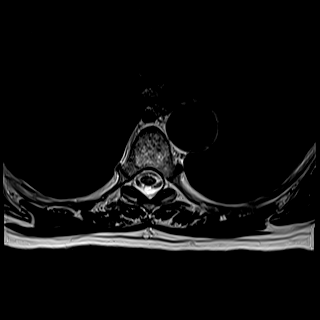
[im 32/39]
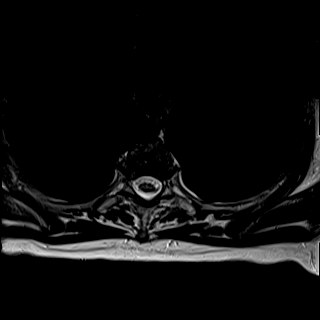
[im 39/39]
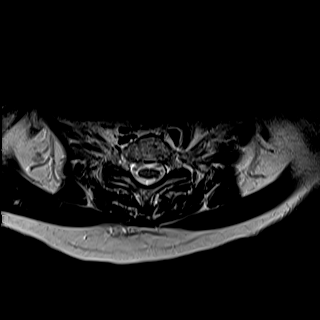

[Series 21: GRE · axial · 4.0mm · 0.37mm/px · 1 of 39 slices shown]
[im 1/39]
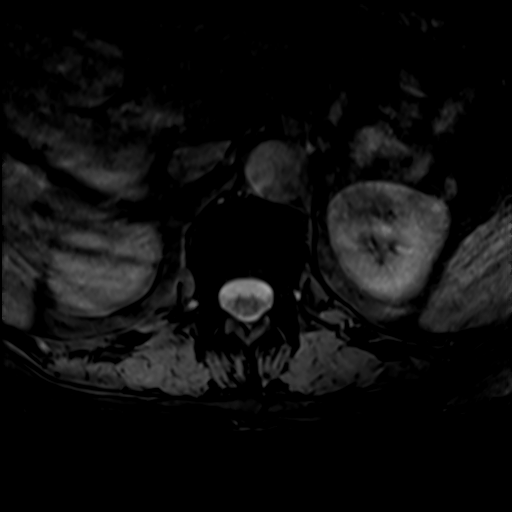

[32 of 48 positions shown; findings below may reference images not displayed]

FINDINGS: Alignment:  Physiologic.

Vertebrae: Mild wedge compression deformities at T3, T6 and T12 are
unchanged. Marrow edema is greatest at T6, but is present at all 3
levels. Height loss less than 25% at all levels. 3 mm retropulsion
at T12. There is a hemangioma at T8.

Cord:  Normal signal and morphology.

Paraspinal and other soft tissues: Negative.

Disc levels:

No spinal canal or neural foraminal stenosis.
IMPRESSION: Acute/subacute wedge compression fractures of T3, T6 and T12,
unchanged. 3 mm retropulsion at T12 with no associated stenosis.

## 2020-09-11 ENCOUNTER — Encounter
Admission: RE | Admit: 2020-09-11 | Discharge: 2020-09-11 | Disposition: A | Discharge status: Home / Self Care | Payer: Medicare Other | Source: Ambulatory Visit | Attending: Orthopedic Surgery | Admitting: Orthopedic Surgery

## 2020-09-11 ENCOUNTER — Other Ambulatory Visit: Payer: Self-pay

## 2020-09-11 ENCOUNTER — Other Ambulatory Visit: Payer: Self-pay | Admitting: Orthopedic Surgery

## 2020-09-11 HISTORY — DX: Prediabetes: R73.03

## 2020-09-11 NOTE — Patient Instructions (Signed)
Your procedure is scheduled on: 09/15/20 Report to [place]. To find out your arrival time please call [phone] between 1PM - 3PM on 09/14/20.  Remember: Instructions that are not followed completely may result in serious medical risk, up to and including death, or upon the discretion of your surgeon and anesthesiologist your surgery may need to be rescheduled.     _X__ 1. Do not eat food after midnight the night before your procedure.                 No gum chewing or hard candies. You may drink clear liquids up to 2 hours                 before you are scheduled to arrive for your surgery- DO not drink clear                 liquids within 2 hours of the start of your surgery.                 Clear Liquids include:  water, apple juice without pulp, clear carbohydrate                 drink such as Clearfast or Gatorade, Black Coffee or Tea (Do not add                 anything to coffee or tea). Diabetics water only  __X__2.  On the morning of surgery brush your teeth with toothpaste and water, you                 may rinse your mouth with mouthwash if you wish.  Do not swallow any              toothpaste of mouthwash.     _X__ 3.  No Alcohol for 24 hours before or after surgery.   _X__ 4.  Do Not Smoke or use e-cigarettes For 24 Hours Prior to Your Surgery.                 Do not use any chewable tobacco products for at least 6 hours prior to                 surgery.  ____  5.  Bring all medications with you on the day of surgery if instructed.   __X__  6.  Notify your doctor if there is any change in your medical condition      (cold, fever, infections).     Do not wear jewelry, make-up, hairpins, clips or nail polish. Do not wear lotions, powders, or perfumes.  Do not shave 48 hours prior to surgery. Men may shave face and neck. Do not bring valuables to the hospital.    [facility] is not responsible for any belongings or  valuables.  Contacts, dentures/partials or body piercings may not be worn into surgery. Bring a case for your contacts, glasses or hearing aids, a denture cup will be supplied. Leave your suitcase in the car. After surgery it may be brought to your room. For patients admitted to the hospital, discharge time is determined by your treatment team.   Patients discharged the day of surgery will not be allowed to drive home.   Please read over the following fact sheets that you were given:    __X__ Take these medicines the morning of surgery with A SIP OF WATER:    1.  atorvastatin (LIPITOR) 10 MG tablet  2. FLUoxetine (PROZAC) 10 MG capsule  3. oxyCODONE (ROXICODONE) 5 MG immediate release tablet if needed  4.  5.  6.  ____ Fleet Enema (as directed)   ____ Use CHG Soap/SAGE wipes as directed  ____ Use inhalers on the day of surgery  ____ Stop metformin/Janumet/Farxiga 2 days prior to surgery    ____ Take 1/2 of usual insulin dose the night before surgery. No insulin the morning          of surgery.   ____ Stop Blood Thinners Coumadin/Plavix/Xarelto/Pleta/Pradaxa/Eliquis/Effient/Aspirin  on   Or contact your Surgeon, Cardiologist or Medical Doctor regarding  ability to stop your blood thinners  __X__ Stop Anti-inflammatories 7 days before surgery such as Advil, Ibuprofen, Motrin,  BC or Goodies Powder, Naprosyn, Naproxen, Aleve, Aspirin   You may take Tylenol if needed  __X__ Stop all herbal supplements, fish oil or vitamin E until after surgery.    ____ Bring C-Pap to the hospital.

## 2020-09-15 ENCOUNTER — Ambulatory Visit: Payer: Medicare Other | Admitting: Certified Registered Nurse Anesthetist

## 2020-09-15 ENCOUNTER — Encounter
Admission: RE | Disposition: A | Discharge status: Home / Self Care | Payer: Self-pay | Source: Home / Self Care | Attending: Orthopedic Surgery

## 2020-09-15 ENCOUNTER — Other Ambulatory Visit: Payer: Self-pay

## 2020-09-15 ENCOUNTER — Ambulatory Visit
Admission: RE | Admit: 2020-09-15 | Discharge: 2020-09-15 | Disposition: A | Discharge status: Home / Self Care | Payer: Medicare Other | Attending: Orthopedic Surgery | Admitting: Orthopedic Surgery

## 2020-09-15 ENCOUNTER — Encounter: Payer: Self-pay | Admitting: Orthopedic Surgery

## 2020-09-15 ENCOUNTER — Ambulatory Visit: Payer: Medicare Other

## 2020-09-15 DIAGNOSIS — Z888 Allergy status to other drugs, medicaments and biological substances status: Secondary | ICD-10-CM | POA: Insufficient documentation

## 2020-09-15 DIAGNOSIS — M4854XA Collapsed vertebra, not elsewhere classified, thoracic region, initial encounter for fracture: Secondary | ICD-10-CM | POA: Insufficient documentation

## 2020-09-15 DIAGNOSIS — Z79899 Other long term (current) drug therapy: Secondary | ICD-10-CM | POA: Diagnosis not present

## 2020-09-15 DIAGNOSIS — Z419 Encounter for procedure for purposes other than remedying health state, unspecified: Secondary | ICD-10-CM

## 2020-09-15 HISTORY — PX: KYPHOPLASTY: SHX5884

## 2020-09-15 HISTORY — DX: Personal history of urinary calculi: Z87.442

## 2020-09-15 IMAGING — RF DG THORACIC SPINE 2V
1 series · 2 of 2 positions shown · non-contrast
Comparison: [DATE]

CLINICAL DATA: T6 and T12 compression deformities

EXAM:
THORACIC SPINE 2 VIEWS; DG C-ARM 1-60 MIN

[Series 1: dg x-ray · 0.14mm/px · 2 of 2 slices shown]
[im 1/2]
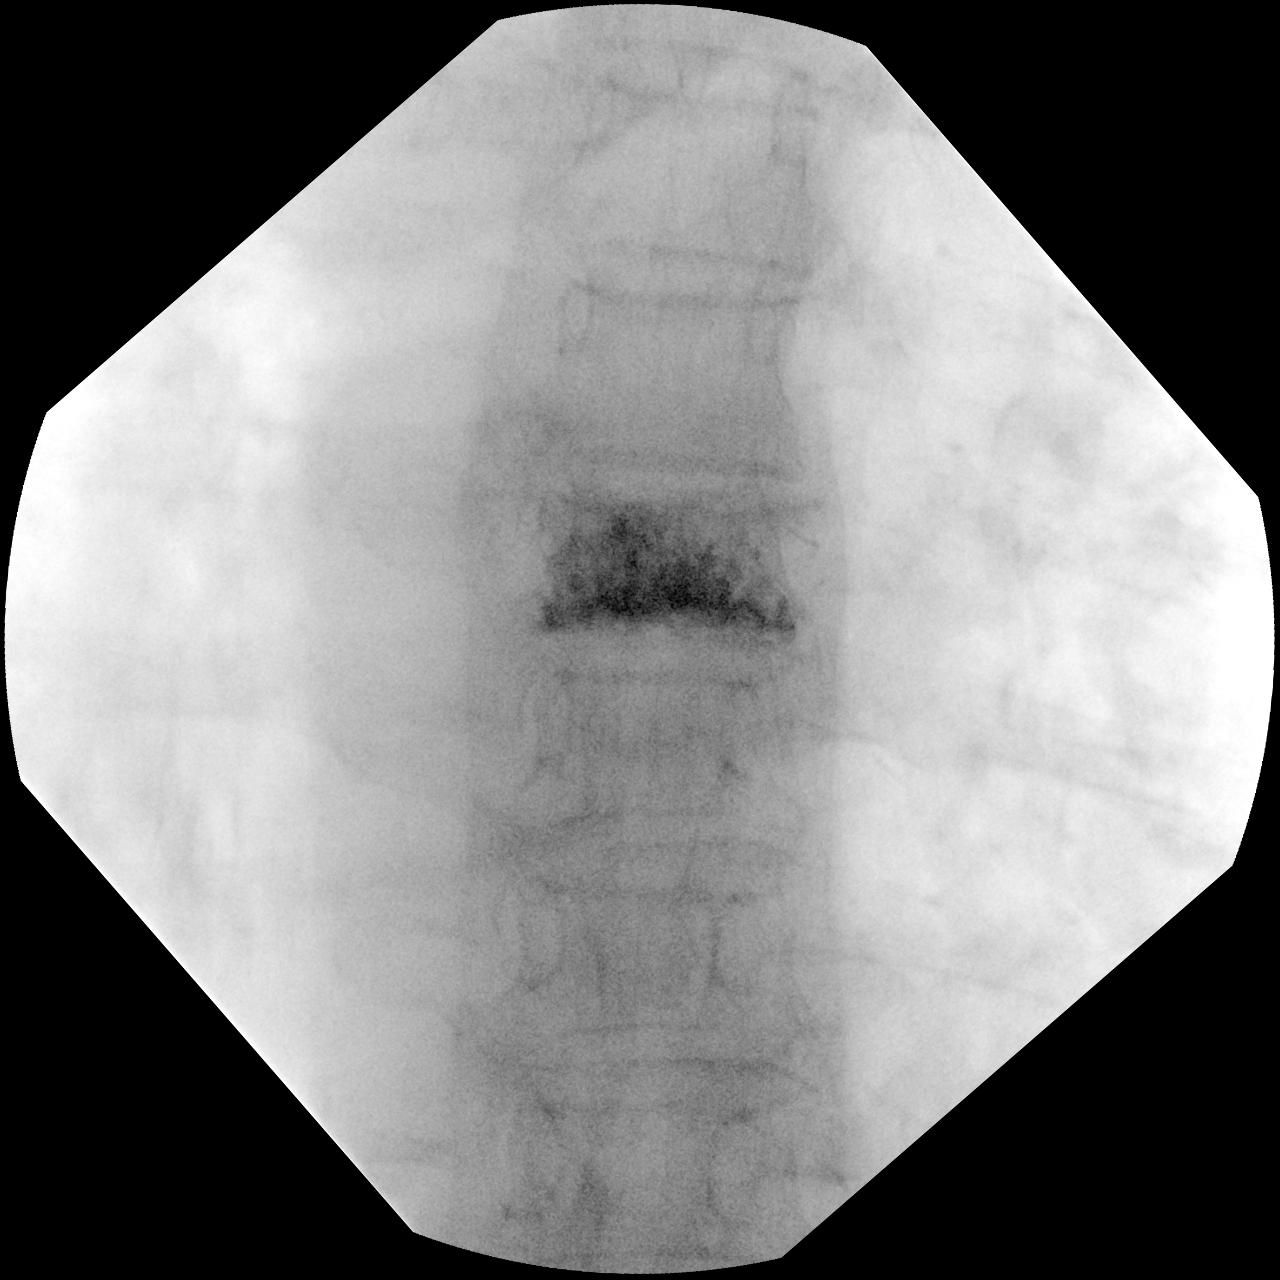
[im 2/2]
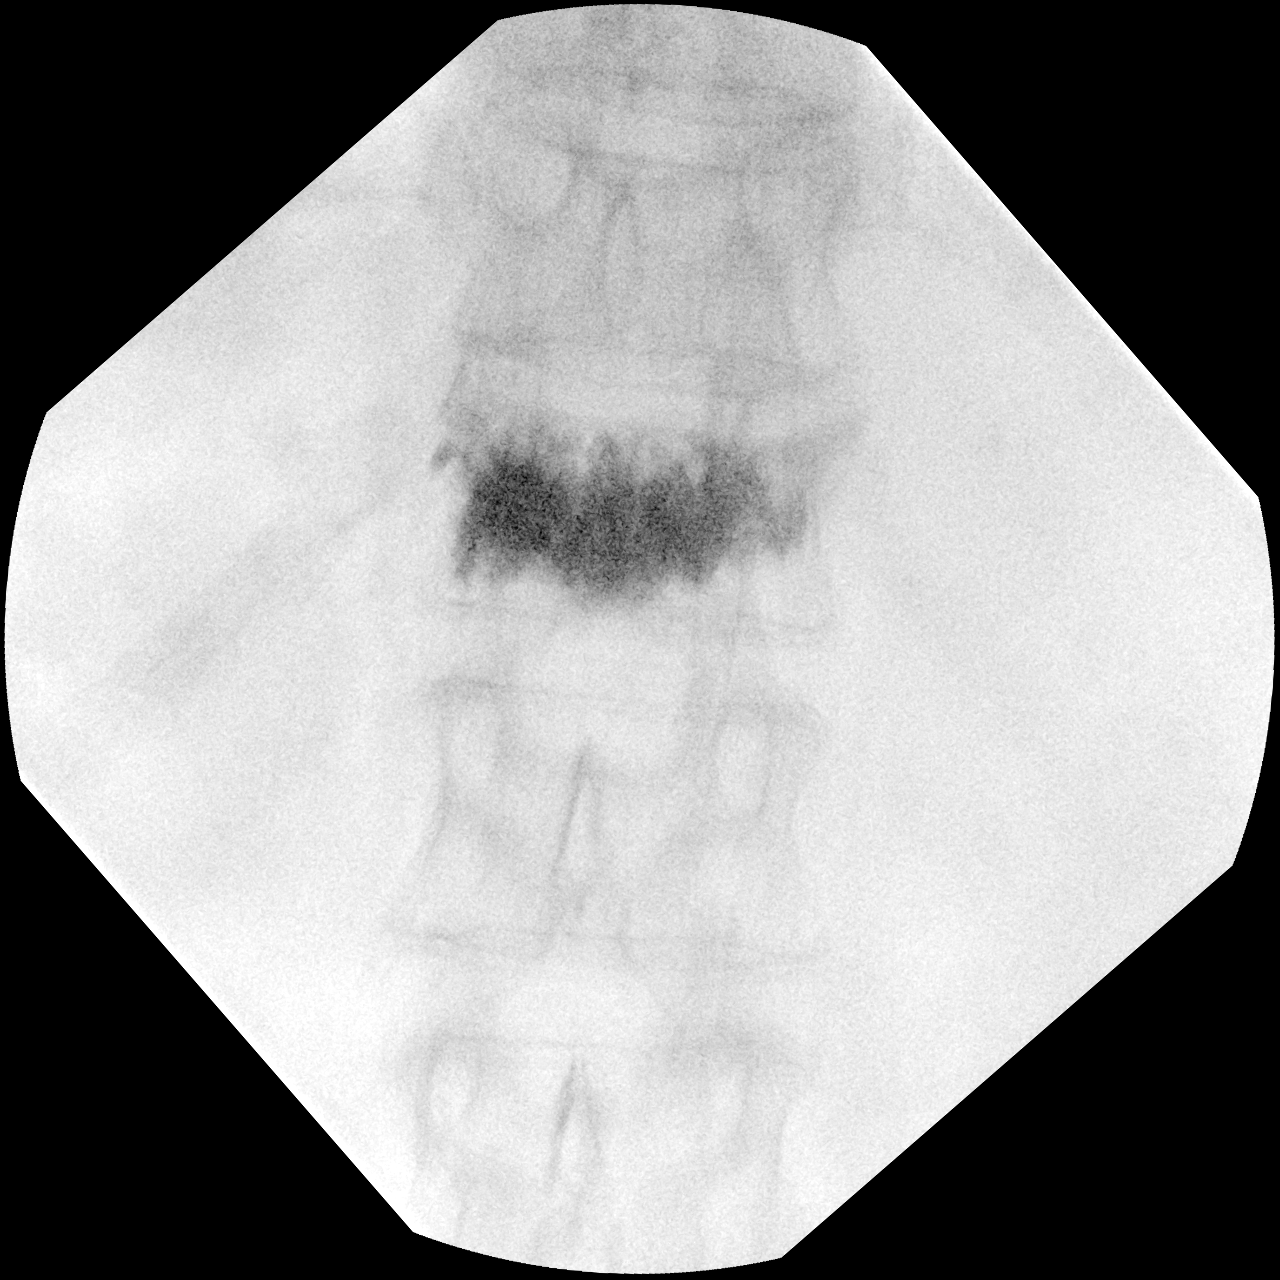

[2 of 2 positions shown; findings below may reference images not displayed]

FLUOROSCOPY TIME:  Radiation Exposure Index (as provided by the
fluoroscopic device): 23.28 mGy

If the device does not provide the exposure index:

Fluoroscopy Time:  2 minutes 38 seconds

Number of Acquired Images:  4
FINDINGS: Contrast laden cement is noted throughout the T6 and T12 vertebral
bodies consistent with the recent kyphoplasty.
IMPRESSION: T6 and T12 kyphoplasty as described.

## 2020-09-15 IMAGING — RF DG C-ARM 1-60 MIN
2 series · 4 of 4 positions shown · non-contrast
Comparison: [DATE]

CLINICAL DATA: T6 and T12 compression deformities

EXAM:
THORACIC SPINE 2 VIEWS; DG C-ARM 1-60 MIN

[Series 1: run · 2 of 2 slices shown]
[im 1/2]
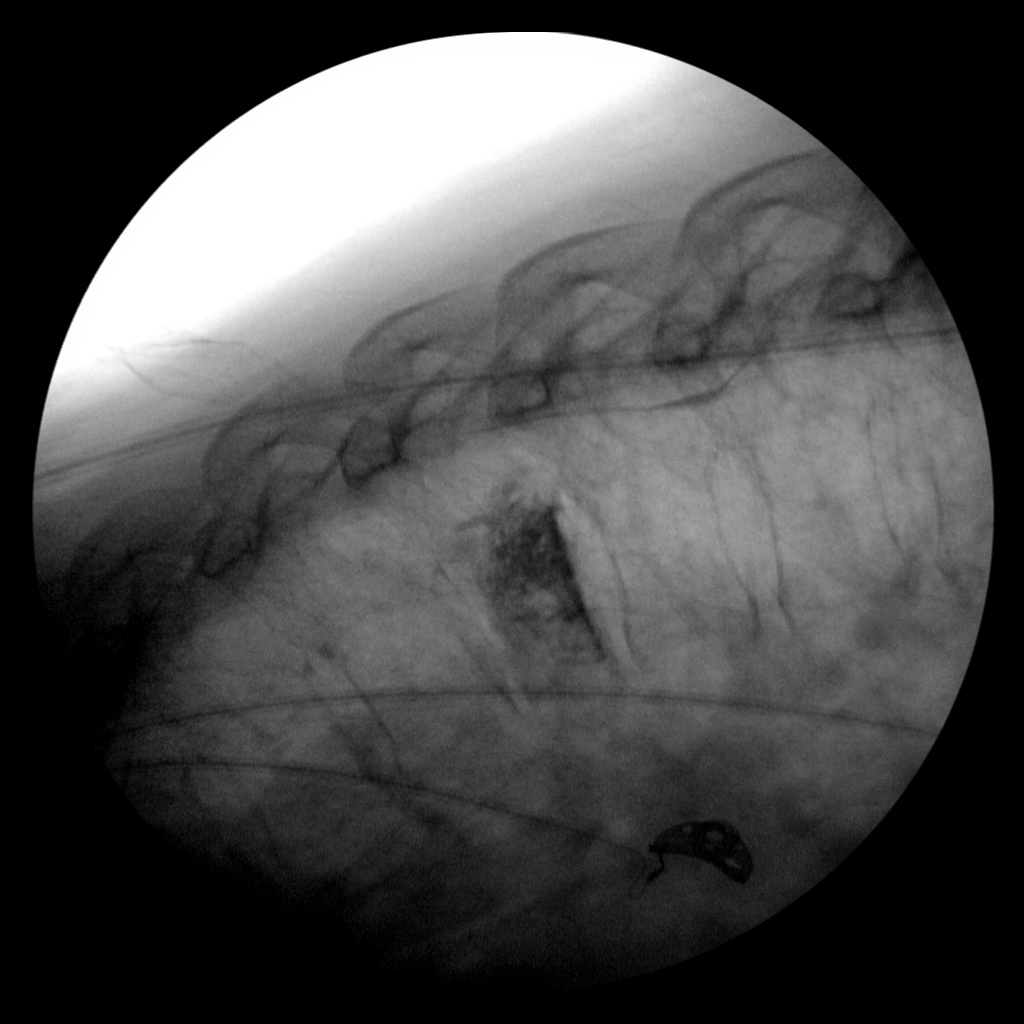
[im 2/2]
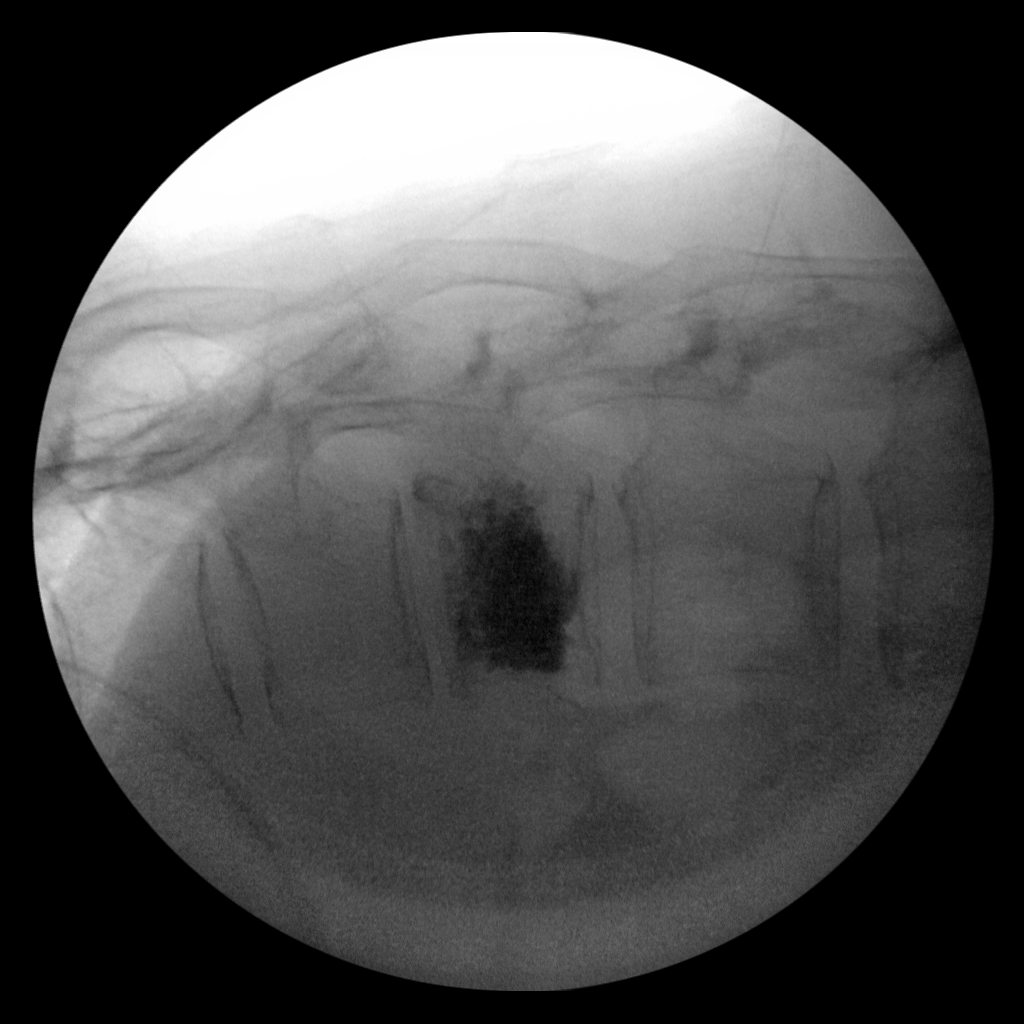

[Series 1: dg x-ray · 0.14mm/px · 2 of 2 slices shown]
[im 1/2]
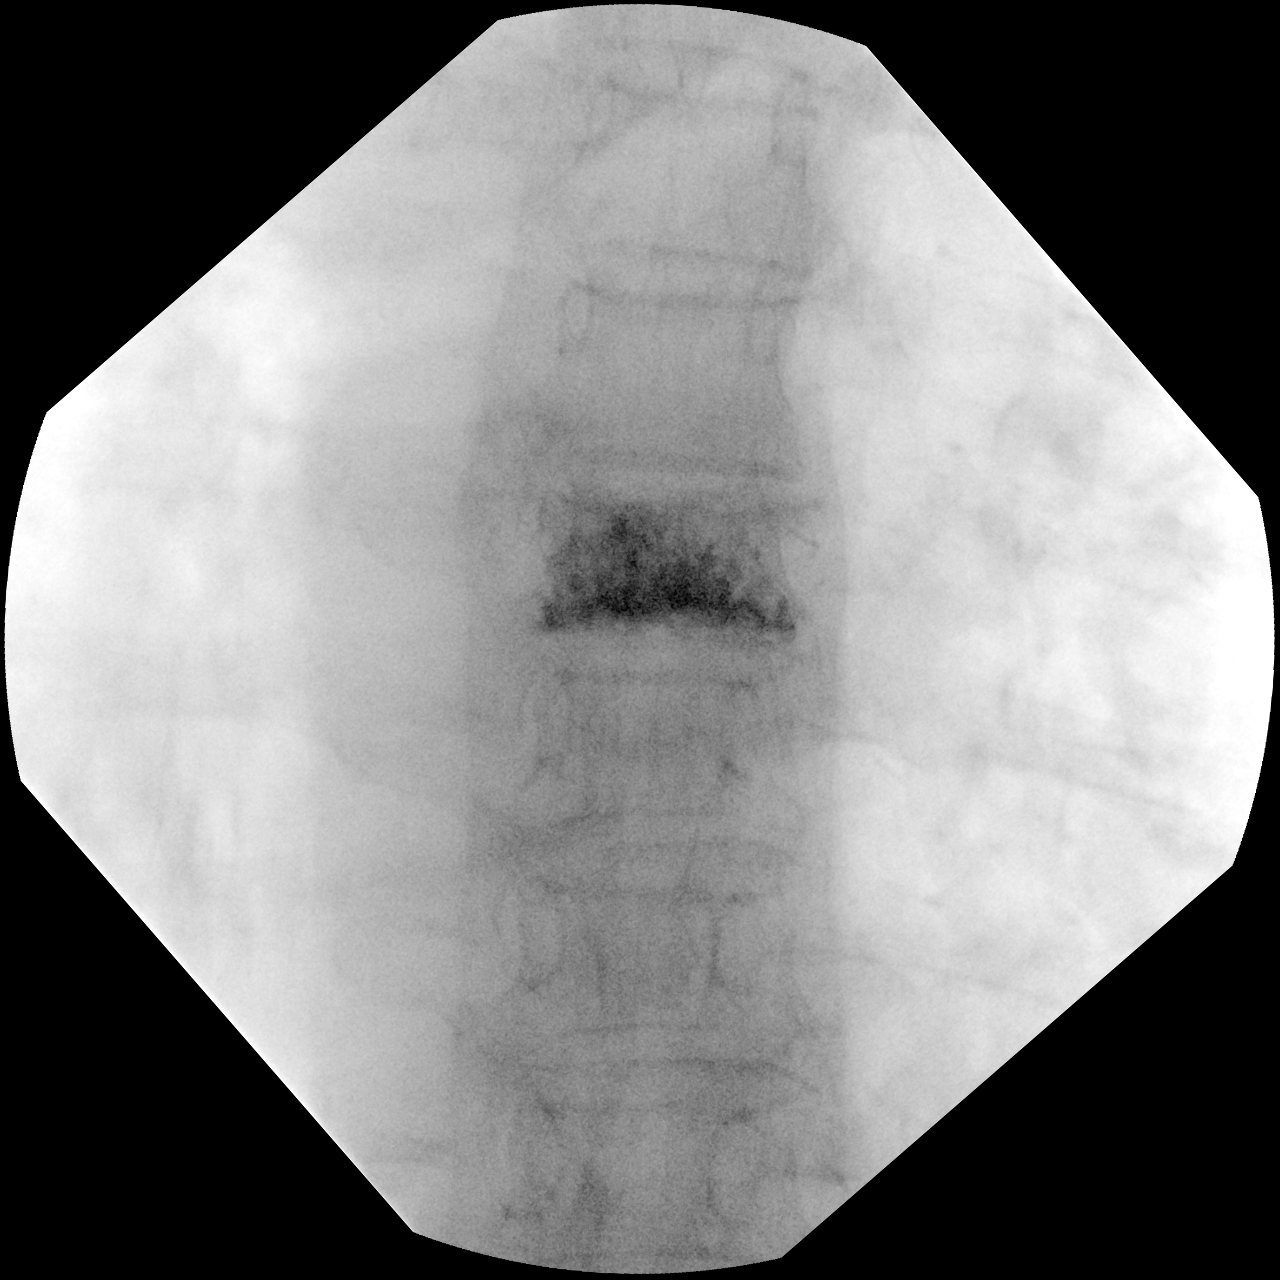
[im 2/2]
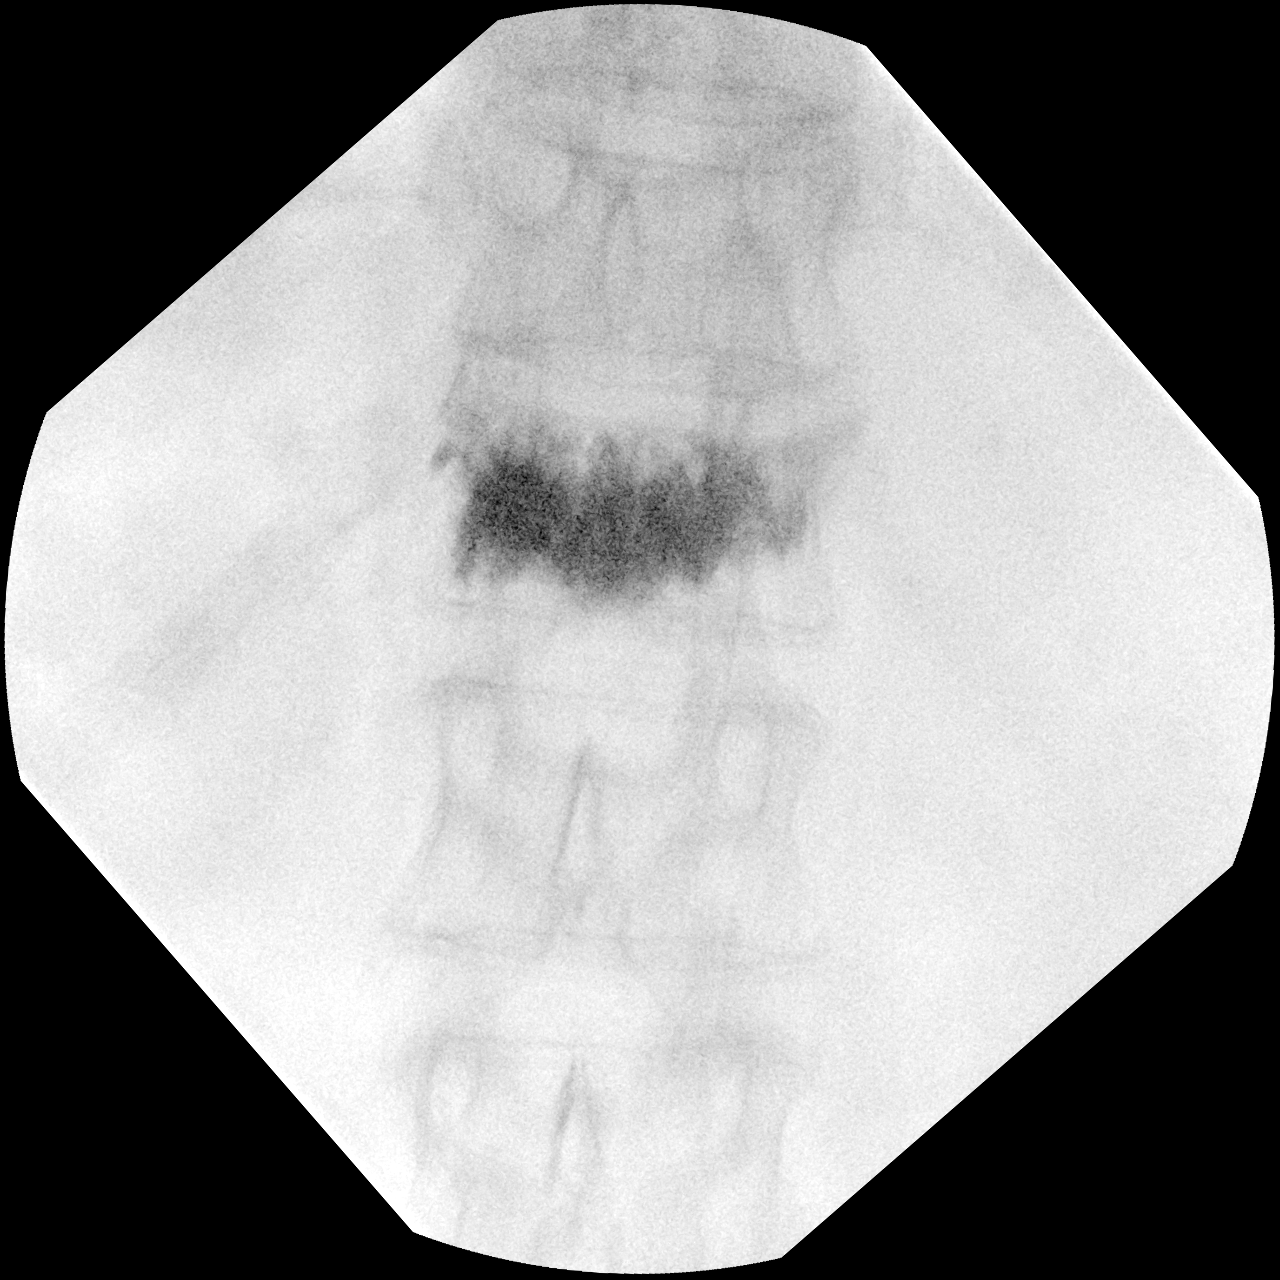

[4 of 4 positions shown; findings below may reference images not displayed]

FLUOROSCOPY TIME:  Radiation Exposure Index (as provided by the
fluoroscopic device): 23.28 mGy

If the device does not provide the exposure index:

Fluoroscopy Time:  2 minutes 38 seconds

Number of Acquired Images:  4
FINDINGS: Contrast laden cement is noted throughout the T6 and T12 vertebral
bodies consistent with the recent kyphoplasty.
IMPRESSION: T6 and T12 kyphoplasty as described.

## 2020-09-15 SURGERY — KYPHOPLASTY
Anesthesia: General

## 2020-09-15 MED ORDER — FENTANYL CITRATE (PF) 100 MCG/2ML IJ SOLN
25.0000 ug | INTRAMUSCULAR | Status: DC | PRN
  Administered 2020-09-15 (×2): 25 ug via INTRAVENOUS

## 2020-09-15 MED ORDER — PROPOFOL 10 MG/ML IV BOLUS
INTRAVENOUS | Status: AC
  Filled 2020-09-15: qty 20

## 2020-09-15 MED ORDER — MIDAZOLAM HCL 2 MG/2ML IJ SOLN
INTRAMUSCULAR | Status: AC
  Filled 2020-09-15: qty 2

## 2020-09-15 MED ORDER — FAMOTIDINE 20 MG PO TABS
ORAL_TABLET | ORAL | Status: AC
  Administered 2020-09-15: 20 mg via ORAL
  Filled 2020-09-15: qty 1

## 2020-09-15 MED ORDER — ONDANSETRON HCL 4 MG/2ML IJ SOLN
INTRAMUSCULAR | Status: DC | PRN
  Administered 2020-09-15: 4 mg via INTRAVENOUS

## 2020-09-15 MED ORDER — ORAL CARE MOUTH RINSE: 15.0000 mL | Freq: Once | OROMUCOSAL | Status: DC

## 2020-09-15 MED ORDER — LIDOCAINE HCL (PF) 1 % IJ SOLN
INTRAMUSCULAR | Status: AC
  Filled 2020-09-15: qty 30

## 2020-09-15 MED ORDER — GLYCOPYRROLATE 0.2 MG/ML IJ SOLN
INTRAMUSCULAR | Status: DC | PRN
  Administered 2020-09-15: .1 mg via INTRAVENOUS

## 2020-09-15 MED ORDER — PROPOFOL 500 MG/50ML IV EMUL
INTRAVENOUS | Status: DC | PRN
  Administered 2020-09-15: 115 ug/kg/min via INTRAVENOUS

## 2020-09-15 MED ORDER — HYDROCODONE-ACETAMINOPHEN 5-325 MG PO TABS: 1.0000 | ORAL_TABLET | Freq: Four times a day (QID) | ORAL | 0 refills | Status: DC | PRN

## 2020-09-15 MED ORDER — OXYCODONE HCL 5 MG/5ML PO SOLN: 5.0000 mg | Freq: Once | ORAL | Status: AC | PRN

## 2020-09-15 MED ORDER — CHLORHEXIDINE GLUCONATE 0.12 % MT SOLN
OROMUCOSAL | Status: AC
  Filled 2020-09-15: qty 15

## 2020-09-15 MED ORDER — PROPOFOL 10 MG/ML IV BOLUS
INTRAVENOUS | Status: DC | PRN
  Administered 2020-09-15 (×4): 20 mg via INTRAVENOUS

## 2020-09-15 MED ORDER — DEXAMETHASONE SODIUM PHOSPHATE 10 MG/ML IJ SOLN
INTRAMUSCULAR | Status: DC | PRN
  Administered 2020-09-15: 10 mg via INTRAVENOUS

## 2020-09-15 MED ORDER — CEFAZOLIN SODIUM-DEXTROSE 2-4 GM/100ML-% IV SOLN
2.0000 g | INTRAVENOUS | Status: AC
  Administered 2020-09-15: 2 g via INTRAVENOUS

## 2020-09-15 MED ORDER — BUPIVACAINE-EPINEPHRINE (PF) 0.5% -1:200000 IJ SOLN
INTRAMUSCULAR | Status: AC
  Filled 2020-09-15: qty 30

## 2020-09-15 MED ORDER — FENTANYL CITRATE (PF) 100 MCG/2ML IJ SOLN
INTRAMUSCULAR | Status: AC
  Filled 2020-09-15: qty 2

## 2020-09-15 MED ORDER — CEFAZOLIN SODIUM-DEXTROSE 2-4 GM/100ML-% IV SOLN
INTRAVENOUS | Status: AC
  Filled 2020-09-15: qty 100

## 2020-09-15 MED ORDER — CHLORHEXIDINE GLUCONATE 0.12 % MT SOLN: 15.0000 mL | Freq: Once | OROMUCOSAL | Status: DC

## 2020-09-15 MED ORDER — ACETAMINOPHEN 10 MG/ML IV SOLN
INTRAVENOUS | Status: AC
  Filled 2020-09-15: qty 100

## 2020-09-15 MED ORDER — FAMOTIDINE 20 MG PO TABS: 20.0000 mg | ORAL_TABLET | Freq: Once | ORAL | Status: AC

## 2020-09-15 MED ORDER — OXYCODONE HCL 5 MG PO TABS
5.0000 mg | ORAL_TABLET | Freq: Once | ORAL | Status: AC | PRN
  Administered 2020-09-15: 5 mg via ORAL

## 2020-09-15 MED ORDER — MIDAZOLAM HCL 2 MG/2ML IJ SOLN
INTRAMUSCULAR | Status: DC | PRN
  Administered 2020-09-15: 1 mg via INTRAVENOUS

## 2020-09-15 MED ORDER — ACETAMINOPHEN 10 MG/ML IV SOLN
INTRAVENOUS | Status: DC | PRN
  Administered 2020-09-15: 1000 mg via INTRAVENOUS

## 2020-09-15 MED ORDER — LACTATED RINGERS IV SOLN: INTRAVENOUS | Status: DC

## 2020-09-15 MED ORDER — LIDOCAINE HCL (CARDIAC) PF 100 MG/5ML IV SOSY
PREFILLED_SYRINGE | INTRAVENOUS | Status: DC | PRN
  Administered 2020-09-15: 50 mg via INTRAVENOUS

## 2020-09-15 MED ORDER — OXYCODONE HCL 5 MG PO TABS
ORAL_TABLET | ORAL | Status: AC
  Filled 2020-09-15: qty 1

## 2020-09-15 SURGICAL SUPPLY — 23 items
ADH SKN CLS APL DERMABOND .7 (GAUZE/BANDAGES/DRESSINGS) ×1
CEMENT KYPHON CX01A KIT/MIXER (Cement) ×3 IMPLANT
COVER WAND RF STERILE (DRAPES) ×3 IMPLANT
DERMABOND ADVANCED (GAUZE/BANDAGES/DRESSINGS) ×2
DERMABOND ADVANCED .7 DNX12 (GAUZE/BANDAGES/DRESSINGS) ×1 IMPLANT
DEVICE BIOPSY BONE KYPH (INSTRUMENTS) ×3 IMPLANT
DEVICE BIOPSY BONE KYPHX (INSTRUMENTS) ×3 IMPLANT
DRAPE C-ARM XRAY 36X54 (DRAPES) ×3 IMPLANT
DURAPREP 26ML APPLICATOR (WOUND CARE) ×3 IMPLANT
GLOVE SURG SYN 9.0  PF PI (GLOVE) ×2
GLOVE SURG SYN 9.0 PF PI (GLOVE) ×1 IMPLANT
GOWN SRG 2XL LVL 4 RGLN SLV (GOWNS) ×1 IMPLANT
GOWN STRL NON-REIN 2XL LVL4 (GOWNS) ×3
GOWN STRL REUS W/ TWL LRG LVL3 (GOWN DISPOSABLE) ×1 IMPLANT
GOWN STRL REUS W/TWL LRG LVL3 (GOWN DISPOSABLE) ×3
MANIFOLD NEPTUNE II (INSTRUMENTS) ×3 IMPLANT
PACK KYPHOPLASTY (MISCELLANEOUS) ×3 IMPLANT
RENTAL RFA  GENERATOR (MISCELLANEOUS)
RENTAL RFA GENERATOR (MISCELLANEOUS) IMPLANT
STRAP SAFETY 5IN WIDE (MISCELLANEOUS) ×3 IMPLANT
SWABSTK COMLB BENZOIN TINCTURE (MISCELLANEOUS) ×3 IMPLANT
TRAY KYPHOPAK 15/3 EXPRESS 1ST (MISCELLANEOUS) ×3 IMPLANT
TRAY KYPHOPAK 20/3 EXPRESS 1ST (MISCELLANEOUS) IMPLANT

## 2020-09-15 NOTE — Discharge Instructions (Addendum)
Take it easy today and tomorrow then try to walk is much as you can starting Thursday Band-Aids come off on Thursday then okay to shower Pain medicine as directed Continue all your normal Medications Call office if having problems  AMBULATORY SURGERY  DISCHARGE INSTRUCTIONS   The drugs that you were given will stay in your system until tomorrow so for the next 24 hours you should not:  Drive an automobile Make any legal decisions Drink any alcoholic beverage   You may resume regular meals tomorrow.  Today it is better to start with liquids and gradually work up to solid foods.  You may eat anything you prefer, but it is better to start with liquids, then soup and crackers, and gradually work up to solid foods.   Please notify your doctor immediately if you have any unusual bleeding, trouble breathing, redness and pain at the surgery site, drainage, fever, or pain not relieved by medication.    Additional Instructions:     Please contact your physician with any problems or Same Day Surgery at [phone], Monday through Friday 6 am to 4 pm, or [place] at [facility] number at [phone].

## 2020-09-15 NOTE — Anesthesia Preprocedure Evaluation (Signed)
Anesthesia Evaluation  Patient identified by MRN, date of birth, ID band Patient awake    Reviewed: Allergy & Precautions, NPO status , Patient's Chart, lab work & pertinent test results  History of Anesthesia Complications Negative for: history of anesthetic complications  Airway Mallampati: III  TM Distance: <3 FB Neck ROM: limited    Dental  (+) Chipped, Poor Dentition, Missing   Pulmonary neg shortness of breath, COPD, Current Smoker and Patient abstained from smoking.,    Pulmonary exam normal        Cardiovascular Exercise Tolerance: Good hypertension, (-) angina(-) Past MI Normal cardiovascular exam     Neuro/Psych PSYCHIATRIC DISORDERS negative neurological ROS  negative psych ROS   GI/Hepatic negative GI ROS, Neg liver ROS, neg GERD  ,  Endo/Other  diabetes  Renal/GU negative Renal ROS  negative genitourinary   Musculoskeletal   Abdominal   Peds  Hematology negative hematology ROS (+)   Anesthesia Other Findings Past Medical History: No date: Atrophic vaginitis No date: History of kidney stones No date: IBS (irritable bowel syndrome) No date: Lower back pain No date: Nocturia No date: OAB (overactive bladder) No date: Pre-diabetes No date: Skin cancer     Comment:  BCC No date: Squamous carcinoma No date: Suprapubic pressure No date: Urethral stricture No date: Urinary frequency No date: Urinary incontinence, nocturnal enuresis No date: Vaginal atrophy No date: Vulvar lesion  Past Surgical History: No date: COLONOSCOPY No date: SKIN CANCER EXCISION  BMI    Body Mass Index: 25.51 kg/m      Reproductive/Obstetrics negative OB ROS                             Anesthesia Physical Anesthesia Plan  ASA: 3  Anesthesia Plan: General   Post-op Pain Management:    Induction: Intravenous  PONV Risk Score and Plan: Propofol infusion and TIVA  Airway Management  Planned: Natural Airway and Nasal Cannula  Additional Equipment:   Intra-op Plan:   Post-operative Plan:   Informed Consent: I have reviewed the patients History and Physical, chart, labs and discussed the procedure including the risks, benefits and alternatives for the proposed anesthesia with the patient or authorized representative who has indicated his/her understanding and acceptance.     Dental Advisory Given  Plan Discussed with: Anesthesiologist, CRNA and Surgeon  Anesthesia Plan Comments: (Patient consented for risks of anesthesia including but not limited to:  - adverse reactions to medications - risk of airway placement if required - damage to eyes, teeth, lips or other oral mucosa - nerve damage due to positioning  - sore throat or hoarseness - Damage to heart, brain, nerves, lungs, other parts of body or loss of life  Patient voiced understanding.)        Anesthesia Quick Evaluation

## 2020-09-15 NOTE — Op Note (Signed)
09/15/2020  4:30 PM  PATIENT:  Olivia Alvarado   MRN: [id number]   PRE-OPERATIVE DIAGNOSIS:  closed wedge compression fracture of T6 and T12   POST-OPERATIVE DIAGNOSIS:  closed wedge compression fracture of T6 and T12   PROCEDURE:  Procedure(s): KYPHOPLASTY T6 and T12  SURGEON: Laurene Footman, MD   ASSISTANTS: None   ANESTHESIA:   local and MAC   EBL:  No intake/output data recorded.   BLOOD ADMINISTERED:none   DRAINS: none    LOCAL MEDICATIONS USED:  MARCAINE    and XYLOCAINE    SPECIMEN: T6 and T12 vertebral body biopsies   DISPOSITION OF SPECIMEN: Pathology   COUNTS:  YES   TOURNIQUET:  * No tourniquets in log *   IMPLANTS: Bone cement   DICTATION: .Dragon Dictation  patient was brought to the operating room and after adequate anesthesia was obtained the patient was placed prone.  C arm was brought in in good visualization of the affected level obtained on both AP and lateral projections.  After patient identification and timeout procedures were completed, local anesthetic was infiltrated with 10 cc 1% Xylocaine infiltrated subcutaneously.  This is done the area on the each side of the planned approach.  The back was then prepped and draped in the usual sterile manner and repeat timeout procedure carried out.  A spinal needle was brought down to the pedicle on the each side of T6 and at T12 and a 50-50 mix of 1% Xylocaine half percent Sensorcaine with epinephrine total of 20 cc injected on each side.  After allowing this to set a small incision was made and the trocar was advanced into the vertebral body in an extrapedicular fashion.  Biopsy was obtained at each level drilling was carried out balloon inserted with inflation to 1.5 cc on the right at T6 and this across the midline so second stick was not required, 2 cc on the right at T12 and and 1.5 cc on the left.  When the cement was appropriate consistency 2-1/2 cc were injected on the right at T6 and 2-1/2 cc on each  side at T12 into the vertebral body without extravasation, good fill superior to inferior endplates and from right to left sides along the inferior endplate.  After the cement had set the trochar was removed and permanent C-arm views obtained.  The wound s were closed with Dermabond followed by Band-Aid   PLAN OF CARE: Discharge to home after recovery room   PATIENT DISPOSITION:  PACU - hemodynamically stable.

## 2020-09-15 NOTE — H&P (Signed)
Olivia Alvarado, [place] - 09/02/2020 3:00 PM EDT Formatting of this note is different from the original. Images from the original note were not included. Chief Complaint: Chief Complaint  Patient presents with  compression fracture  T3,T6,T12   Olivia Alvarado is a 70 y.o. female who presents today for evaluation of T3's, T6, T12 compression fracture. Patient was seen in the ER on 08/20/2020 where CT of the thoracic and lumbar spine showed T3, T6 and T12 compression fractures, age undetermined. Patient had fallen several weeks prior and developed this back pain. Back pain is mostly along the mid to lower thoracic spine with no lumbar spine back pain. No numbness or tingling or lower extremity radicular symptoms. She does complain of some radiating pain along the ribs and underneath both breasts that is worse with taking a deep breath. She is ambulatory. She has been wearing a TLSO brace. She has been taking some over-the-counter medications as well as occasional narcotic pain medication as needed. Patient's pain has not improved over the last few weeks. Pain is moderate to severe. She has a hard time with bending and getting in and out of bed. She has a hard time sitting up against a hard object. No known compression fractures.  Past Medical History: Past Medical History:  Diagnosis Date  Colon cancer (CMS-HCC)  IBS (irritable bowel syndrome)  Pelvic pain in female 10/03/2016  Postprandial abdominal bloating 10/03/2016  Skin cancer   Past Surgical History: Past Surgical History:  Procedure Laterality Date  Basal and squamoous skin cancers removed  COLONOSCOPY 02/14/2013  Diverticulosis  COLONOSCOPY 11/30/2017  FHCP (Father/Brother) CBF 11/2022   Past Family History: Family History  Problem Relation Age of Onset  Kidney cancer Mother  Rectal cancer Mother  Colon polyps Mother  Irritable bowel syndrome Daughter  Colon cancer Maternal Uncle  Colon cancer Paternal Aunt  Colon  cancer Maternal Grandmother  Breast cancer Maternal Grandmother  Colon cancer Paternal Aunt  Colon polyps Brother  Colon cancer Paternal Grandmother   Medications: Current Outpatient Medications Ordered in Epic  Medication Sig Dispense Refill  ascorbic acid, vitamin C, (VITAMIN C) 1000 MG tablet Take 1,000 mg by mouth once daily  atorvastatin (LIPITOR) 10 MG tablet TAKE 1 TABLET(10 MG) BY MOUTH EVERY DAY 90 tablet 3  cholecalciferol (VITAMIN D3) 1000 unit tablet Take by mouth  docusate sodium (STOOL SOFTENER ORAL) Take by mouth  FLUoxetine (PROZAC) 10 MG capsule TAKE 1 CAPSULE(10 MG) BY MOUTH EVERY DAY 90 capsule 3  Lactobacillus acidophilus (PROBIOTIC ORAL) Take by mouth (Patient not taking: Reported on 08/17/2020)  loratadine (CLARITIN) 10 mg tablet Take 10 mg by mouth continuously as needed  TORsemide (DEMADEX) 5 MG tablet Take 1 tablet (5 mg total) by mouth once daily 30 tablet 11  vitamin E 100 UNIT capsule Take 500 Units by mouth once daily   No current Epic-ordered facility-administered medications on file.   Allergies: Allergies  Allergen Reactions  Rosuvastatin Muscle Pain    Review of Systems:  A comprehensive 14 point ROS was performed, reviewed by me today, and the pertinent orthopaedic findings are documented in the HPI.  Exam: BP (!) 140/82  Wt 63.5 kg (140 lb)  BMI 25.61 kg/m  General:  Well developed, well nourished, no apparent distress, normal affect, normal gait with no antalgic component.   HEENT: Head normocephalic, atraumatic, PERRL.   Abdomen: Soft, non tender, non distended, Bowel sounds present.  Heart: Examination of the heart reveals regular, rate, and rhythm. There is no  murmur noted on ascultation. There is a normal apical pulse.  Lungs: Lungs are clear to auscultation. There is no wheeze, rhonchi, or crackles. There is normal expansion of bilateral chest walls.   Thoracolumbar spine: Examination of thoracolumbar spine shows spinous  process tenderness along the lower thoracic spine with percussion. No upper thoracic or cervical spinous process tenderness. No lumbar spinous process tenderness. Patient ambulates no assistive devices. She is neuro vas intact in bilateral lower extremities.  CLINICAL DATA: Back pain fall 1 month ago   EXAM:  CT THORACIC SPINE WITHOUT CONTRAST   TECHNIQUE:  Multidetector CT images of the thoracic were obtained using the  standard protocol without intravenous contrast.   COMPARISON: CT 08/06/2020   FINDINGS:  Alignment: Normal.   Vertebrae: Mild age indeterminate superior endplate deformities at  T3 and T6. Mild superior endplate deformity at B15 with slight  sclerosis. Less than 10% loss of vertebral body height at this  level.   Paraspinal and other soft tissues: Slightly indistinct paravertebral  soft tissues at T12. Paravertebral and paraspinal soft tissues  otherwise unremarkable   Disc levels: Mild degenerative osteophytes at multiple levels.   IMPRESSION:  1. Mild superior endplate deformity at V76 with similar minimal loss  of height as compared with 08/06/2020 CT. Mild paravertebral edema  suggests subacute process.  2. Age indeterminate mild superior endplate deformities at T3 and  T6.   EXAM:  MRI THORACIC SPINE WITHOUT CONTRAST   TECHNIQUE:  Multiplanar, multisequence MR imaging of the thoracic spine was  performed. No intravenous contrast was administered.   COMPARISON: CT thoracic spine 08/20/2020   FINDINGS:  Alignment: Physiologic.   Vertebrae: Mild wedge compression deformities at T3, T6 and T12 are  unchanged. Marrow edema is greatest at T6, but is present at all 3  levels. Height loss less than 25% at all levels. 3 mm retropulsion  at T12. There is a hemangioma at T8.   Cord: Normal signal and morphology.   Paraspinal and other soft tissues: Negative.   Disc levels:   No spinal canal or neural foraminal stenosis.   IMPRESSION:   Acute/subacute wedge compression fractures of T3, T6 and T12,  unchanged. 3 mm retropulsion at T12 with no associated stenosis.   Electronically Signed   Impression: Closed wedge compression fracture of T3 vertebra, initial encounter (CMS-HCC) [S22.030A] Closed wedge compression fracture of T3 vertebra, initial encounter (CMS-HCC) (primary encounter diagnosis) Closed wedge compression fracture of T6 vertebra, initial encounter (CMS-HCC) Closed wedge compression fracture of T12 vertebra, initial encounter (CMS-HCC)  Plan:  30. 70 year old female with evidence of T3, T6, T12 compression fracture. Pain is mostly along the mid to lower thoracic spine. Will order MRI thoracic spine without contrast. MRI confirmed T6, T12 as well as T3 compression fractures. She is not having any pain along the T3 region. Discussed treatment options versus conservative and surgical treatment. Patient would like to proceed with T6 and T12 kyphoplasty as patient has seen no improvement with pain over the last few weeks. Patient's pain is moderate to severe. Risks, benefits, complications of a T6 and T12 kyphoplasty have been discussed with the patient. Patient has agreed and consented procedure with Dr. Hessie Knows This note was generated in part with voice recognition software and I apologize for any typographical errors that were not detected and corrected.  Olivia Alvarado MPA-C   Electronically signed by Olivia Gottron, PA at 09/08/2020 12:49 PM EDT  Reviewed  H+P. No changes noted.

## 2020-09-15 NOTE — Transfer of Care (Signed)
Immediate Anesthesia Transfer of Care Note  Patient: Olivia Alvarado  Procedure(s) Performed: T6, T12 Kyphoplasty  Patient Location: PACU  Anesthesia Type:General  Level of Consciousness: awake, drowsy and patient cooperative  Airway & Oxygen Therapy: Patient Spontanous Breathing  Post-op Assessment: Report given to RN and Post -op Vital signs reviewed and stable  Post vital signs: Reviewed and stable  Last Vitals:  Vitals Value Taken Time  BP 112/97 09/15/20 1630  Temp    Pulse 84 09/15/20 1631  Resp 20 09/15/20 1631  SpO2 98 % 09/15/20 1631  Vitals shown include unvalidated device data.  Last Pain:  Vitals:   09/15/20 1220  TempSrc: Tympanic  PainSc: 0-No pain         Complications: No notable events documented.

## 2020-09-15 NOTE — Anesthesia Procedure Notes (Signed)
Procedure Name: General with mask airway Date/Time: 09/15/2020 4:07 PM Performed by: Kelton Pillar, CRNA Pre-anesthesia Checklist: Patient identified, Emergency Drugs available, Suction available and Patient being monitored Patient Re-evaluated:Patient Re-evaluated prior to induction Oxygen Delivery Method: Simple face mask Induction Type: IV induction Placement Confirmation: positive ETCO2 and CO2 detector Dental Injury: Teeth and Oropharynx as per pre-operative assessment

## 2020-09-16 ENCOUNTER — Encounter: Payer: Self-pay | Admitting: Orthopedic Surgery

## 2020-09-16 NOTE — Anesthesia Postprocedure Evaluation (Signed)
Anesthesia Post Note  Patient: Olivia Alvarado  Procedure(s) Performed: T6, T12 Kyphoplasty  Patient location during evaluation: PACU Anesthesia Type: General Level of consciousness: awake and alert Pain management: pain level controlled Vital Signs Assessment: post-procedure vital signs reviewed and stable Respiratory status: spontaneous breathing, nonlabored ventilation, respiratory function stable and patient connected to nasal cannula oxygen Cardiovascular status: blood pressure returned to baseline and stable Postop Assessment: no apparent nausea or vomiting Anesthetic complications: no   No notable events documented.   Last Vitals:  Vitals:   09/15/20 1645 09/15/20 1710  BP: 121/67 138/61  Pulse: 69 64  Resp: 17 16  Temp:  (!) 36.2 C  SpO2: 96% 96%    Last Pain:  Vitals:   09/15/20 1710  TempSrc: Temporal  PainSc: 1                  Martha Clan

## 2020-09-18 LAB — SURGICAL PATHOLOGY

## 2020-10-01 ENCOUNTER — Other Ambulatory Visit: Payer: Self-pay | Admitting: Gastroenterology

## 2020-10-02 ENCOUNTER — Other Ambulatory Visit: Payer: Self-pay | Admitting: Gastroenterology

## 2020-10-02 DIAGNOSIS — R140 Abdominal distension (gaseous): Secondary | ICD-10-CM

## 2020-10-02 DIAGNOSIS — R109 Unspecified abdominal pain: Secondary | ICD-10-CM

## 2020-10-19 ENCOUNTER — Other Ambulatory Visit: Payer: Self-pay

## 2020-10-19 ENCOUNTER — Ambulatory Visit
Admission: RE | Admit: 2020-10-19 | Discharge: 2020-10-19 | Disposition: A | Discharge status: Home / Self Care | Payer: Medicare Other | Source: Ambulatory Visit | Attending: Gastroenterology | Admitting: Gastroenterology

## 2020-10-19 DIAGNOSIS — R140 Abdominal distension (gaseous): Secondary | ICD-10-CM | POA: Insufficient documentation

## 2020-10-19 DIAGNOSIS — R109 Unspecified abdominal pain: Secondary | ICD-10-CM | POA: Diagnosis present

## 2020-10-19 IMAGING — US US PELVIS COMPLETE WITH TRANSVAGINAL
1 series · 14 of 25 positions shown · non-contrast
Comparison: Ultrasound [DATE], CT [DATE]

CLINICAL DATA: Bloating

EXAM:
TRANSABDOMINAL AND TRANSVAGINAL ULTRASOUND OF PELVIS
TECHNIQUE: Both transabdominal and transvaginal ultrasound examinations of the
pelvis were performed. Transabdominal technique was performed for
global imaging of the pelvis including uterus, ovaries, adnexal
regions, and pelvic cul-de-sac. It was necessary to proceed with
endovaginal exam following the transabdominal exam to visualize the
uterus endometrium ovaries.

[Series 1: us pelvis complete with transvaginal · 0.21mm/px · 14 of 64 slices shown]
[im 1/64]
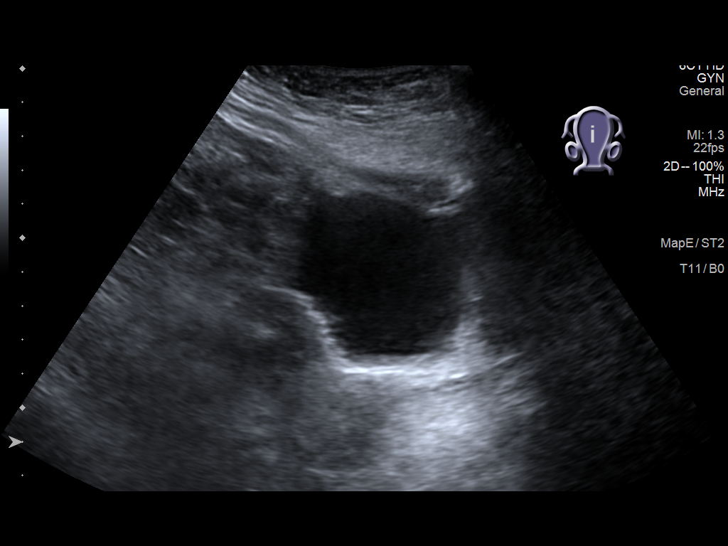
[im 6/64]
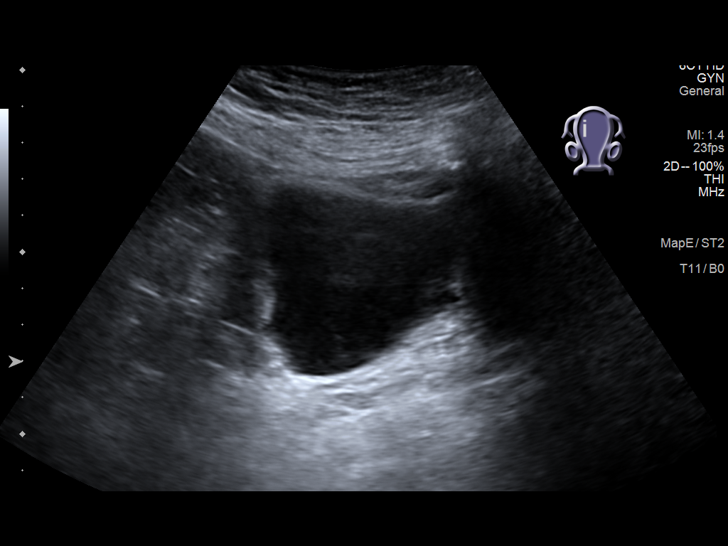
[im 11/64]
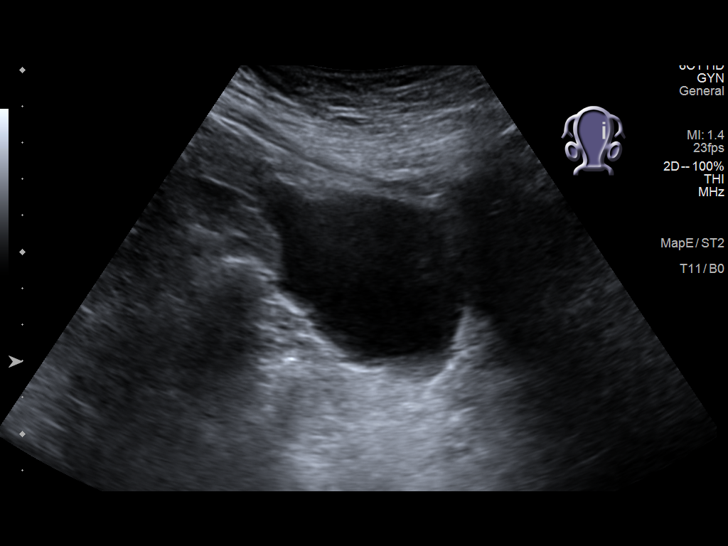
[im 16/64]
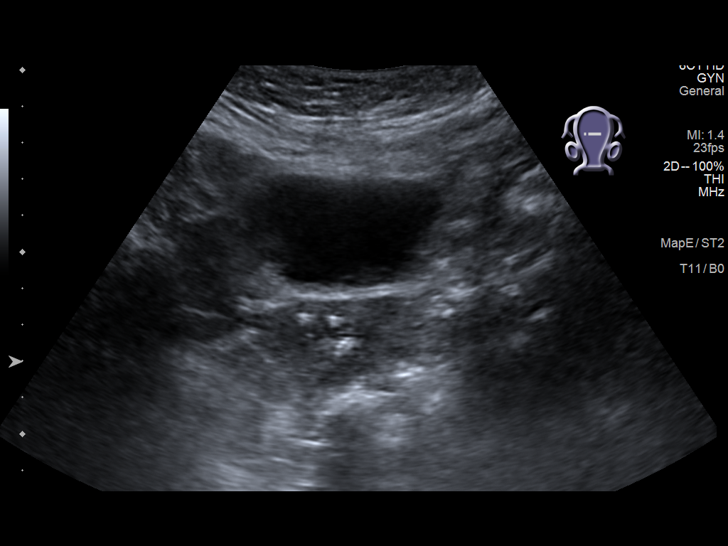
[im 22/64]
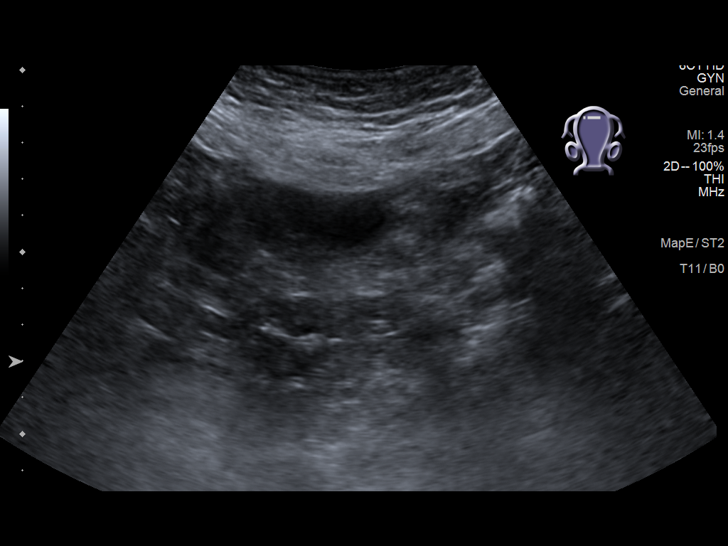
[im 24/64]
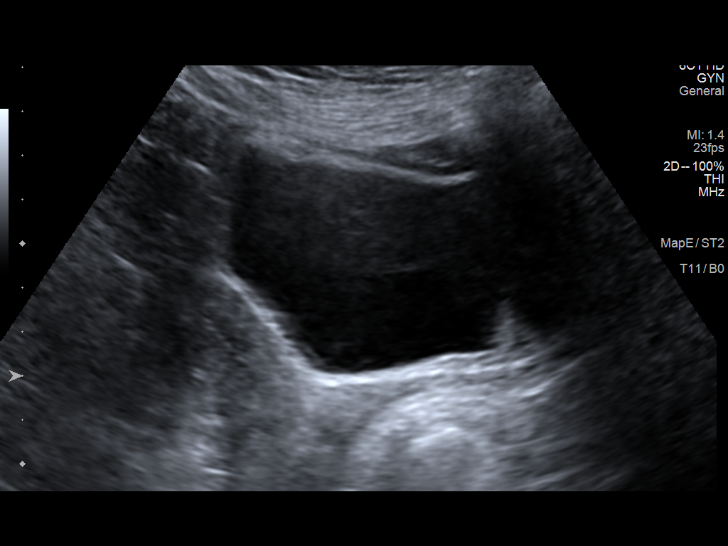
[im 29/64]
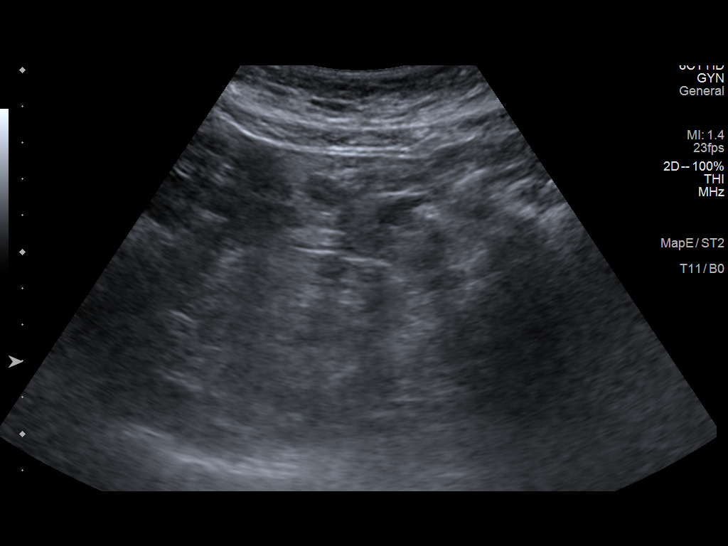
[im 35/64]
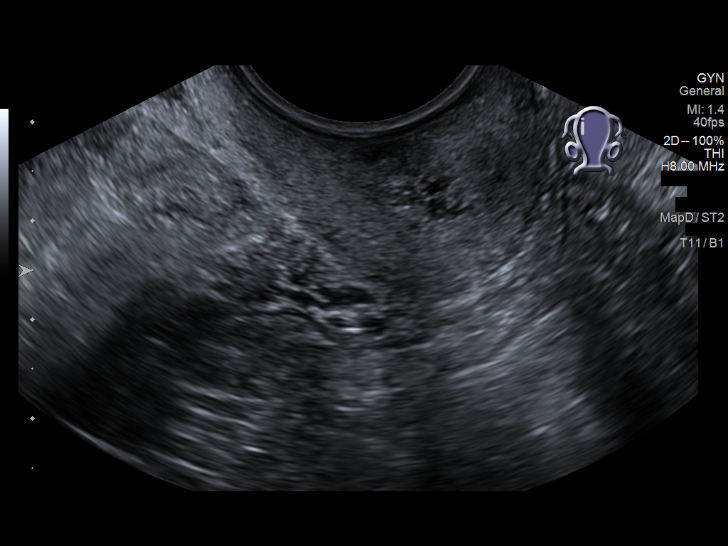
[im 40/64]
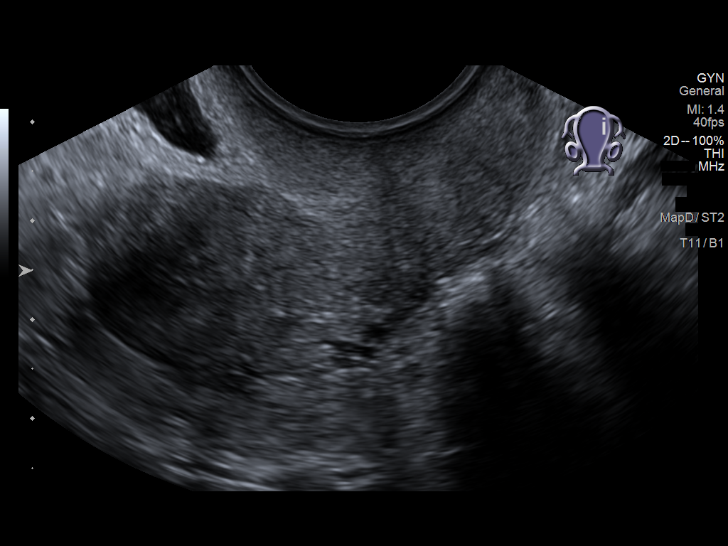
[im 43/64]
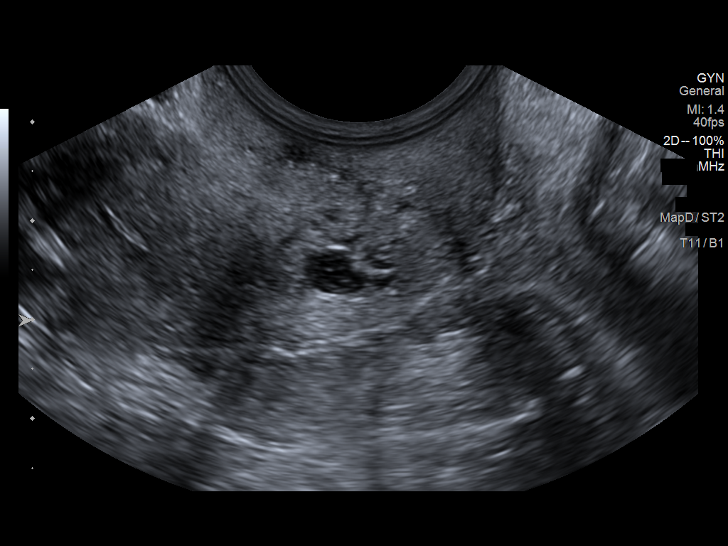
[im 48/64]
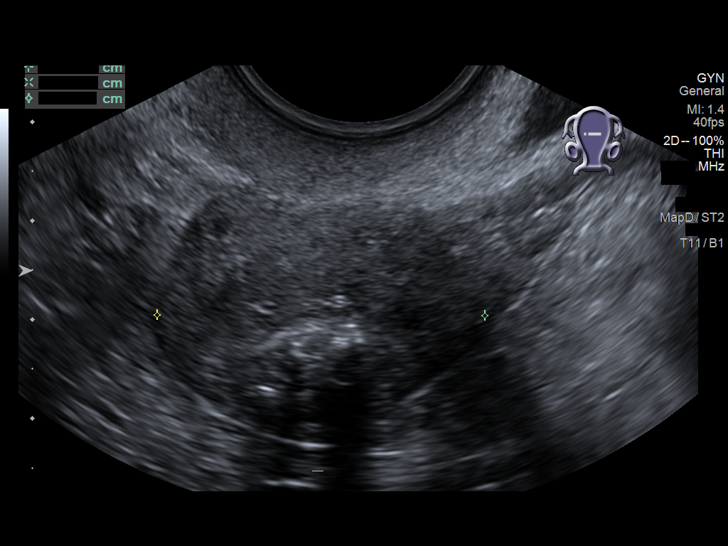
[im 53/64]
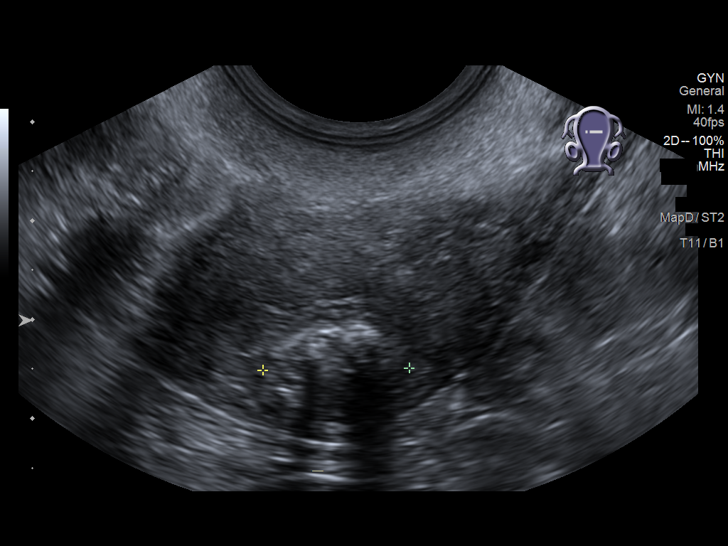
[im 58/64]
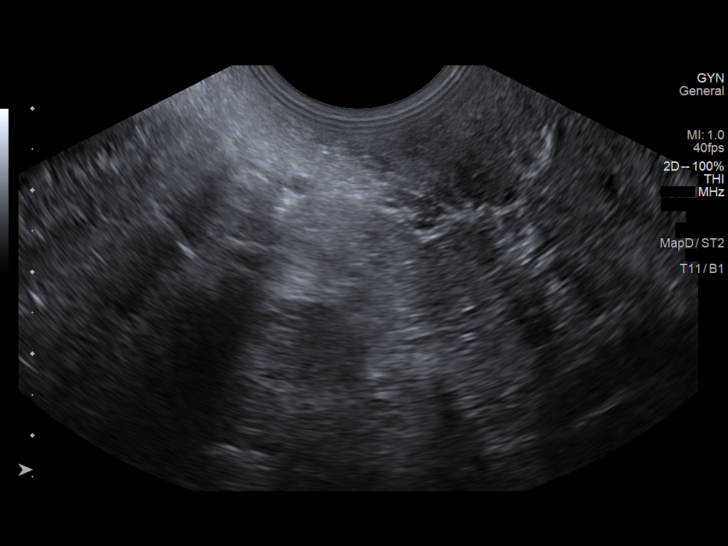
[im 64/64]
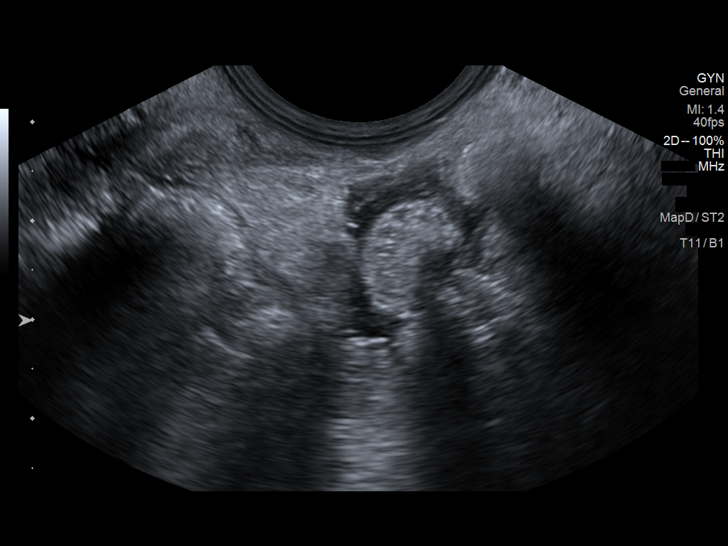

[14 of 25 positions shown; findings below may reference images not displayed]

FINDINGS: Uterus

Measurements: 5.1 x 2.4 x 3.3 cm = volume: 21.2 mL. Partially
calcified uterine fibroid within the posterior corpus measuring 14 x
9 x 15 mm.

Endometrium

Thickness: 1.5 mm.  No focal abnormality visualized.

Right ovary

Nonvisualized

Left ovary

Nonvisualized

Other findings

No abnormal free fluid.
IMPRESSION: 1. Nonvisualized ovaries.
2. Calcified uterine fibroid.

## 2020-12-28 ENCOUNTER — Other Ambulatory Visit: Payer: Self-pay | Admitting: Family Medicine

## 2020-12-28 DIAGNOSIS — Z1231 Encounter for screening mammogram for malignant neoplasm of breast: Secondary | ICD-10-CM

## 2021-01-12 ENCOUNTER — Ambulatory Visit: Payer: Medicare Other

## 2021-01-27 ENCOUNTER — Ambulatory Visit: Payer: Medicare Other

## 2021-01-31 ENCOUNTER — Emergency Department: Payer: Medicare Other

## 2021-01-31 ENCOUNTER — Emergency Department
Admission: EM | Admit: 2021-01-31 | Discharge: 2021-01-31 | Disposition: A | Discharge status: Home / Self Care | Payer: Medicare Other | Attending: Student in an Organized Health Care Education/Training Program | Admitting: Student in an Organized Health Care Education/Training Program

## 2021-01-31 ENCOUNTER — Encounter: Payer: Self-pay | Admitting: Emergency Medicine

## 2021-01-31 ENCOUNTER — Other Ambulatory Visit: Payer: Self-pay

## 2021-01-31 DIAGNOSIS — F17210 Nicotine dependence, cigarettes, uncomplicated: Secondary | ICD-10-CM | POA: Insufficient documentation

## 2021-01-31 DIAGNOSIS — M79652 Pain in left thigh: Secondary | ICD-10-CM | POA: Insufficient documentation

## 2021-01-31 DIAGNOSIS — E119 Type 2 diabetes mellitus without complications: Secondary | ICD-10-CM | POA: Insufficient documentation

## 2021-01-31 DIAGNOSIS — S8012XA Contusion of left lower leg, initial encounter: Secondary | ICD-10-CM | POA: Diagnosis not present

## 2021-01-31 DIAGNOSIS — Z85828 Personal history of other malignant neoplasm of skin: Secondary | ICD-10-CM | POA: Insufficient documentation

## 2021-01-31 DIAGNOSIS — I10 Essential (primary) hypertension: Secondary | ICD-10-CM | POA: Diagnosis not present

## 2021-01-31 DIAGNOSIS — M25552 Pain in left hip: Secondary | ICD-10-CM | POA: Insufficient documentation

## 2021-01-31 DIAGNOSIS — M549 Dorsalgia, unspecified: Secondary | ICD-10-CM | POA: Insufficient documentation

## 2021-01-31 DIAGNOSIS — S8992XA Unspecified injury of left lower leg, initial encounter: Secondary | ICD-10-CM | POA: Diagnosis present

## 2021-01-31 DIAGNOSIS — M79605 Pain in left leg: Secondary | ICD-10-CM

## 2021-01-31 DIAGNOSIS — X58XXXA Exposure to other specified factors, initial encounter: Secondary | ICD-10-CM | POA: Insufficient documentation

## 2021-01-31 LAB — BASIC METABOLIC PANEL
Anion gap: 9 (ref 5–15)
BUN: 16 mg/dL (ref 8–23)
CO2: 26 mmol/L (ref 22–32)
Calcium: 8.5 mg/dL — ABNORMAL LOW (ref 8.9–10.3)
Chloride: 104 mmol/L (ref 98–111)
Creatinine, Ser: 0.45 mg/dL (ref 0.44–1.00)
GFR, Estimated: >60 mL/min (ref >60)
Glucose, Bld: 117 mg/dL — ABNORMAL HIGH (ref 70–99)
Potassium: 3.6 mmol/L (ref 3.5–5.1)
Sodium: 139 mmol/L (ref 135–145)

## 2021-01-31 LAB — CBC
HCT: 28.1 % — ABNORMAL LOW (ref 36.0–46.0)
Hemoglobin: 9.5 g/dL — ABNORMAL LOW (ref 12.0–15.0)
MCH: 30.2 pg (ref 26.0–34.0)
MCHC: 33.8 g/dL (ref 30.0–36.0)
MCV: 89.2 fL (ref 80.0–100.0)
Platelets: 363 10*3/uL (ref 150–400)
RBC: 3.15 MIL/uL — ABNORMAL LOW (ref 3.87–5.11)
RDW: 12.9 % (ref 11.5–15.5)
WBC: 7.8 10*3/uL (ref 4.0–10.5)
nRBC: 0 % (ref 0.0–0.2)

## 2021-01-31 LAB — SAMPLE TO BLOOD BANK

## 2021-01-31 LAB — PROTIME-INR
INR: 1.1 (ref 0.8–1.2)
Prothrombin Time: 13.7 seconds (ref 11.4–15.2)

## 2021-01-31 IMAGING — US US EXTREM LOW VENOUS*L*
1 series · 14 of 24 positions shown · non-contrast
Comparison: None.

CLINICAL DATA: 70-year-old female with acute LEFT leg pain.

EXAM:
LEFT LOWER EXTREMITY VENOUS DOPPLER ULTRASOUND
TECHNIQUE: Gray-scale sonography with compression, as well as color and duplex
ultrasound, were performed to evaluate the deep venous system(s)
from the level of the common femoral vein through the popliteal and
proximal calf veins.

[Series 1: us venous img lower uni left (dvt) · portal-venous · 14 of 38 slices shown]
[im 1/38]
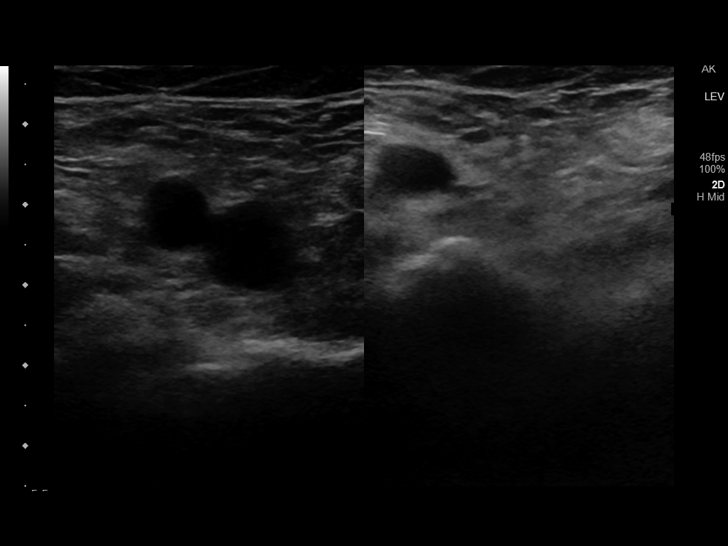
[im 4/38]
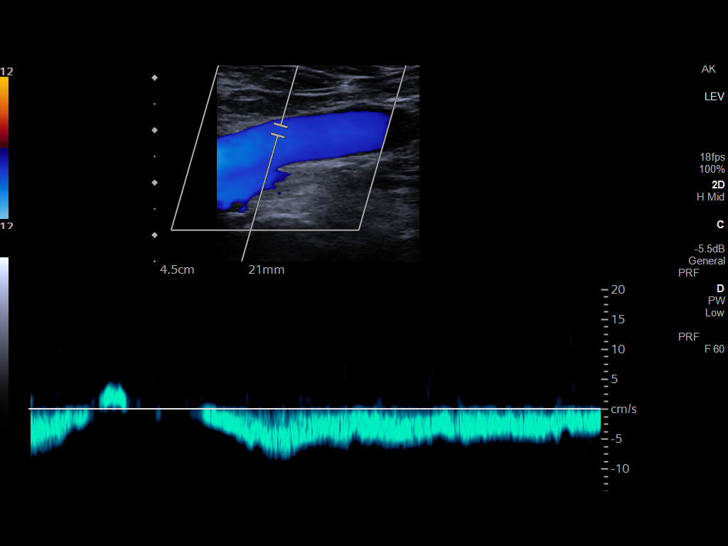
[im 7/38]
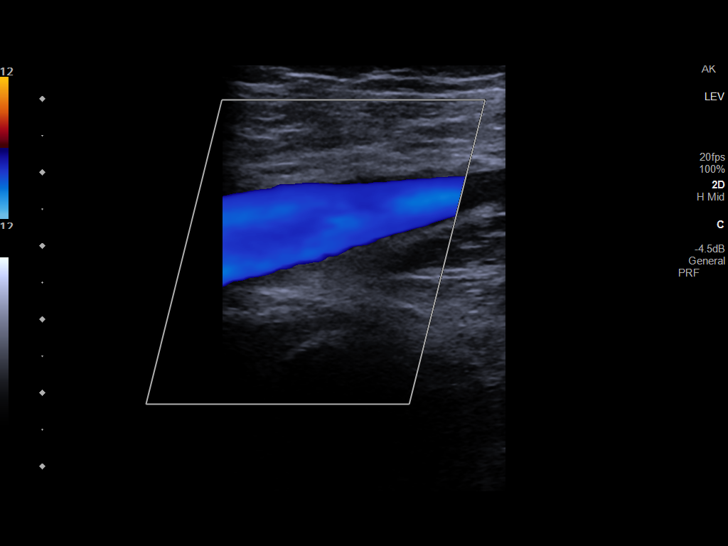
[im 10/38]
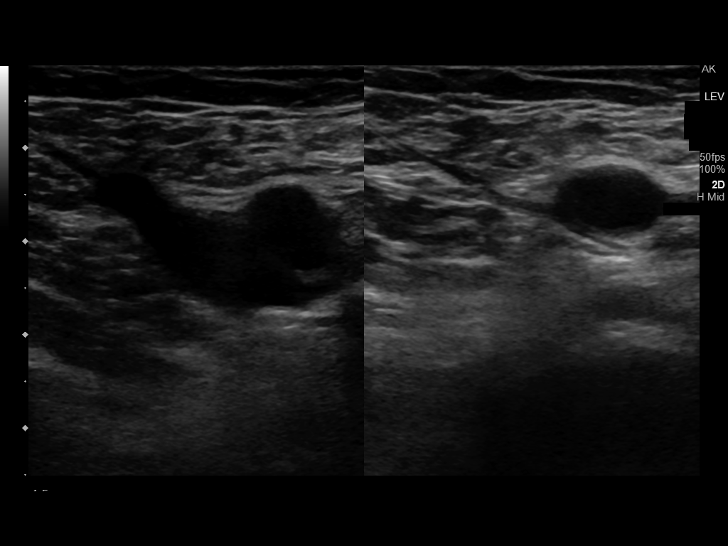
[im 12/38]
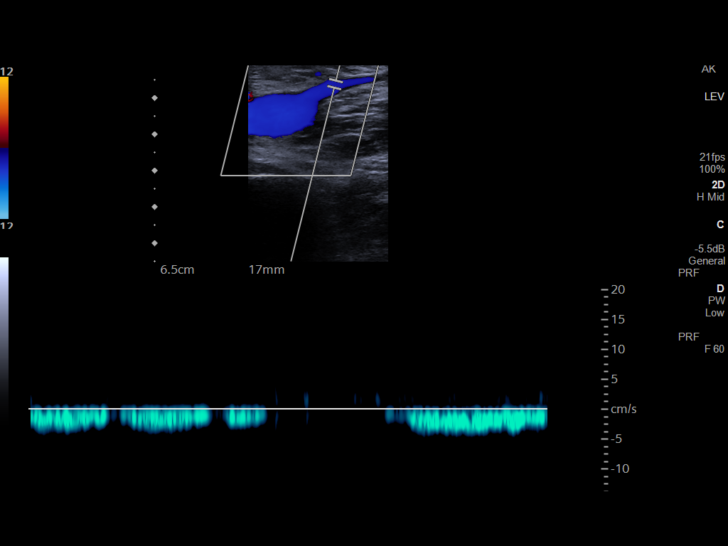
[im 15/38]
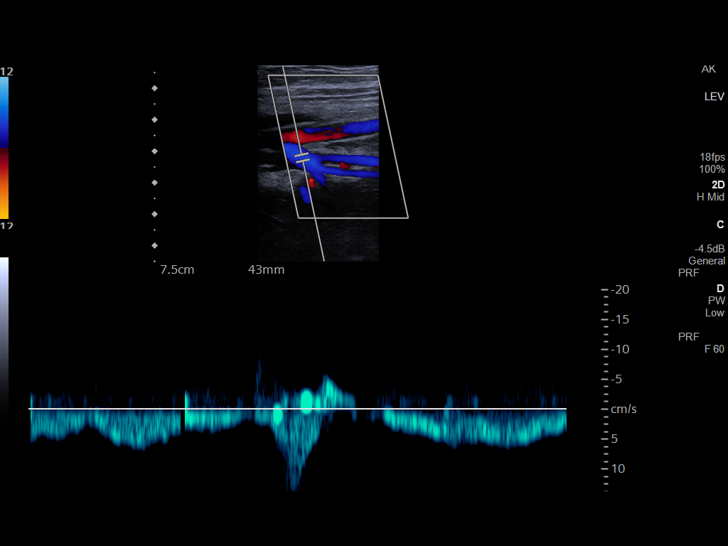
[im 18/38]
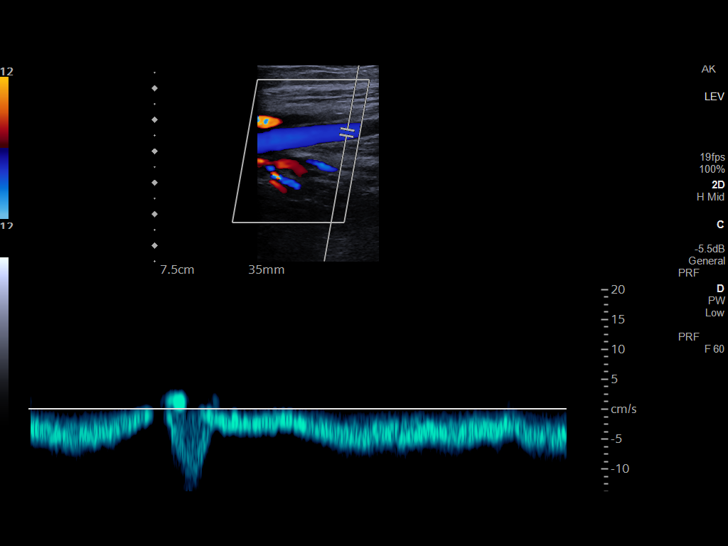
[im 20/38]
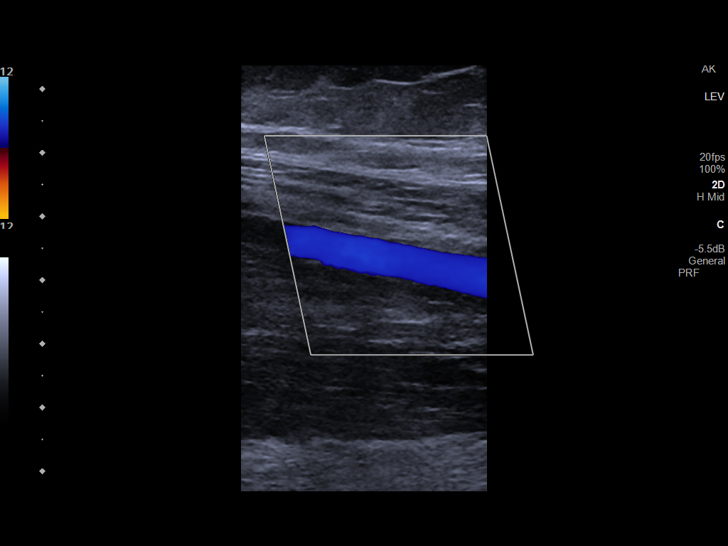
[im 23/38]
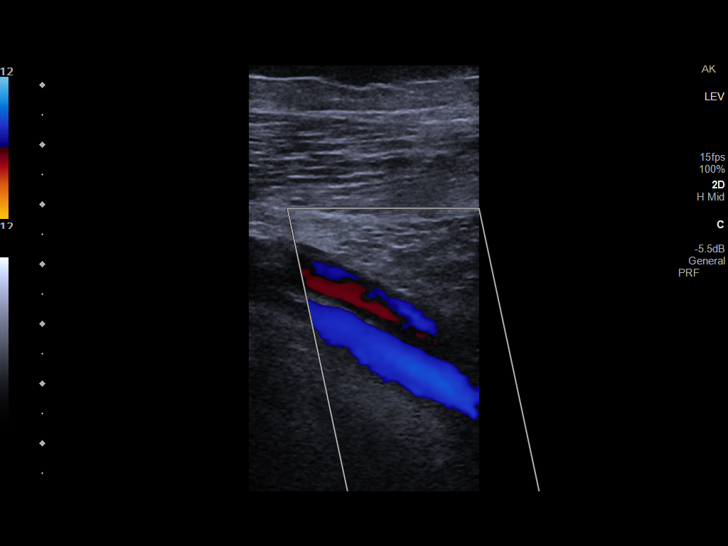
[im 26/38]
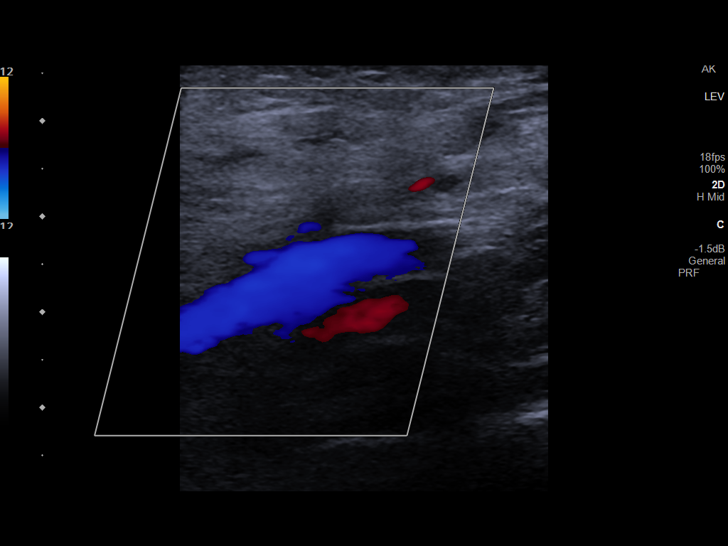
[im 29/38]
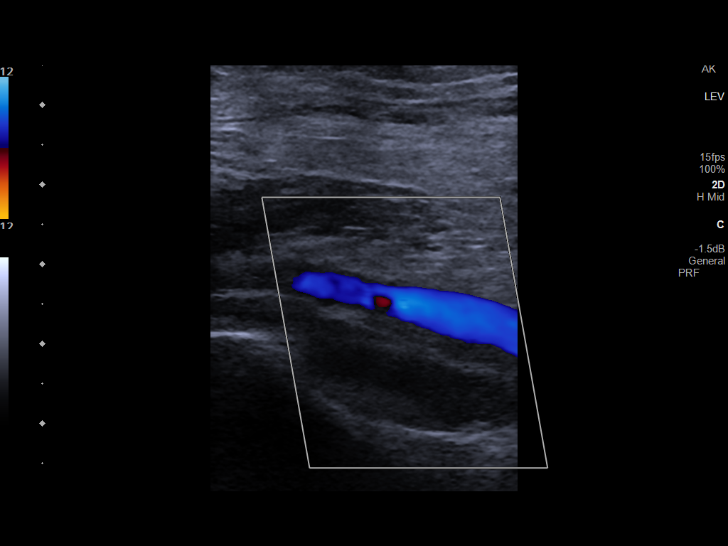
[im 31/38]
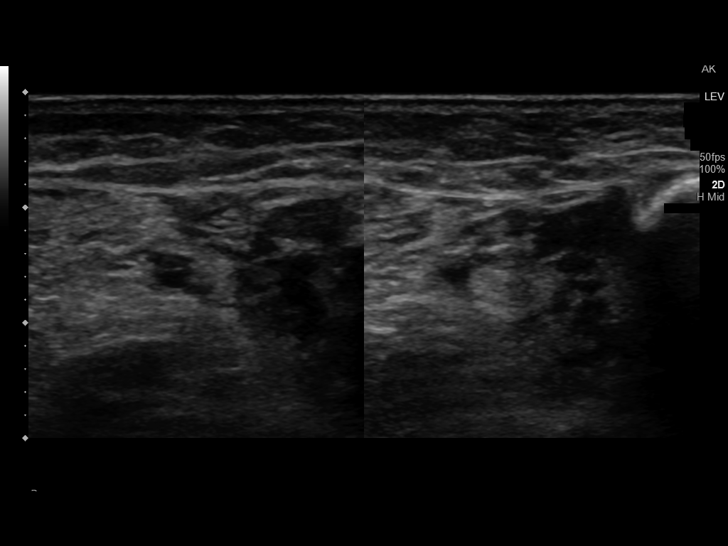
[im 34/38]
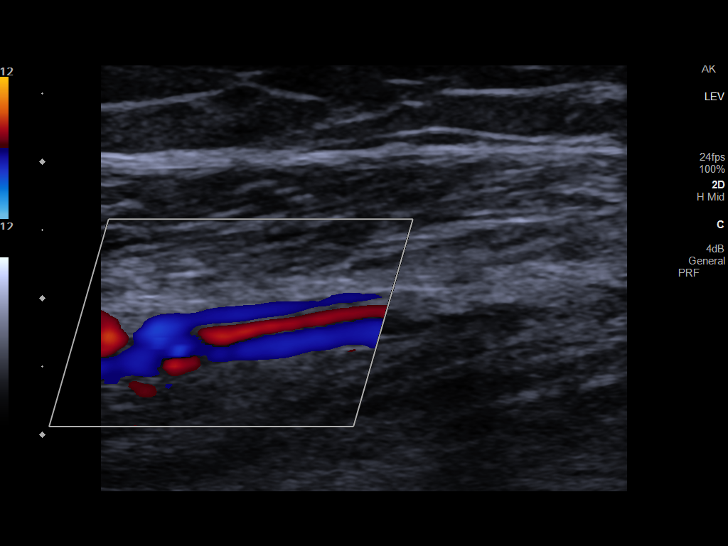
[im 38/38]
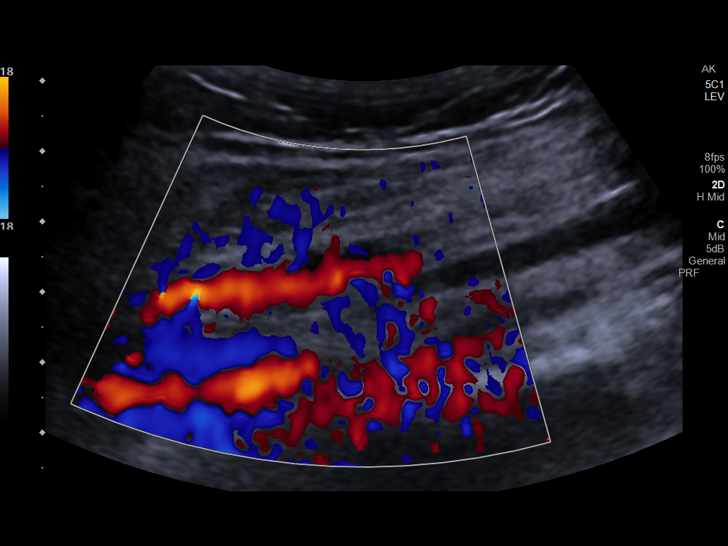

[14 of 24 positions shown; findings below may reference images not displayed]

FINDINGS: VENOUS

Normal compressibility of the common femoral, superficial femoral,
and popliteal veins, as well as the visualized calf veins.
Visualized portions of profunda femoral vein and great saphenous
vein unremarkable. No filling defects to suggest DVT on grayscale or
color Doppler imaging. Doppler waveforms show normal direction of
venous flow, normal respiratory plasticity and response to
augmentation.

Limited views of the contralateral common femoral vein are
unremarkable.

OTHER

None.

Limitations: none
IMPRESSION: Negative.

## 2021-01-31 IMAGING — CR DG FEMUR 2+V*L*
4 series · 4 of 4 positions shown · non-contrast
Comparison: None.

CLINICAL DATA: Acute LEFT leg pain for 3 days. No known injury.
Initial encounter.

EXAM:
LEFT FEMUR 2 VIEWS

[femur ap (1 of 2)]
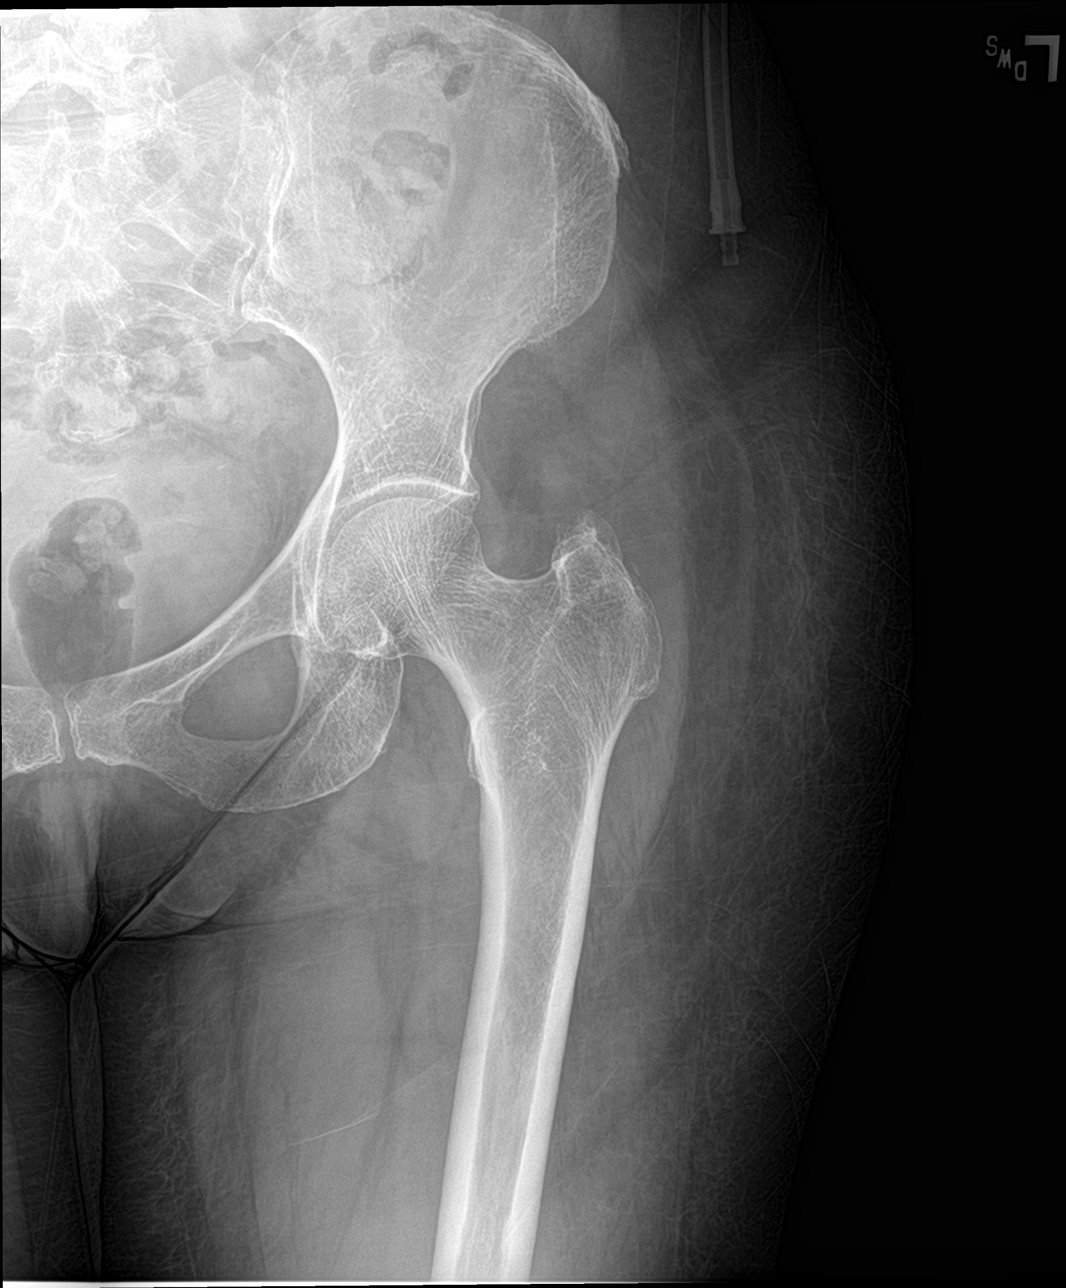

[femur ap (2 of 2)]
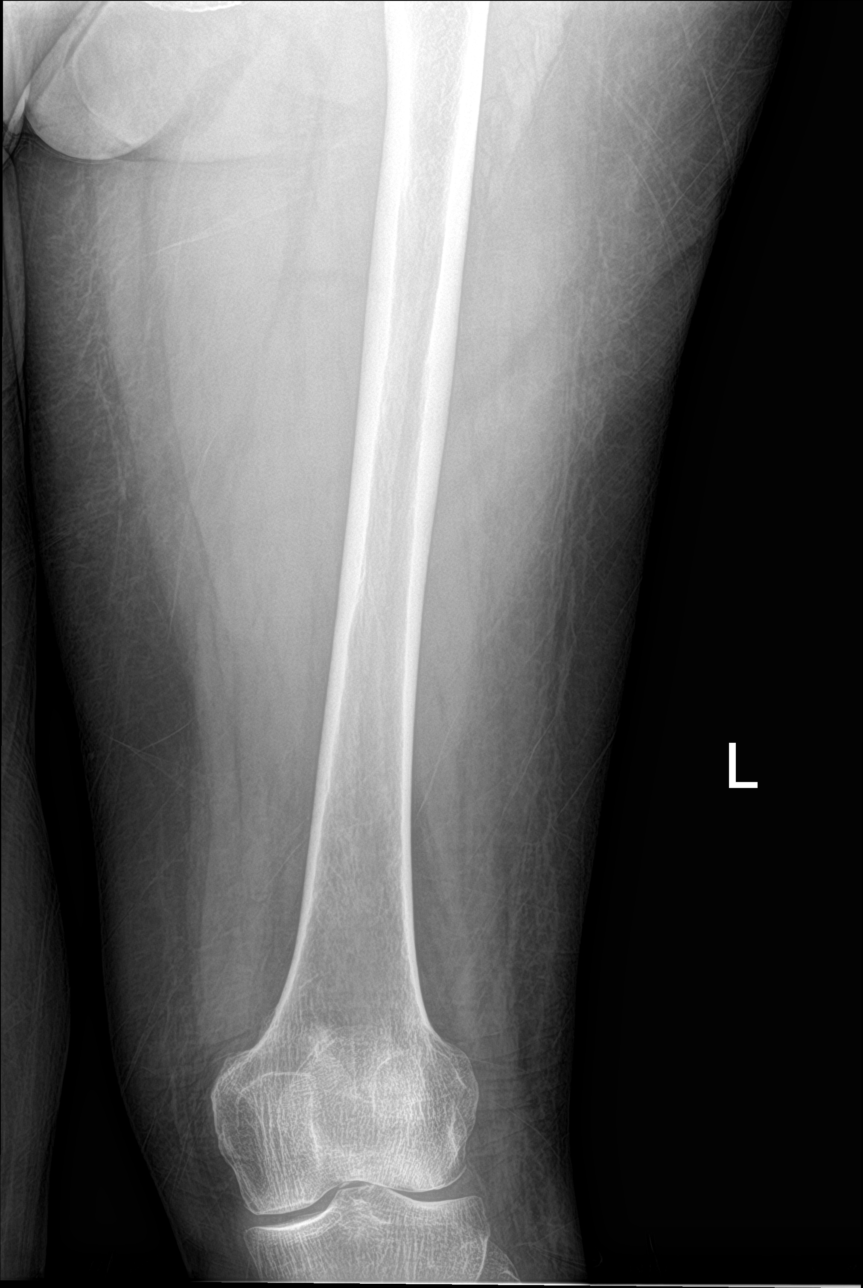

[femur lat (1 of 2)]
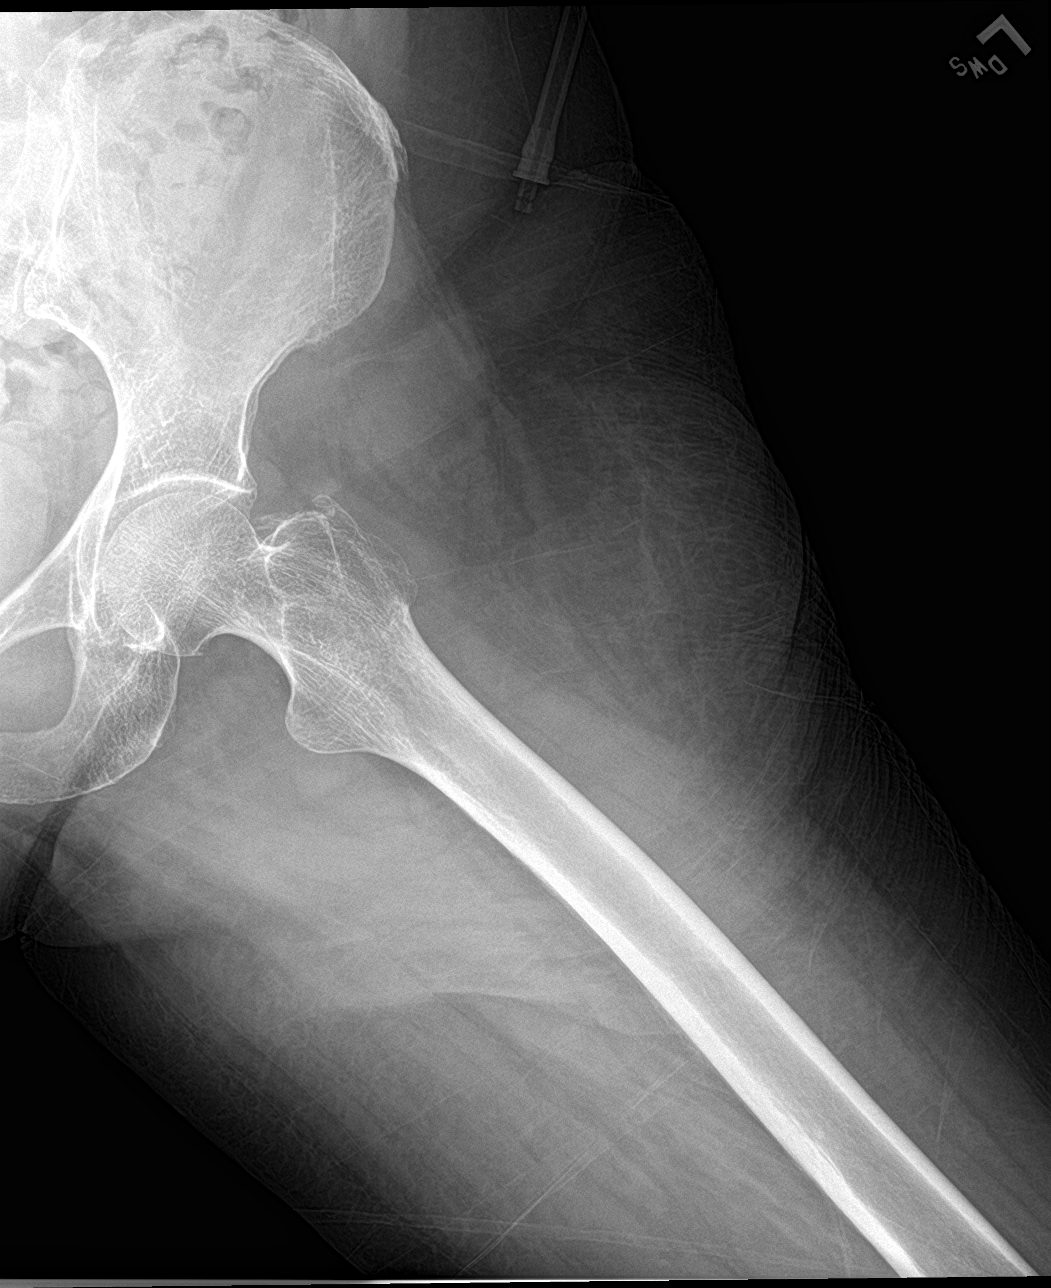

[femur lat (2 of 2)]
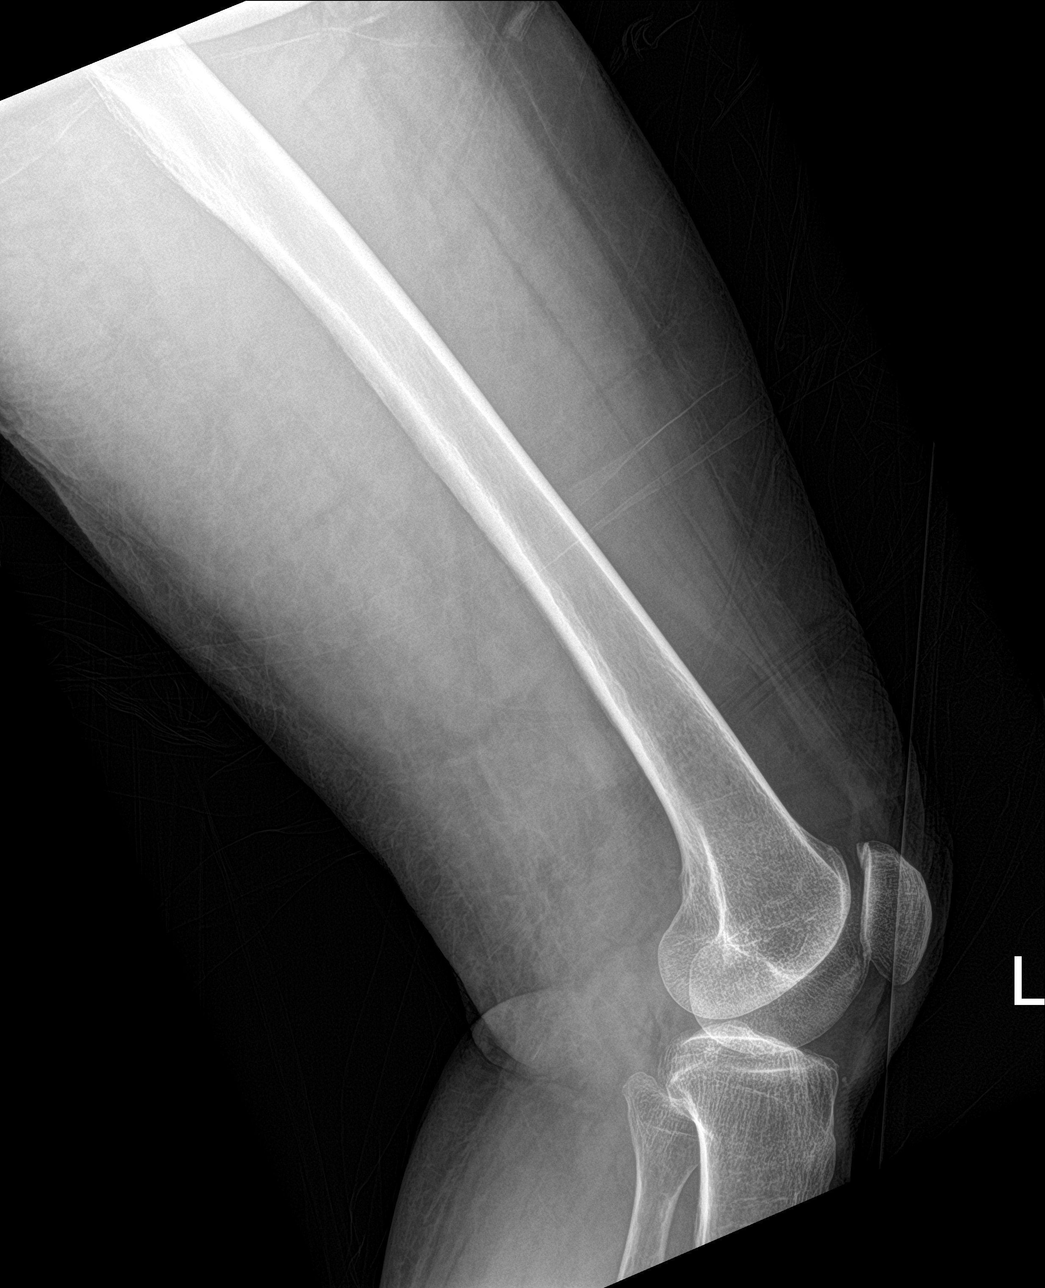

[4 of 4 positions shown; findings below may reference images not displayed]

FINDINGS: There is no evidence of fracture or other focal bone lesions. Soft
tissues are unremarkable.
IMPRESSION: Negative.

## 2021-01-31 NOTE — ED Notes (Signed)
Patient not in room at this time.

## 2021-01-31 NOTE — Discharge Instructions (Addendum)
Please follow up with Dr Ellison Hughs for repeat blood work.  Return for any pain, fever, lightheadedness or shortness of breath.

## 2021-01-31 NOTE — ED Provider Notes (Signed)
[facility] Emergency Department Provider Note    Event Date/Time   First MD Initiated Contact with Patient 01/31/21 1214     (approximate)  I have reviewed the triage vital signs and the nursing notes.   HISTORY  Chief Complaint Leg Pain    HPI Olivia Alvarado is a 70 y.o. female with below listed past medical history presents to the ER for evaluation of bruising to the left thigh and leg.  States she was having some hip pain and back pain.  Denies any trauma.  No numbness or tingling.  She is not on any blood thinners.  Symptoms started several days ago.  No history of DVT.  Does have a history of osteoporosis.  She cannot recall any sort of injury or bumping the leg has noted large bruising on the thigh.  No fevers.  Past Medical History:  Diagnosis Date   Atrophic vaginitis    History of kidney stones    IBS (irritable bowel syndrome)    Lower back pain    Nocturia    OAB (overactive bladder)    Pre-diabetes    Skin cancer    BCC   Squamous carcinoma    Suprapubic pressure    Urethral stricture    Urinary frequency    Urinary incontinence, nocturnal enuresis    Vaginal atrophy    Vulvar lesion    Family History  Problem Relation Age of Onset   Breast cancer Maternal Grandmother 84   Colon cancer Maternal Grandmother 94   Colon cancer Paternal Grandmother 85   Kidney cancer Mother 88   Rectal cancer Mother 54   Ovarian cancer Mother 66   Colon cancer Maternal Uncle 37   Colon cancer Other 61       Paternal Great Aunt   Colon cancer Other 67       Paternal Great Aunt   Arthritis Other        mother, Father   Diabetes Other    Hypertension Other    Heart disease Other        Mother, Father   Osteoporosis Other    Past Surgical History:  Procedure Laterality Date   COLONOSCOPY     KYPHOPLASTY N/A 09/15/2020   Procedure: T6, T12 Kyphoplasty;  Surgeon: Hessie Knows, MD;  Location: [facility] ORS;  Service: Orthopedics;  Laterality:  N/A;   SKIN CANCER EXCISION     Patient Active Problem List   Diagnosis Date Noted   Compression fracture of lumbar vertebra, closed, initial encounter ([place]) 08/22/2020   Irregular heart beat 08/22/2020   Peripheral edema 08/22/2020   Urinary tract infection symptoms 01/03/2019   Postprandial abdominal bloating 10/03/2016   Microscopic hematuria 08/02/2015   Adnexal pain 08/02/2015   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 Authorizing Provider  atorvastatin (LIPITOR) 10 MG tablet Take 1 tablet by mouth daily. 07/26/20   [provider]  Cholecalciferol 25 MCG (1000 UT) tablet Take 1,000 Units by mouth daily.    [provider]  FLUoxetine (PROZAC) 10 MG capsule Take 10 mg by mouth daily.    [provider]  HYDROcodone-acetaminophen (NORCO) 5-325 MG tablet Take 1 tablet by mouth every 6 (six) hours as needed for  moderate pain. 09/15/20   Hessie Knows, MD    Allergies Rosuvastatin    Social History Social History   Tobacco Use   Smoking status: Every Day    Packs/day: 0.50    Years: 40.00    Pack years: 20.00    Types: Cigarettes, E-cigarettes   Smokeless tobacco: Never  Vaping Use   Vaping Use: Every day   Substances: Nicotine  Substance Use Topics   Alcohol use: No   Drug use: No    Review of Systems Patient denies headaches, rhinorrhea, blurry vision, numbness, shortness of breath, chest pain, edema, cough, abdominal pain, nausea, vomiting, diarrhea, dysuria, fevers, rashes or hallucinations unless otherwise stated above in HPI. ____________________________________________   PHYSICAL EXAM:  VITAL SIGNS: Vitals:   01/31/21 1123  BP: (!) 145/75  Pulse: 91  Resp: 18  Temp: 98.1  F (36.7 C)  SpO2: 97%    Constitutional: Alert and oriented. Well appearing and in no acute distress. Eyes: Conjunctivae are normal.  Head: Atraumatic. Nose: No congestion/rhinnorhea. Mouth/Throat: Mucous membranes are moist.   Neck: Painless ROM.  Cardiovascular:   Good peripheral circulation.  DP and PT pulses are palpable distally.  No exam findings suggest aneurysm of the groin. Respiratory: Normal respiratory effort.  No retractions.  Gastrointestinal: Soft and nontender in all four quadrants. Musculoskeletal: Large ecchymosis of the medial thigh and posterior left knee.  Is not tense no fluctuance or tense hematoma.  Bruise does appear a few days old.  No pain with range of motion or palpation of the left hip.  Compartments are soft.  Neurovascular intact distally.  No lower extremity tenderness .  No joint effusions. Neurologic:  Normal speech and language. No gross focal neurologic deficits are appreciated.  Skin:  Skin is warm, dry and intact. No rash noted. Psychiatric: Mood and affect are normal. Speech and behavior are normal.  ____________________________________________   LABS (all labs ordered are listed, but only abnormal results are displayed)  Results for orders placed or performed during the hospital encounter of 01/31/21 (from the past 24 hour(s))  CBC     Status: Abnormal   Collection Time: 01/31/21  1:32 PM  Result Value Ref Range   WBC 7.8 4.0 - 10.5 K/uL   RBC 3.15 (L) 3.87 - 5.11 MIL/uL   Hemoglobin 9.5 (L) 12.0 - 15.0 g/dL   HCT 28.1 (L) 36.0 - 46.0 %   MCV 89.2 80.0 - 100.0 fL   MCH 30.2 26.0 - 34.0 pg   MCHC 33.8 30.0 - 36.0 g/dL   RDW 12.9 11.5 - 15.5 %   Platelets 363 150 - 400 K/uL   nRBC 0.0 0.0 - 0.2 %  Basic metabolic panel     Status: Abnormal   Collection Time: 01/31/21  1:32 PM  Result Value Ref Range   Sodium 139 135 - 145 mmol/L   Potassium 3.6 3.5 - 5.1 mmol/L   Chloride 104 98 - 111 mmol/L   CO2 26 22 - 32 mmol/L   Glucose, Bld  117 (H) 70 - 99 mg/dL   BUN 16 8 - 23 mg/dL   Creatinine, Ser 0.45 0.44 - 1.00 mg/dL   Calcium 8.5 (L) 8.9 - 10.3 mg/dL   GFR, Estimated >60 >60 mL/min   Anion gap 9 5 - 15  Protime-INR     Status: None   Collection Time: 01/31/21  1:32 PM  Result Value Ref Range   Prothrombin Time 13.7 11.4 - 15.2 seconds   INR  1.1 0.8 - 1.2   ____________________________________________ ____________________________________________  RADIOLOGY  I personally reviewed all radiographic images ordered to evaluate for the above acute complaints and reviewed radiology reports and findings.  These findings were personally discussed with the patient.  Please see medical record for radiology report.  ____________________________________________   PROCEDURES  Procedure(s) performed:  Procedures    Critical Care performed: no ____________________________________________   INITIAL IMPRESSION / ASSESSMENT AND PLAN / ED COURSE  Pertinent labs & imaging results that were available during my care of the patient were reviewed by me and considered in my medical decision making (see chart for details).   DDX: Contusion, hematoma, fracture, dislocation, DVT, thrombocytopenia, varicose vein, dissection, retroperitoneal injury or bleed  Olivia Alvarado is a 70 y.o. who presents to the ED with presentation as described above.  Patient well-appearing in no acute distress.  She has contusion and ecchymosis of varying stages of the left thigh but the compartment is soft.  She is able to range her left leg without any significant discomfort she denies any pain or flank pain.  Would expect some sort of back or flank pain if she were to have dissection or pelvic/retroperitoneal injury and she denies any trauma.  She denies any blood thinners.  No findings suggestive of acute infection.  Imaging and blood will be sent for the but differential.  Clinical Course as of 01/31/21 1417  Sun Jan 31, 2021  1412 Patient  reassessed.  She has absolutely no pain or discomfort on examination.  Her x-rays are reassuring.  She denies any melena or hematochezia.  Her abdominal exam is soft and benign to deep palpation.  Her hemoglobin is lower than previous denies any bleeding trauma she not on anticoagulation.  Platelets are normal.  PT/INR is normal.  She does not Alvarado any symptoms of petechia.  Though she denies any trauma suspect that she may have had some minor injury to her leg given the bruising.  Do not believe that she requires hospitalization at this time.  She is able to walk without any pain or discomfort or signs or symptoms of symptomatic anemia or findings to suggest acute blood loss anemia I do believe she is stable and will be appropriate for close outpatient follow-up. [PR]    Clinical Course User Index [PR] Merlyn Lot, MD     ____________________________________________   FINAL CLINICAL IMPRESSION(S) / ED DIAGNOSES  Final diagnoses:  Left leg pain  Contusion of left lower leg, initial encounter      NEW MEDICATIONS STARTED DURING THIS VISIT:  New Prescriptions   No medications on file     Note:  This document was prepared using Dragon voice recognition software and may include unintentional dictation errors.     Merlyn Lot, MD 01/31/21 [phone]

## 2021-01-31 NOTE — ED Notes (Signed)
Patient declined discharge vital signs. 

## 2021-01-31 NOTE — ED Triage Notes (Signed)
Pt via POV from home. Pt states that on Thurs she had  pain starting his her L hip that radiates down her leg. Pt has a hx of sciatica. Pt states it got better on Friday but this AM she woke up with her L leg soreness and bruising to the L hip and inner thigh. Denies any injury to it. Denies blood thinners. Denies falls or any injuries to the L leg. Denies pain states she is now just sore. Pt is A&Ox4 and NAD.

## 2021-02-08 ENCOUNTER — Other Ambulatory Visit: Payer: Self-pay

## 2021-02-08 ENCOUNTER — Inpatient Hospital Stay
Admission: EM | Admit: 2021-02-08 | Discharge: 2021-02-10 | DRG: 558 | Disposition: A | Discharge status: Home / Self Care | Payer: Medicare Other | Attending: Student | Admitting: Student

## 2021-02-08 DIAGNOSIS — E559 Vitamin D deficiency, unspecified: Secondary | ICD-10-CM | POA: Diagnosis present

## 2021-02-08 DIAGNOSIS — F17200 Nicotine dependence, unspecified, uncomplicated: Secondary | ICD-10-CM | POA: Diagnosis present

## 2021-02-08 DIAGNOSIS — Z8262 Family history of osteoporosis: Secondary | ICD-10-CM

## 2021-02-08 DIAGNOSIS — M7062 Trochanteric bursitis, left hip: Principal | ICD-10-CM | POA: Diagnosis present

## 2021-02-08 DIAGNOSIS — D649 Anemia, unspecified: Secondary | ICD-10-CM | POA: Diagnosis present

## 2021-02-08 DIAGNOSIS — Z800 Family history of malignant neoplasm of digestive organs: Secondary | ICD-10-CM

## 2021-02-08 DIAGNOSIS — E785 Hyperlipidemia, unspecified: Secondary | ICD-10-CM | POA: Diagnosis present

## 2021-02-08 DIAGNOSIS — Z803 Family history of malignant neoplasm of breast: Secondary | ICD-10-CM

## 2021-02-08 DIAGNOSIS — Z20822 Contact with and (suspected) exposure to covid-19: Secondary | ICD-10-CM | POA: Diagnosis present

## 2021-02-08 DIAGNOSIS — K589 Irritable bowel syndrome without diarrhea: Secondary | ICD-10-CM | POA: Diagnosis present

## 2021-02-08 DIAGNOSIS — M79605 Pain in left leg: Secondary | ICD-10-CM | POA: Diagnosis not present

## 2021-02-08 DIAGNOSIS — Z85828 Personal history of other malignant neoplasm of skin: Secondary | ICD-10-CM

## 2021-02-08 DIAGNOSIS — Z8041 Family history of malignant neoplasm of ovary: Secondary | ICD-10-CM

## 2021-02-08 DIAGNOSIS — Z8249 Family history of ischemic heart disease and other diseases of the circulatory system: Secondary | ICD-10-CM

## 2021-02-08 DIAGNOSIS — N390 Urinary tract infection, site not specified: Secondary | ICD-10-CM | POA: Diagnosis present

## 2021-02-08 DIAGNOSIS — E871 Hypo-osmolality and hyponatremia: Secondary | ICD-10-CM

## 2021-02-08 DIAGNOSIS — Z833 Family history of diabetes mellitus: Secondary | ICD-10-CM

## 2021-02-08 DIAGNOSIS — R7303 Prediabetes: Secondary | ICD-10-CM | POA: Diagnosis present

## 2021-02-08 DIAGNOSIS — Z8051 Family history of malignant neoplasm of kidney: Secondary | ICD-10-CM

## 2021-02-08 DIAGNOSIS — Z79899 Other long term (current) drug therapy: Secondary | ICD-10-CM

## 2021-02-08 DIAGNOSIS — F32A Depression, unspecified: Secondary | ICD-10-CM | POA: Diagnosis present

## 2021-02-08 LAB — CBC
HCT: 31.6 % — ABNORMAL LOW (ref 36.0–46.0)
Hemoglobin: 10.1 g/dL — ABNORMAL LOW (ref 12.0–15.0)
MCH: 29.4 pg (ref 26.0–34.0)
MCHC: 32 g/dL (ref 30.0–36.0)
MCV: 91.9 fL (ref 80.0–100.0)
Platelets: 524 10*3/uL — ABNORMAL HIGH (ref 150–400)
RBC: 3.44 MIL/uL — ABNORMAL LOW (ref 3.87–5.11)
RDW: 13.9 % (ref 11.5–15.5)
WBC: 7.4 10*3/uL (ref 4.0–10.5)
nRBC: 0 % (ref 0.0–0.2)

## 2021-02-08 NOTE — ED Provider Notes (Signed)
[facility] Emergency Department Provider Note ____________________________________________   Event Date/Time   First MD Initiated Contact with Patient 02/08/21 2346     (approximate)  I have reviewed the triage vital signs and the nursing notes.  HISTORY  Chief Complaint Leg Pain   HPI ARDINE IACOVELLI is a 70 y.o. femalewho presents to the ED for evaluation of left leg pain.   Chart review indicates patient was seen 1 week ago in our ED due to atraumatic bruising of her left proximal leg, benign work-up and discharged home.  No thinners.  Outpatient blood work from PCP follow-up on 11/4 with sodium 141.  Patient returns to the ED this evening, accompanied by her husband, for evaluation of worsening left leg pain.  She reports difficulty ambulating due to pain of her left lateral leg.  She reports the bruising to her left thigh has improved, and descended to her left ankle, but reports developing increasing pain to her left lateral hip, radiating to her left knee along her lateral left thigh.  Denies any falls or trauma.  Denies fevers, abdominal pain, emesis, dysuria, hematuria, epistaxis, weakness to that left leg, back pain or injury.   Past Medical History:  Diagnosis Date   Atrophic vaginitis    History of kidney stones    IBS (irritable bowel syndrome)    Lower back pain    Nocturia    OAB (overactive bladder)    Pre-diabetes    Skin cancer    BCC   Squamous carcinoma    Suprapubic pressure    Urethral stricture    Urinary frequency    Urinary incontinence, nocturnal enuresis    Vaginal atrophy    Vulvar lesion     Patient Active Problem List   Diagnosis Date Noted   Compression fracture of lumbar vertebra, closed, initial encounter ([place]) 08/22/2020   Irregular heart beat 08/22/2020   Peripheral edema 08/22/2020   Urinary tract infection symptoms 01/03/2019   Postprandial abdominal bloating 10/03/2016   Microscopic hematuria  08/02/2015   Adnexal pain 08/02/2015   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 Authorizing Provider  atorvastatin (LIPITOR) 10 MG tablet Take 1 tablet by mouth daily. 07/26/20   [provider]  Cholecalciferol 25 MCG (1000 UT) tablet Take 1,000 Units by mouth daily.    [provider]  FLUoxetine (PROZAC) 10 MG capsule Take 10 mg by mouth daily.    [provider]  HYDROcodone-acetaminophen (NORCO) 5-325 MG tablet Take 1 tablet by mouth every 6 (six) hours as needed for moderate pain. 09/15/20   Hessie Knows, MD    Allergies Rosuvastatin  Family History  Problem Relation Age of Onset   Breast cancer Maternal Grandmother 50   Colon cancer Maternal Grandmother 33   Colon cancer Paternal Grandmother 59   Kidney cancer Mother 78   Rectal cancer Mother 33   Ovarian cancer Mother 48   Colon cancer Maternal Uncle 53   Colon  cancer Other 28       Paternal Great Aunt   Colon cancer Other 30       Paternal Great Aunt   Arthritis Other        mother, Father   Diabetes Other    Hypertension Other    Heart disease Other        Mother, Father   Osteoporosis Other     Social History Social History   Tobacco Use   Smoking status: Every Day    Packs/day: 0.50    Years: 40.00    Pack years: 20.00    Types: Cigarettes, E-cigarettes   Smokeless tobacco: Never  Vaping Use   Vaping Use: Every day   Substances: Nicotine  Substance Use Topics   Alcohol use: No    Drug use: No    Review of Systems  Constitutional: No fever/chills Eyes: No visual changes. ENT: No sore throat. Cardiovascular: Denies chest pain. Respiratory: Denies shortness of breath. Gastrointestinal: No abdominal pain.  No nausea, no vomiting.  No diarrhea.  No constipation. Genitourinary: Negative for dysuria. Musculoskeletal: Negative for back pain. Positive for atraumatic left leg pain and left lower leg bruising Skin: Negative for rash. Neurological: Negative for headaches, focal weakness or numbness.  ____________________________________________   PHYSICAL EXAM:  VITAL SIGNS: Vitals:   02/08/21 2356 02/09/21 0100  BP: 139/74 129/66  Pulse: 82 86  Resp: 15 14  Temp:    SpO2: 100% 99%     Constitutional: Alert and oriented. Well appearing and in no acute distress.  Pleasant and conversational in full sentences. Eyes: Conjunctivae are normal. PERRL. EOMI. Head: Atraumatic. Nose: No congestion/rhinnorhea. Mouth/Throat: Mucous membranes are moist.  Oropharynx non-erythematous. Neck: No stridor. No cervical spine tenderness to palpation. Cardiovascular: Normal rate, regular rhythm. Grossly normal heart sounds.  Good peripheral circulation. Respiratory: Normal respiratory effort.  No retractions. Lungs CTAB. Gastrointestinal: Soft , nondistended, nontender to palpation. No CVA tenderness. Musculoskeletal:  Soft tissue swelling and bruising to distal left lower extremity, swelling to dorsal foot, ankle and lower leg that is nontender.  Full range of motion throughout ankle and knee without pain. No signs of trauma to the more proximal portion of the leg, though she is quite tender over her tensor fascia lata and extending into her IT band, reproducing her painful symptoms that she presents with today. No pain or tenderness to medial portion of the leg.  ROM intact, but causes discomfort to the lateral aspect of her left leg. Neurologic:  Normal speech and language.  No gross focal neurologic deficits are appreciated. No gait instability noted. Skin:  Skin is warm, dry and intact. No rash noted. Psychiatric: Mood and affect are normal. Speech and behavior are normal.  ____________________________________________   LABS (all labs ordered are listed, but only abnormal results are displayed)  Labs Reviewed  CBC - Abnormal; Notable for the following components:      Result Value   RBC 3.44 (*)    Hemoglobin 10.1 (*)    HCT 31.6 (*)    Platelets 524 (*)    All other components within normal limits  BASIC METABOLIC PANEL - Abnormal; Notable for the following components:   Sodium 121 (*)    Chloride 95 (*)    Glucose, Bld 104 (*)    Calcium 7.9 (*)    Anion gap 1 (*)    All other components within normal limits  BASIC METABOLIC PANEL - Abnormal; Notable for the following components:   Sodium  121 (*)    Glucose, Bld 162 (*)    Calcium 8.1 (*)    Anion gap <0 (*)    All other components within normal limits  URINALYSIS, COMPLETE (UACMP) WITH MICROSCOPIC - Abnormal; Notable for the following components:   Color, Urine AMBER (*)    APPearance TURBID (*)    Hgb urine dipstick SMALL (*)    Protein, ur 30 (*)    Leukocytes,Ua MODERATE (*)    WBC, UA >50 (*)    All other components within normal limits  URINE CULTURE   ____________________________________________  12 Lead EKG   ____________________________________________  RADIOLOGY  ED MD interpretation: CT pelvis reviewed by me with evidence of left trochanteric bursitis  Official radiology report(s): CT PELVIS W CONTRAST  Result Date: 02/09/2021 CLINICAL DATA:  Severe left hip pain, atraumatic EXAM: CT PELVIS WITH CONTRAST TECHNIQUE: Multidetector CT imaging of the pelvis was performed using the standard protocol following the bolus administration of intravenous contrast. CONTRAST:  148mL OMNIPAQUE IOHEXOL 300 MG/ML  SOLN COMPARISON:  None. FINDINGS: Urinary Tract:  Bladder is within  normal limits. Bowel:  Sigmoid diverticulosis, without evidence of diverticulitis. Vascular/Lymphatic: No evidence of aneurysm. No suspicious pelvic lymphadenopathy. Reproductive: Suspected calcified fundal fibroids. Uterus is otherwise unremarkable. Bilateral ovaries are within normal limits. Other:  No pelvic ascites. Musculoskeletal: Mild degenerative changes at L4-5. No fracture is seen.  Bilateral hip joint spaces are preserved. Multiloculated fluid collection along the left greater trochanteric bursa (series 2/image 34), involving the gluteus minimus (series 2/image 22), gluteus medius (series 2/image 29), and gluteus maximus musculature (series 2/image 33). Collection measures approximately 2.4 x 9.5 x 11.9 cm in aggregate (series 2/image 31; coronal image 43). Some of these collections demonstrate mild rim enhancement (series 2/image 27). Overlying mild subcutaneous stranding (series 2/image 30). In the absence of trauma, this appearance favors greater trochanteric bursitis. IMPRESSION: Suspected left greater trochanteric bursitis, as above. Electronically Signed   By: Julian Hy M.D.   On: 02/09/2021 01:48   US Venous Img Lower Unilateral Left  Result Date: 02/09/2021 CLINICAL DATA:  Left lower extremity pain. EXAM: Left LOWER EXTREMITY VENOUS DOPPLER ULTRASOUND TECHNIQUE: Gray-scale sonography with compression, as well as color and duplex ultrasound, were performed to evaluate the deep venous system(s) from the level of the common femoral vein through the popliteal and proximal calf veins. COMPARISON:  None. FINDINGS: VENOUS Normal compressibility of the common femoral, superficial femoral, and popliteal veins, as well as the visualized calf veins. Visualized portions of profunda femoral vein and great saphenous vein unremarkable. No filling defects to suggest DVT on grayscale or color Doppler imaging. Doppler waveforms show normal direction of venous flow, normal respiratory plasticity and  response to augmentation. Limited views of the contralateral common femoral vein are unremarkable. OTHER There is a complex cystic area or collection with internal echogenic debris in the posterior thigh extending to the popliteal region measuring approximately 6 x 2 x 22 cm. Findings may represent an old hematoma or a large complex Baker cyst. Clinical correlation is recommended. CT or MRI may provide better evaluation if clinically indicated and on a nonemergent basis. Limitations: none IMPRESSION: 1. No sonographic findings of DVT. 2. Large complex collection in the posterior thigh. Electronically Signed   By: Anner Crete M.D.   On: 02/09/2021 01:22    ____________________________________________   PROCEDURES and INTERVENTIONS  Procedure(s) performed (including Critical Care):  .1-3 Lead EKG Interpretation Performed by: Vladimir Crofts, MD Authorized by: Vladimir Crofts, MD  Interpretation: normal     ECG rate:  80   ECG rate assessment: normal     Rhythm: sinus rhythm     Ectopy: none     Conduction: normal    Medications  sodium chloride 0.9 % bolus 1,000 mL (has no administration in time range)  iohexol (OMNIPAQUE) 300 MG/ML solution 100 mL (100 mLs Intravenous Contrast Given 02/09/21 0119)    ____________________________________________   MDM / ED COURSE   69 year old woman presents to the ED with atraumatic left hip pain with evidence of greater trochanteric bursitis, found to have fairly severe hyponatremia as well of uncertain etiology, requiring medical admission.  She looks clinically well with normal vital signs.  She is most tender along her iliotibial band and tensor fascia lata musculature, but has no overlying signs of trauma.  She has bruising that has settled down to her left ankle and distal extremity.  Blood work with hyponatremia, that persists on repeat, and we will start her on normal saline to correct this.  Her urinalysis is noted to have significant white  cells, clouded by squames, and she has no symptoms and no further evidence of infection or sepsis.  We will send for a culture and withhold antibiotics at this time.  We will discuss with medicine for admission.  Clinical Course as of 02/09/21 0200  Tue Feb 09, 2021  0200 Reassessed and updated patient on work-up.  We discussed persistent hyponatremia my recommendation for admission.  She is agreeable.  Further discussed greater trochanteric bursitis. [DS]    Clinical Course User Index [DS] Vladimir Crofts, MD    ____________________________________________   FINAL CLINICAL IMPRESSION(S) / ED DIAGNOSES  Final diagnoses:  Hyponatremia  Trochanteric bursitis of left hip     ED Discharge Orders     None        Masiel Gentzler Tamala Julian   Note:  This document was prepared using Dragon voice recognition software and may include unintentional dictation errors.    Vladimir Crofts, MD 02/09/21 [phone]

## 2021-02-08 NOTE — ED Notes (Signed)
Dr. Smith at the bedside.  

## 2021-02-08 NOTE — ED Triage Notes (Signed)
Pt presents to ER c/o left leg pain.  Pt states she has been seen here recently for same and has not had definitive dx.  Pt states she is having pain that starts in her upper left and moves down to her foot.  Pt denies hx blood clots.  Pt states she has bruising noted to her left foot which is new.  Pt's foot noted to be blue in color.  Normal temperature and good pedal pulses noted to foot.

## 2021-02-09 ENCOUNTER — Emergency Department: Payer: Medicare Other

## 2021-02-09 ENCOUNTER — Inpatient Hospital Stay: Payer: Medicare Other

## 2021-02-09 DIAGNOSIS — F32A Depression, unspecified: Secondary | ICD-10-CM

## 2021-02-09 DIAGNOSIS — M7062 Trochanteric bursitis, left hip: Secondary | ICD-10-CM | POA: Diagnosis present

## 2021-02-09 DIAGNOSIS — N390 Urinary tract infection, site not specified: Secondary | ICD-10-CM | POA: Diagnosis present

## 2021-02-09 DIAGNOSIS — Z833 Family history of diabetes mellitus: Secondary | ICD-10-CM | POA: Diagnosis not present

## 2021-02-09 DIAGNOSIS — Z803 Family history of malignant neoplasm of breast: Secondary | ICD-10-CM | POA: Diagnosis not present

## 2021-02-09 DIAGNOSIS — E785 Hyperlipidemia, unspecified: Secondary | ICD-10-CM

## 2021-02-09 DIAGNOSIS — Z800 Family history of malignant neoplasm of digestive organs: Secondary | ICD-10-CM | POA: Diagnosis not present

## 2021-02-09 DIAGNOSIS — F17200 Nicotine dependence, unspecified, uncomplicated: Secondary | ICD-10-CM | POA: Diagnosis present

## 2021-02-09 DIAGNOSIS — E871 Hypo-osmolality and hyponatremia: Secondary | ICD-10-CM | POA: Diagnosis present

## 2021-02-09 DIAGNOSIS — Z8262 Family history of osteoporosis: Secondary | ICD-10-CM | POA: Diagnosis not present

## 2021-02-09 DIAGNOSIS — M79605 Pain in left leg: Secondary | ICD-10-CM | POA: Diagnosis present

## 2021-02-09 DIAGNOSIS — K589 Irritable bowel syndrome without diarrhea: Secondary | ICD-10-CM | POA: Diagnosis present

## 2021-02-09 DIAGNOSIS — Z20822 Contact with and (suspected) exposure to covid-19: Secondary | ICD-10-CM | POA: Diagnosis present

## 2021-02-09 DIAGNOSIS — Z8041 Family history of malignant neoplasm of ovary: Secondary | ICD-10-CM | POA: Diagnosis not present

## 2021-02-09 DIAGNOSIS — Z8051 Family history of malignant neoplasm of kidney: Secondary | ICD-10-CM | POA: Diagnosis not present

## 2021-02-09 DIAGNOSIS — Z8249 Family history of ischemic heart disease and other diseases of the circulatory system: Secondary | ICD-10-CM | POA: Diagnosis not present

## 2021-02-09 DIAGNOSIS — E559 Vitamin D deficiency, unspecified: Secondary | ICD-10-CM | POA: Diagnosis present

## 2021-02-09 DIAGNOSIS — Z79899 Other long term (current) drug therapy: Secondary | ICD-10-CM | POA: Diagnosis not present

## 2021-02-09 DIAGNOSIS — D649 Anemia, unspecified: Secondary | ICD-10-CM | POA: Diagnosis present

## 2021-02-09 DIAGNOSIS — R7303 Prediabetes: Secondary | ICD-10-CM | POA: Diagnosis present

## 2021-02-09 DIAGNOSIS — Z85828 Personal history of other malignant neoplasm of skin: Secondary | ICD-10-CM | POA: Diagnosis not present

## 2021-02-09 LAB — CBC
HCT: 30.6 % — ABNORMAL LOW (ref 36.0–46.0)
Hemoglobin: 9.8 g/dL — ABNORMAL LOW (ref 12.0–15.0)
MCH: 29.2 pg (ref 26.0–34.0)
MCHC: 32 g/dL (ref 30.0–36.0)
MCV: 91.1 fL (ref 80.0–100.0)
Platelets: 475 10*3/uL — ABNORMAL HIGH (ref 150–400)
RBC: 3.36 MIL/uL — ABNORMAL LOW (ref 3.87–5.11)
RDW: 13.9 % (ref 11.5–15.5)
WBC: 6.1 10*3/uL (ref 4.0–10.5)
nRBC: 0 % (ref 0.0–0.2)

## 2021-02-09 LAB — URINALYSIS, COMPLETE (UACMP) WITH MICROSCOPIC
Bacteria, UA: NONE SEEN
Bilirubin Urine: NEGATIVE
Glucose, UA: NEGATIVE mg/dL
Ketones, ur: NEGATIVE mg/dL
Nitrite: NEGATIVE
Protein, ur: 30 mg/dL — AB
Specific Gravity, Urine: 1.023 (ref 1.005–1.030)
WBC, UA: >50 WBC/hpf — ABNORMAL HIGH (ref 0–5)
pH: 5 (ref 5.0–8.0)

## 2021-02-09 LAB — VITAMIN D 25 HYDROXY (VIT D DEFICIENCY, FRACTURES): Vit D, 25-Hydroxy: 50.68 ng/mL (ref 30–100)

## 2021-02-09 LAB — BASIC METABOLIC PANEL
Anion gap: 6 (ref 5–15)
Anion gap: 6 (ref 5–15)
BUN: 15 mg/dL (ref 8–23)
BUN: 13 mg/dL (ref 8–23)
CO2: 26 mmol/L (ref 22–32)
CO2: 26 mmol/L (ref 22–32)
Calcium: 8.4 mg/dL — ABNORMAL LOW (ref 8.9–10.3)
Calcium: 8.6 mg/dL — ABNORMAL LOW (ref 8.9–10.3)
Chloride: 108 mmol/L (ref 98–111)
Chloride: 106 mmol/L (ref 98–111)
Creatinine, Ser: 0.38 mg/dL — ABNORMAL LOW (ref 0.44–1.00)
Creatinine, Ser: 0.41 mg/dL — ABNORMAL LOW (ref 0.44–1.00)
GFR, Estimated: >60 mL/min (ref >60)
GFR, Estimated: >60 mL/min (ref >60)
Glucose, Bld: 106 mg/dL — ABNORMAL HIGH (ref 70–99)
Glucose, Bld: 94 mg/dL (ref 70–99)
Potassium: 4 mmol/L (ref 3.5–5.1)
Potassium: 4.1 mmol/L (ref 3.5–5.1)
Sodium: 140 mmol/L (ref 135–145)
Sodium: 138 mmol/L (ref 135–145)

## 2021-02-09 LAB — RESP PANEL BY RT-PCR (FLU A&B, COVID) ARPGX2
Influenza A by PCR: NEGATIVE
Influenza B by PCR: NEGATIVE
SARS Coronavirus 2 by RT PCR: NEGATIVE

## 2021-02-09 LAB — IRON AND TIBC
Iron: 46 ug/dL (ref 28–170)
Saturation Ratios: 14 % (ref 10.4–31.8)
TIBC: 337 ug/dL (ref 250–450)
UIBC: 291 ug/dL

## 2021-02-09 LAB — TSH: TSH: 2.468 u[IU]/mL (ref 0.350–4.500)

## 2021-02-09 LAB — URINE CULTURE: Culture: <10000 — AB

## 2021-02-09 LAB — VITAMIN B12: Vitamin B-12: 233 pg/mL (ref 180–914)

## 2021-02-09 LAB — OSMOLALITY: Osmolality: 295 mOsm/kg (ref 275–295)

## 2021-02-09 LAB — FOLATE: Folate: 21.4 ng/mL (ref >5.9)

## 2021-02-09 IMAGING — US US EXTREM LOW VENOUS*L*
1 series · 13 of 24 positions shown · non-contrast
Comparison: None.

CLINICAL DATA: Left lower extremity pain.

EXAM:
Left LOWER EXTREMITY VENOUS DOPPLER ULTRASOUND
TECHNIQUE: Gray-scale sonography with compression, as well as color and duplex
ultrasound, were performed to evaluate the deep venous system(s)
from the level of the common femoral vein through the popliteal and
proximal calf veins.

[Series 1: us venous img lower uni left (dvt) · portal-venous · 51 acquisitions, 13 frames shown]
[im 1/51]
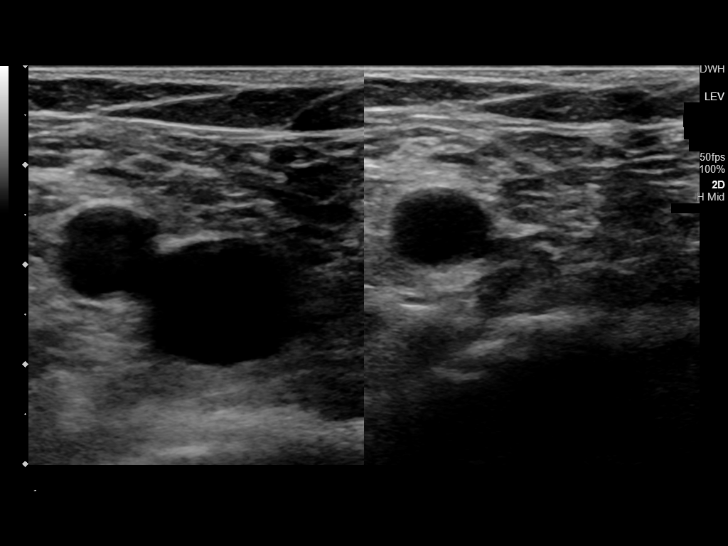
[im 5/51]
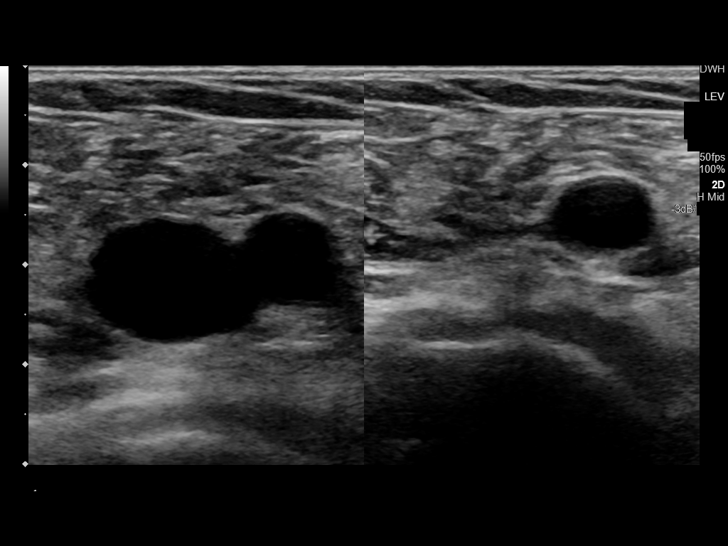
[im 9/51]
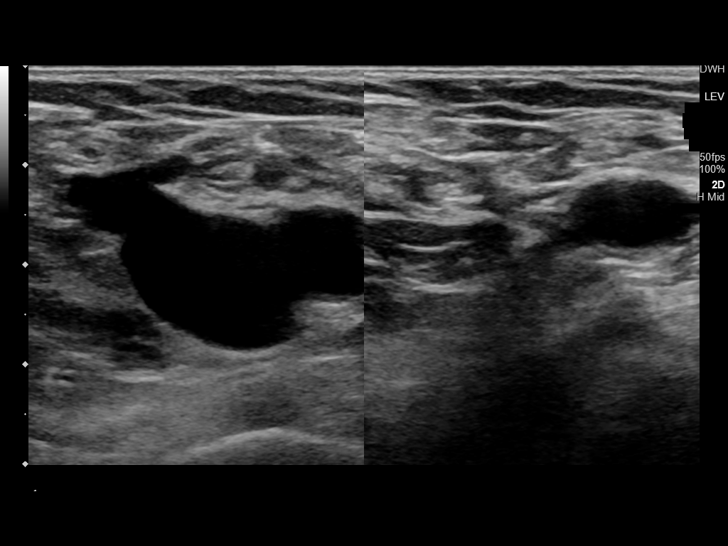
[im 16/51]
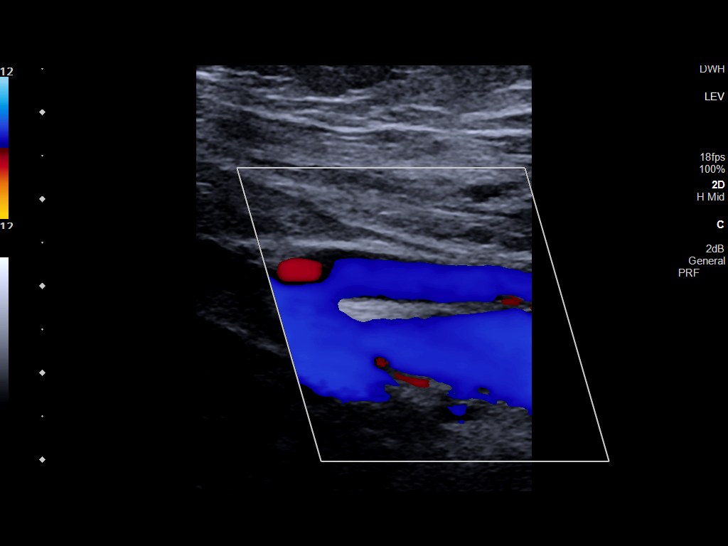
[im 20/51]
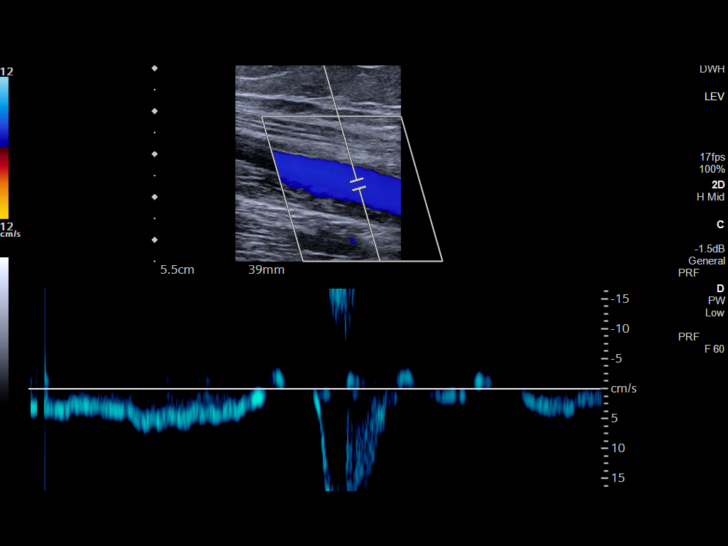
[im 24/51]
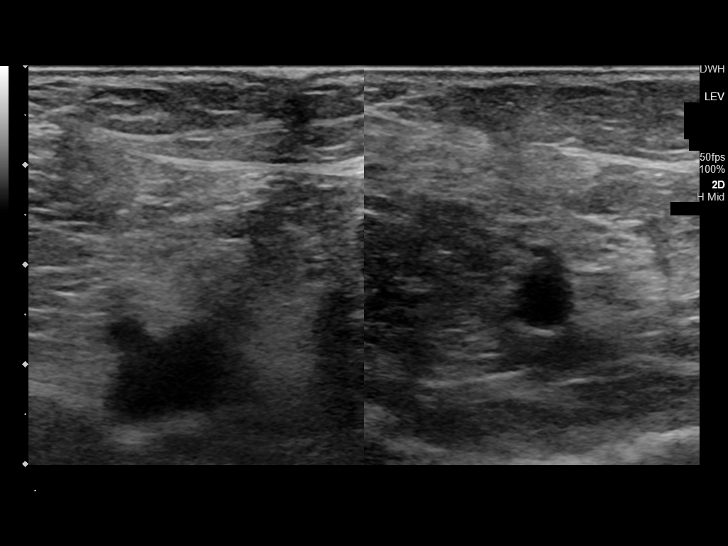
[im 29/51]
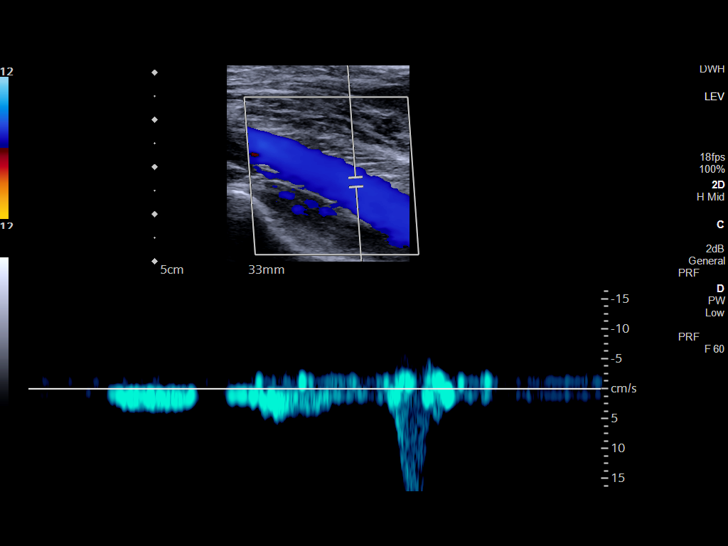
[im 31/51]
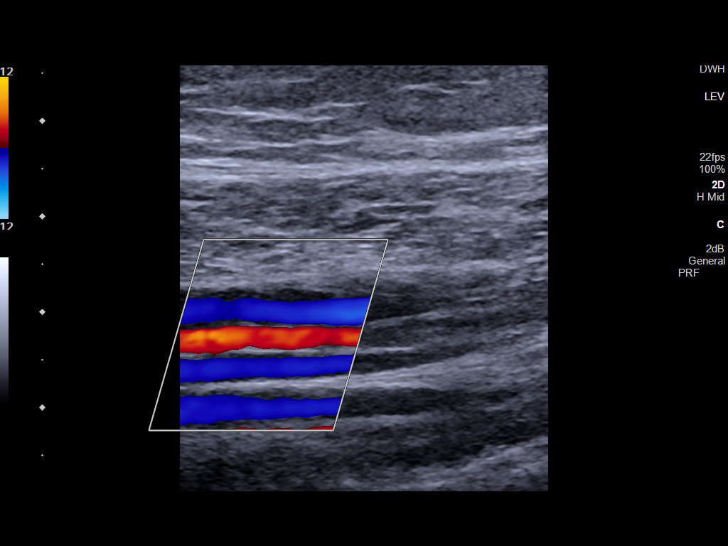
[im 35/51]
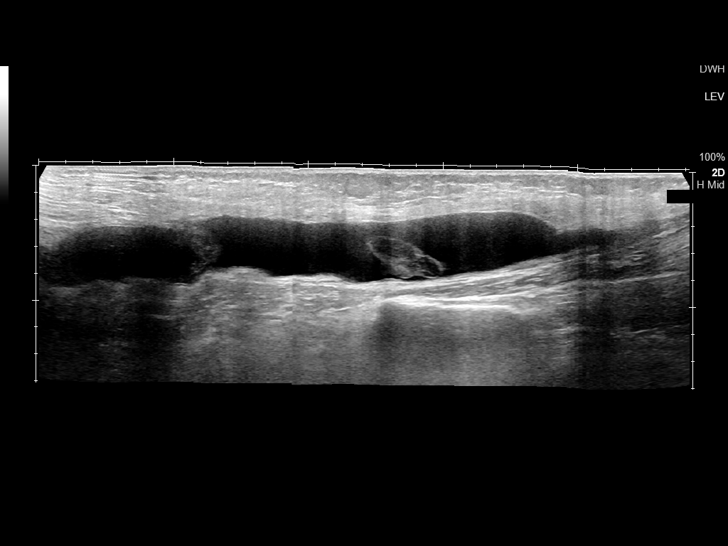
[im 40/51]
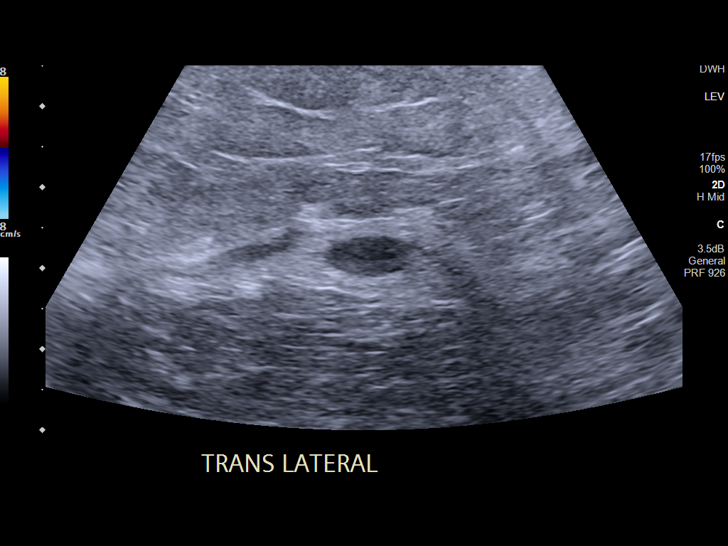
[im 44/51]
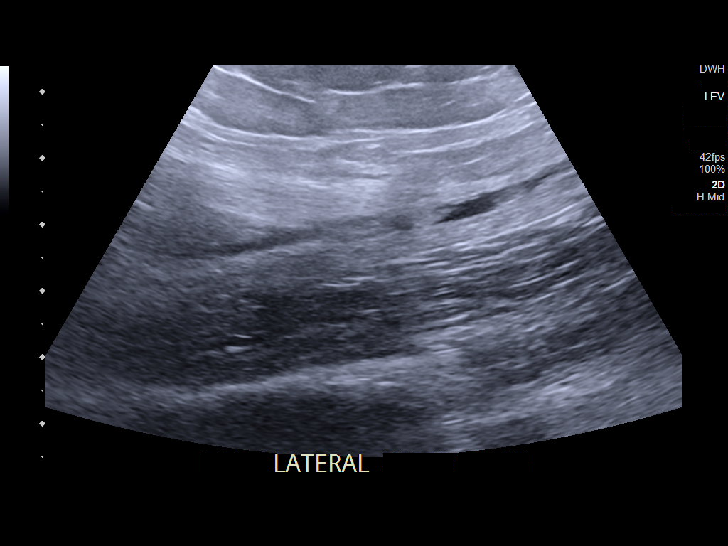
[im 46/51]
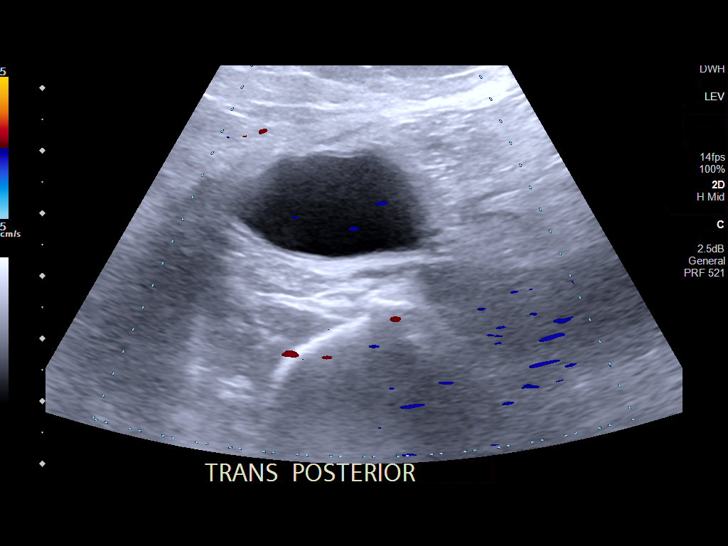
[im 51/51]
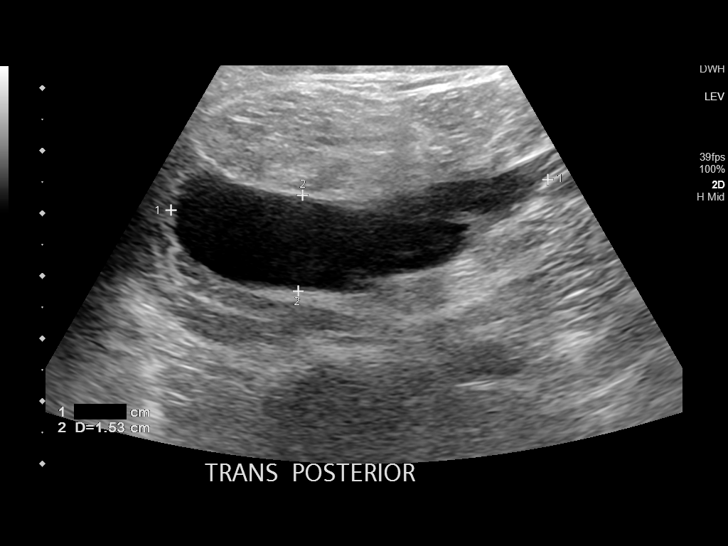

[13 of 24 positions shown; findings below may reference images not displayed]

FINDINGS: VENOUS

Normal compressibility of the common femoral, superficial femoral,
and popliteal veins, as well as the visualized calf veins.
Visualized portions of profunda femoral vein and great saphenous
vein unremarkable. No filling defects to suggest DVT on grayscale or
color Doppler imaging. Doppler waveforms show normal direction of
venous flow, normal respiratory plasticity and response to
augmentation.

Limited views of the contralateral common femoral vein are
unremarkable.

OTHER

There is a complex cystic area or collection with internal echogenic
debris in the posterior thigh extending to the popliteal region
measuring approximately 6 x 2 x 22 cm. Findings may represent an old
hematoma or a large complex Baker cyst. Clinical correlation is
recommended. CT or MRI may provide better evaluation if clinically
indicated and on a nonemergent basis.

Limitations: none
IMPRESSION: 1. No sonographic findings of DVT.
2. Large complex collection in the posterior thigh.

## 2021-02-09 IMAGING — MR MR HIP*L* WO/W CM
9 of 10 series · 38 of 40 positions shown · IV contrast (gadavist)
Comparison: CT pelvis from same day.

CLINICAL DATA: Severe left hip pain.  No injury.

EXAM:
MRI PELVIS AND LEFT HIP WITHOUT AND WITH CONTRAST
TECHNIQUE: Multiplanar multisequence MR imaging of the pelvis was performed
both before and after administration of intravenous contrast.
CONTRAST:  6mL GADAVIST GADOBUTROL 1 MMOL/ML IV SOLN

[Series 2: T1 · coronal · left · 4.0mm · 0.59mm/px · 2 of 38 slices shown]
[im 1/38]
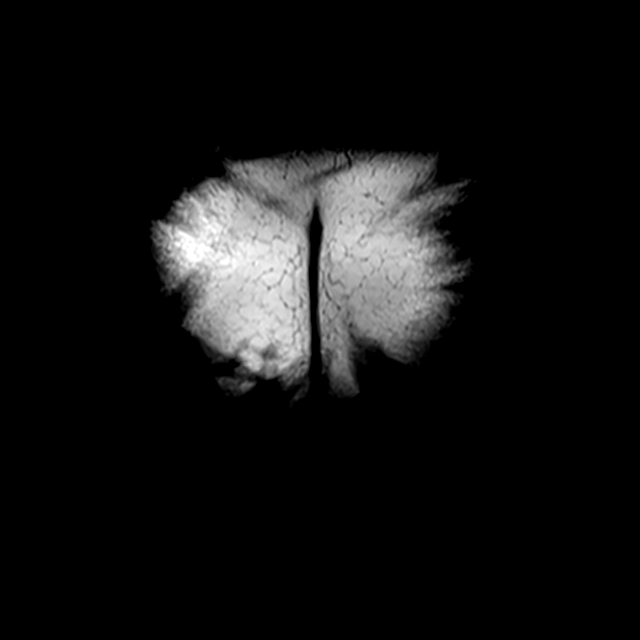
[im 38/38]
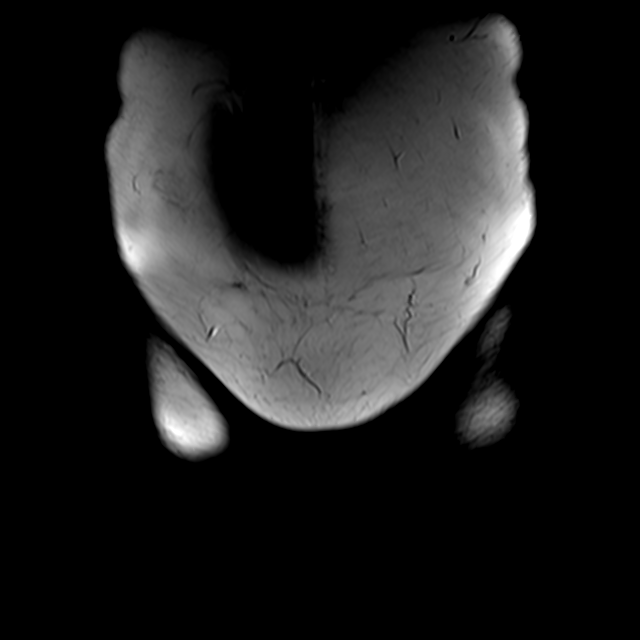

[Series 4: T2 fat-sat · axial · left · 4.0mm · 0.74mm/px · z∈[-143,+127]mm · 5 of 55 slices shown (1 of 2)]
[im 1/55]
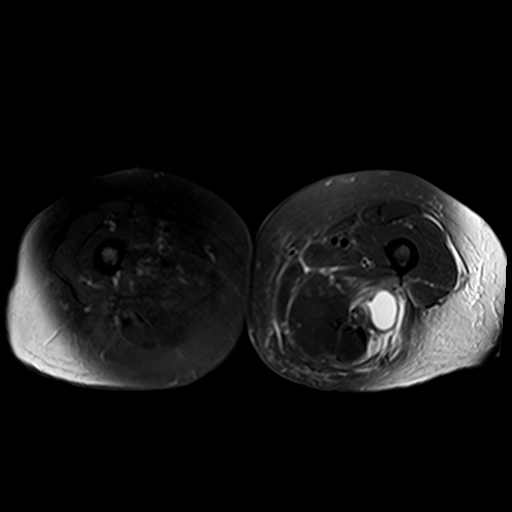
[im 14/55]
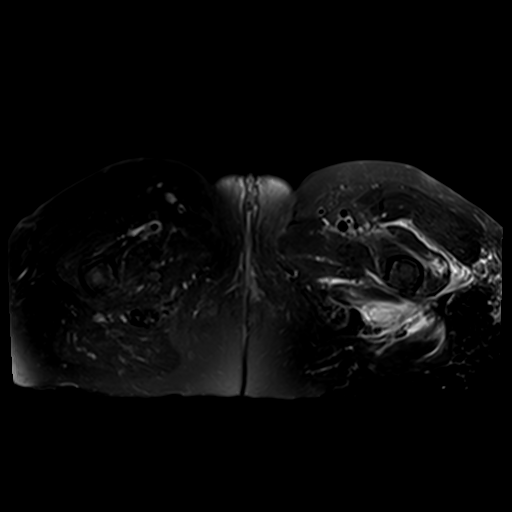
[im 28/55]
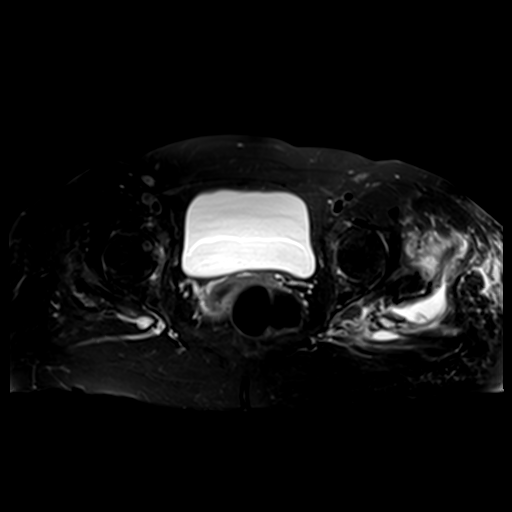
[im 41/55]
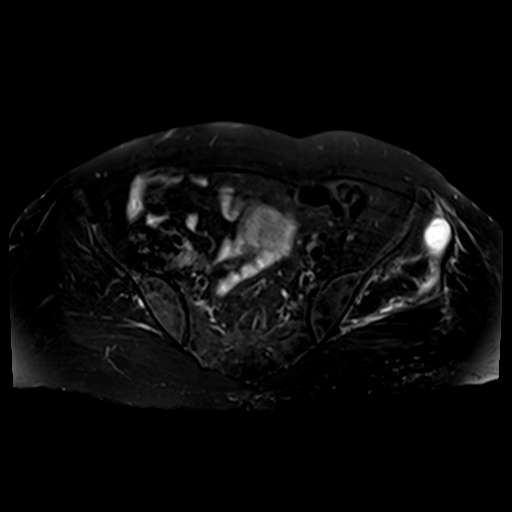
[im 55/55]
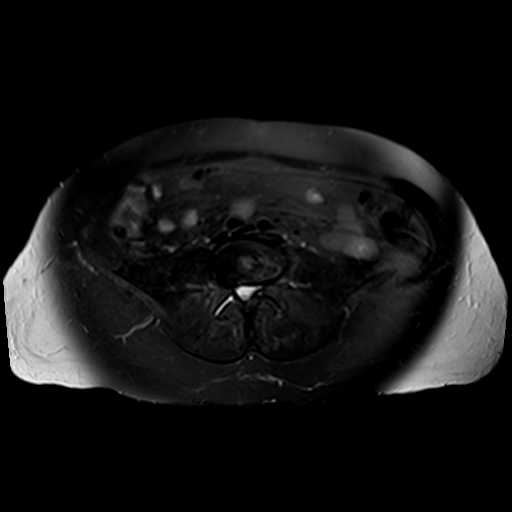

[Series 5: PD fat-sat · sagittal · left · 4.0mm · 0.86mm/px · 3 of 33 slices shown (1 of 2)]
[im 1/33]
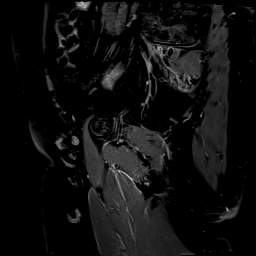
[im 17/33]
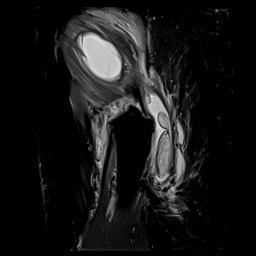
[im 33/33]
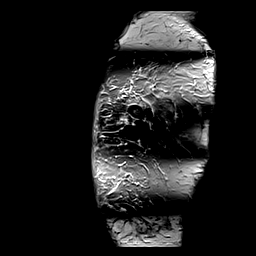

[Series 6: PD fat-sat · coronal · left · 4.0mm · 0.98mm/px · 3 of 33 slices shown (2 of 2)]
[im 1/33]
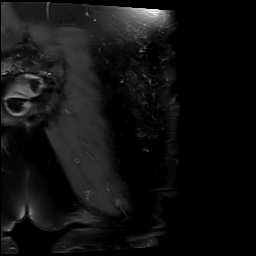
[im 17/33]
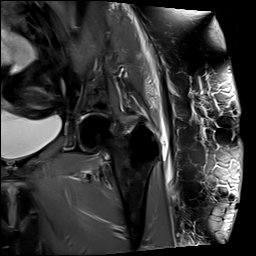
[im 33/33]
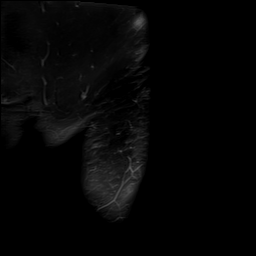

[Series 7: T2 fat-sat · sagittal · left · 4.0mm · 0.94mm/px · 7 of 80 slices shown (2 of 2)]
[im 1/80]
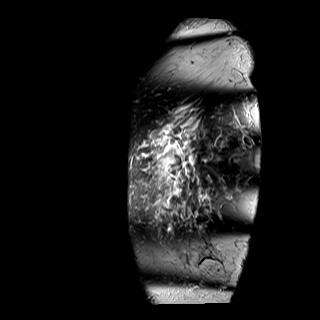
[im 14/80]
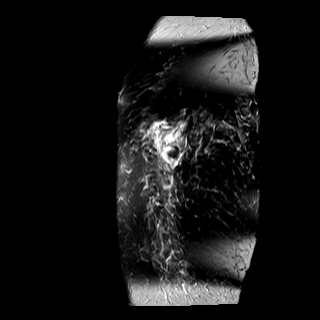
[im 27/80]
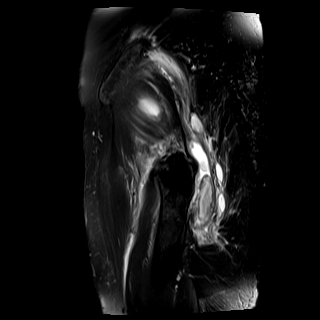
[im 40/80]
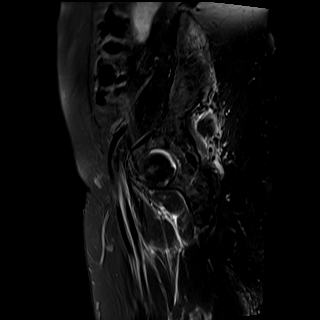
[im 53/80]
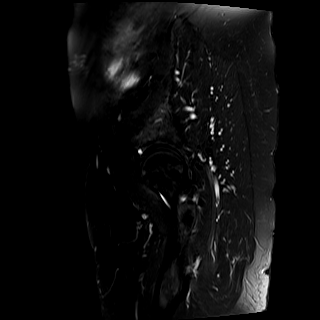
[im 66/80]
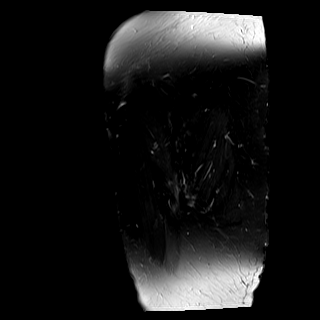
[im 80/80]
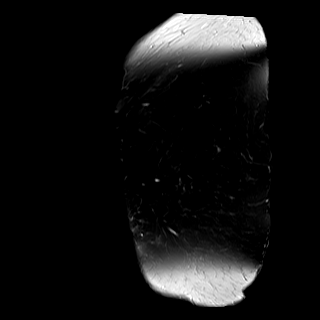

[Series 9: T1 fat-sat post-contrast · axial · non-contrast · left · 4.0mm · 1.48mm/px · z∈[-145,+130]mm · 5 of 56 slices shown (1 of 4)]
[im 1/56]
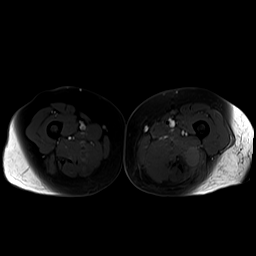
[im 14/56]
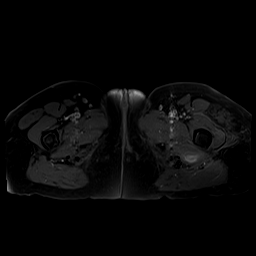
[im 28/56]
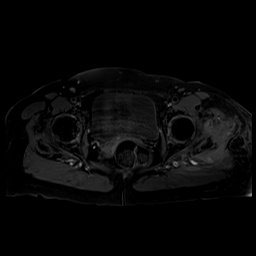
[im 42/56]
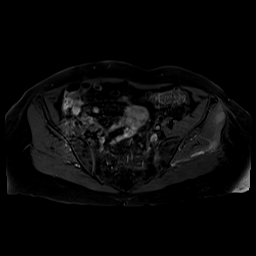
[im 56/56]
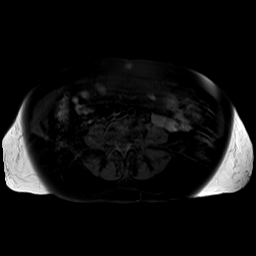

[Series 10: T1 fat-sat post-contrast · axial · left · 4.0mm · 1.48mm/px · z∈[-145,+130]mm · 5 of 56 slices shown (2 of 4)]
[im 1/56]
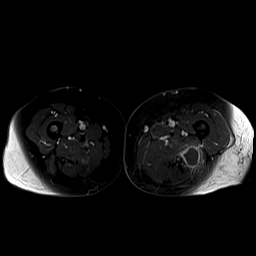
[im 14/56]
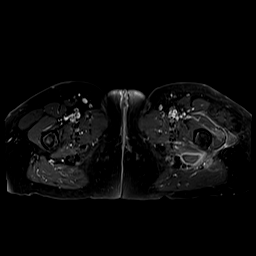
[im 28/56]
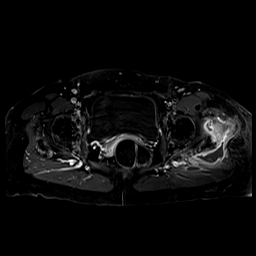
[im 42/56]
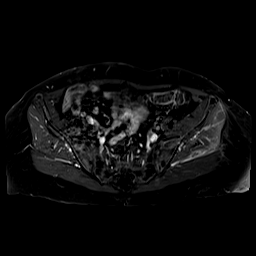
[im 56/56]
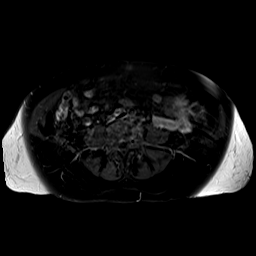

[Series 11: T1 fat-sat post-contrast · coronal · left · 4.0mm · 1.31mm/px · 3 of 40 slices shown (3 of 4)]
[im 1/40]
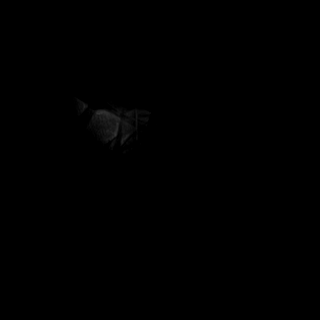
[im 20/40]
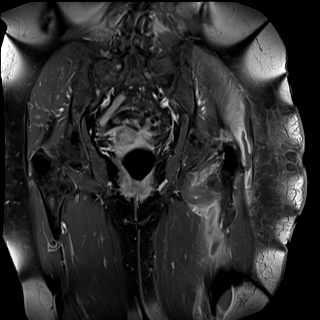
[im 40/40]
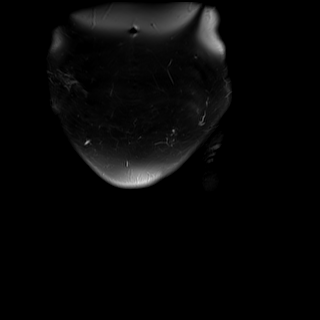

[Series 12: T1 fat-sat post-contrast · axial · left · 4.0mm · 0.86mm/px · z∈[-151,+124]mm · 5 of 56 slices shown (4 of 4)]
[im 1/56]
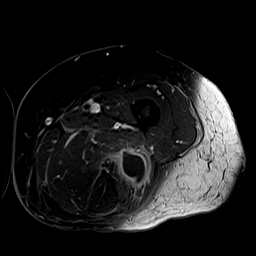
[im 14/56]
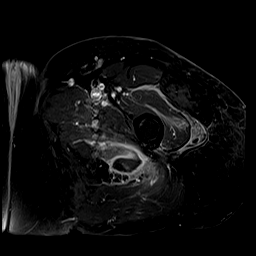
[im 28/56]
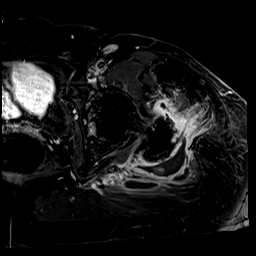
[im 42/56]
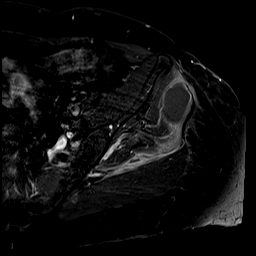
[im 56/56]
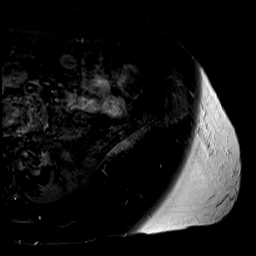

[38 of 40 positions shown; findings below may reference images not displayed]

FINDINGS: Bones: There is no evidence of acute fracture, dislocation or
avascular necrosis. No focal bone lesion. The visualized sacroiliac
joints and symphysis pubis appear normal.

Articular cartilage and labrum

Articular cartilage: Mild cartilage thinning in both hip joints with
tiny subchondral cystic changes in the both left anterior acetabular
roofs. No focal cartilage defect.

Labrum: Small tears of both anterior superior labrums. No paralabral
abnormality.

Joint or bursal effusion

Joint effusion: No significant hip joint effusion.

Bursae: Large amount of complex fluid within the left greater
trochanteric bursa, some of which is intrinsically T1 hyperintense.
Fluid dissects superiorly along the gluteus medius muscle and
inferiorly through the right ischiofemoral space and into the
posterior compartment of the thigh. This measures at least 24.7 cm
in CC dimension with the inferior margin not included in the field
of view. There is rim enhancement, but no solid internal
enhancement.

Muscles and tendons

Muscles and tendons: Partial tears of both gluteus minimus tendons.
Intact bilateral gluteus medius tendons. The bilateral hamstring and
iliopsoas tendons are intact. Reactive muscle edema in the left
gluteus, proximal vastus lateralis, and quadratus femoris muscles.
Bilateral gluteus minimus muscle atrophy.

Other findings

Miscellaneous: Small uterine fibroids. Left-sided colonic
diverticulosis.
IMPRESSION: 1. Severe left greater trochanteric hemorrhagic bursitis as
described above.
2. Partial tears of both gluteus minimus tendons, likely chronic
given associated muscle atrophy, although there may be a degree of
acuity on the left given adjacent bursitis.
3. Mild bilateral hip degenerative changes. Small tears of both
anterior superior labrums.

## 2021-02-09 IMAGING — MR MR PELVIS WO/W CM
8 of 10 series · 34 of 48 positions shown · IV contrast (6ml Gadavist)
Comparison: CT pelvis from same day.

CLINICAL DATA: Severe left hip pain.  No injury.

EXAM:
MRI PELVIS AND LEFT HIP WITHOUT AND WITH CONTRAST
TECHNIQUE: Multiplanar multisequence MR imaging of the pelvis was performed
both before and after administration of intravenous contrast.
CONTRAST:  6mL GADAVIST GADOBUTROL 1 MMOL/ML IV SOLN

[Series 2: T1 · coronal · left · 4.0mm · 0.59mm/px · 3 of 38 slices shown]
[im 1/38]
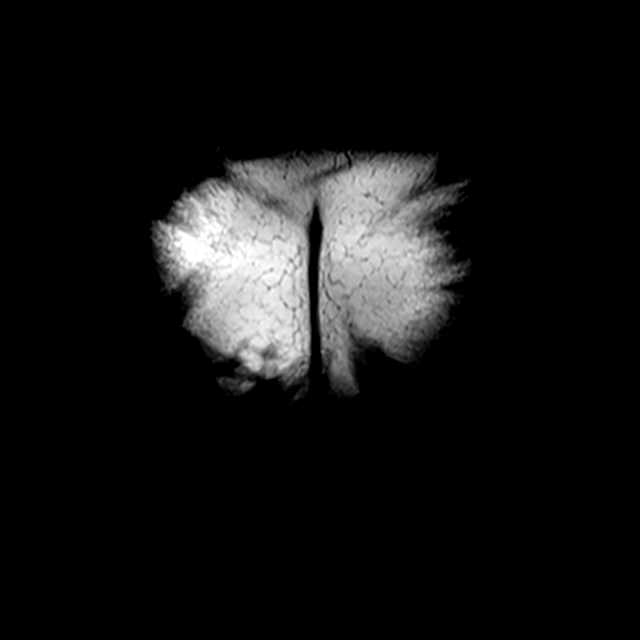
[im 19/38]
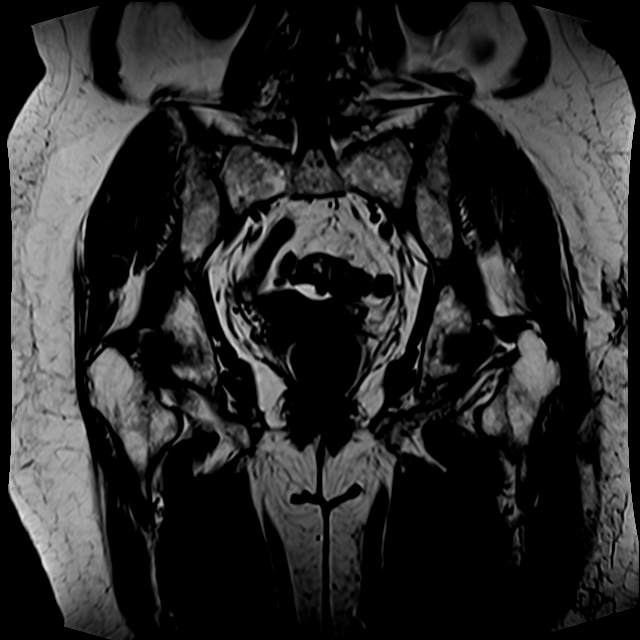
[im 38/38]
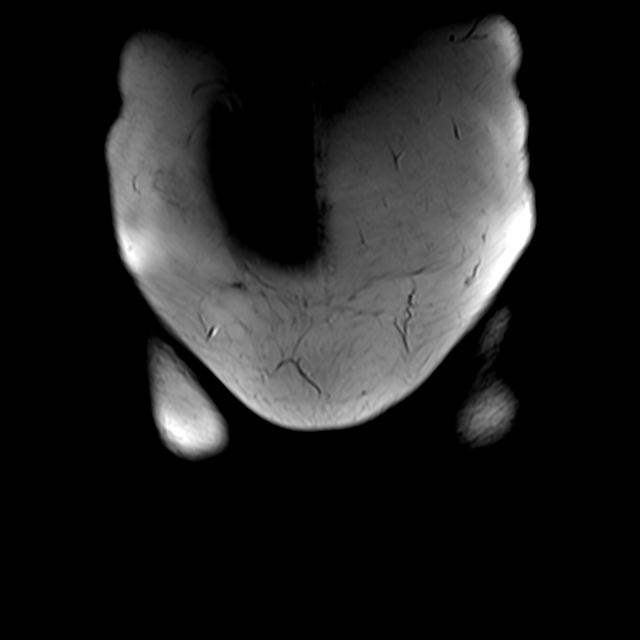

[Series 3: STIR · coronal · left · 4.0mm · 1.19mm/px · 4 of 38 slices shown]
[im 1/38]
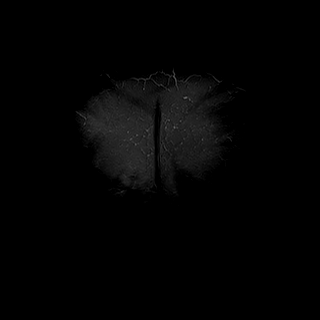
[im 13/38]
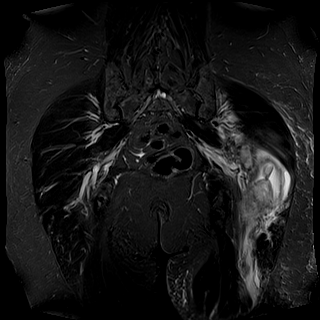
[im 25/38]
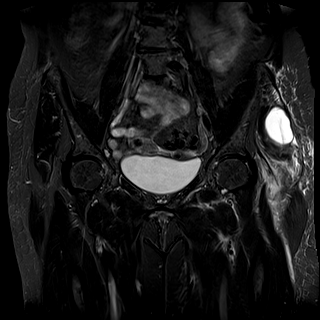
[im 38/38]
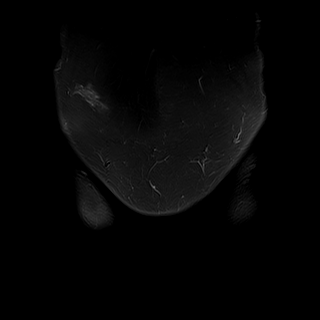

[Series 4: T2 fat-sat · axial · left · 4.0mm · 0.74mm/px · z∈[-143,+127]mm · 5 of 55 slices shown (1 of 2)]
[im 1/55]
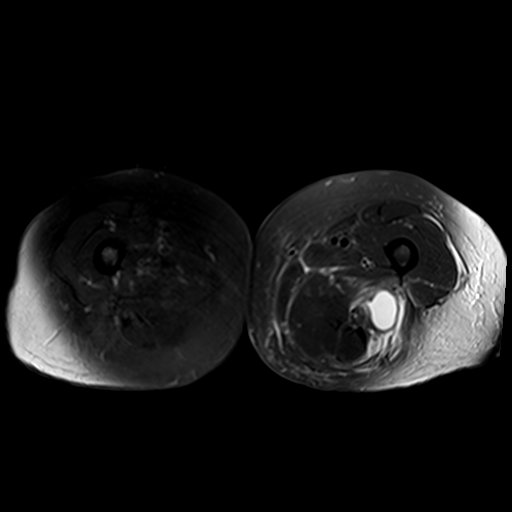
[im 14/55]
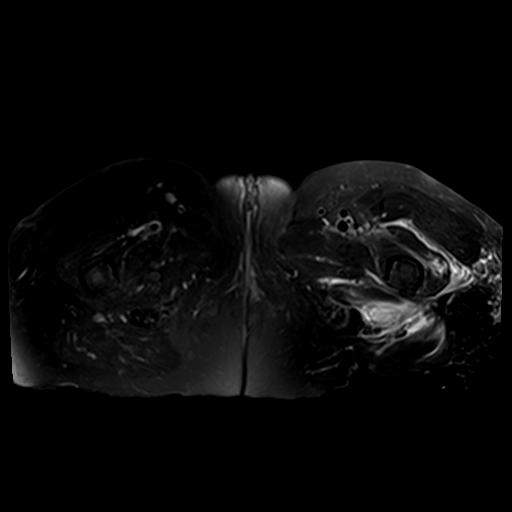
[im 28/55]
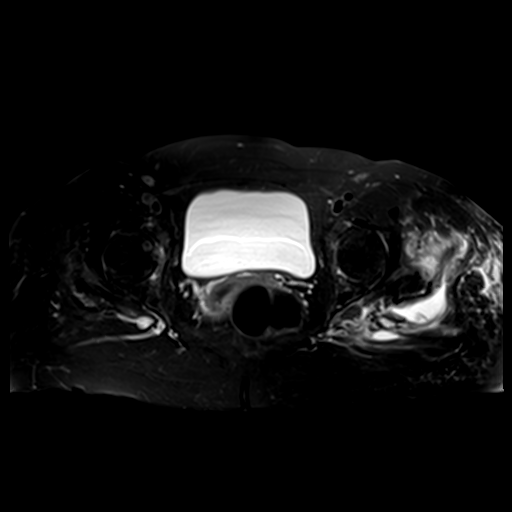
[im 41/55]
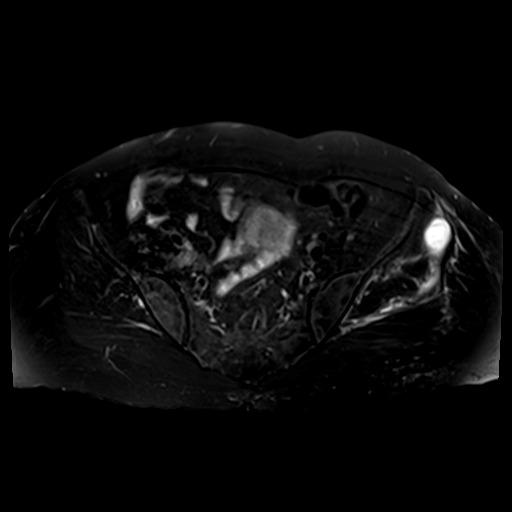
[im 55/55]
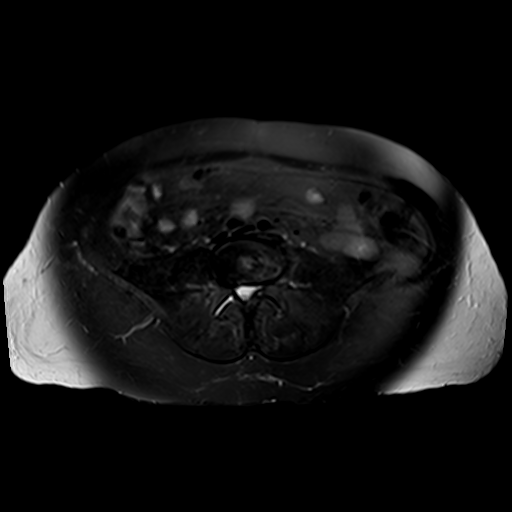

[Series 5: PD fat-sat · sagittal · left · 4.0mm · 0.86mm/px · 3 of 33 slices shown (1 of 2)]
[im 1/33]
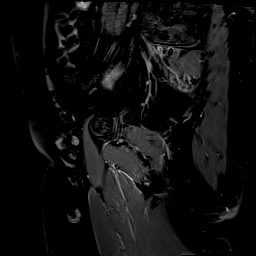
[im 17/33]
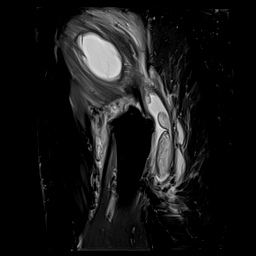
[im 33/33]
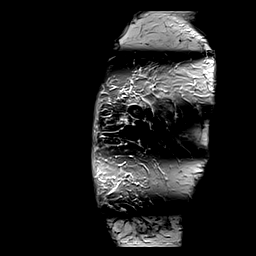

[Series 6: PD fat-sat · coronal · left · 4.0mm · 0.98mm/px · 3 of 33 slices shown (2 of 2)]
[im 1/33]
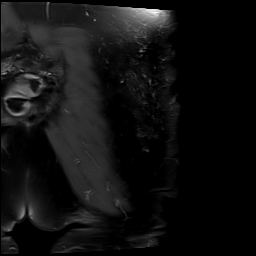
[im 17/33]
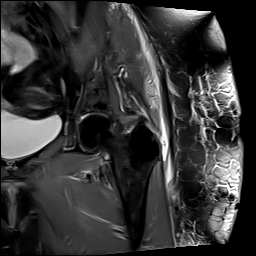
[im 33/33]
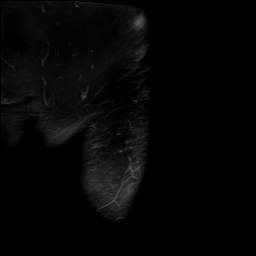

[Series 7: T2 fat-sat · sagittal · left · 4.0mm · 0.94mm/px · 8 of 80 slices shown (2 of 2)]
[im 1/80]
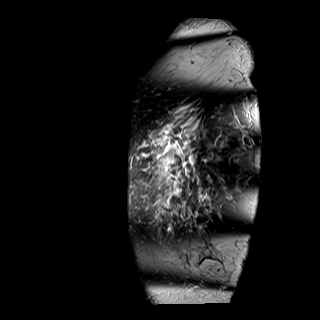
[im 12/80]
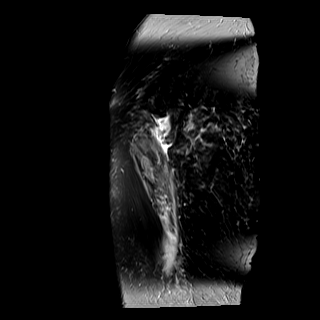
[im 23/80]
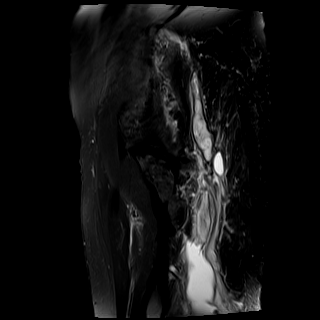
[im 34/80]
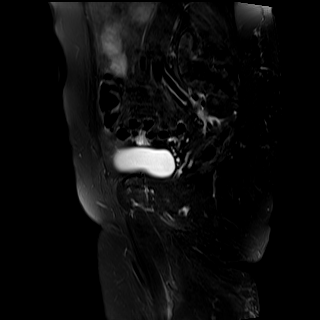
[im 46/80]
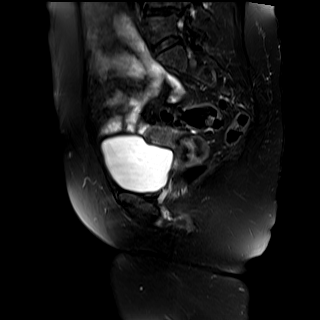
[im 57/80]
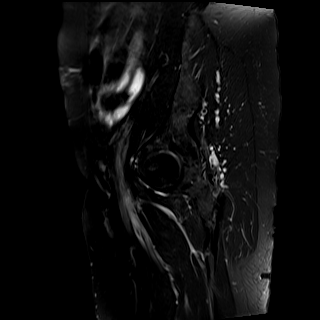
[im 68/80]
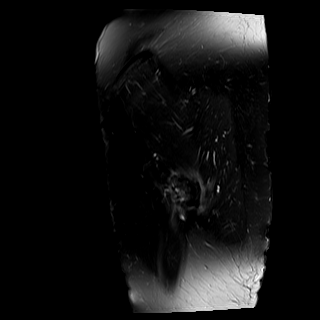
[im 80/80]
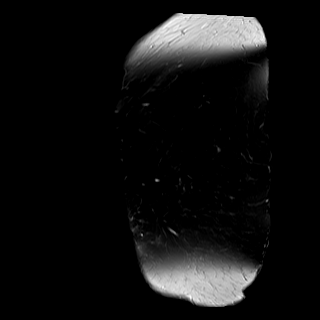

[Series 9: T1 fat-sat post-contrast · axial · non-contrast · left · 4.0mm · 1.48mm/px · z∈[-145,+130]mm · 6 of 56 slices shown (1 of 2)]
[im 1/56]
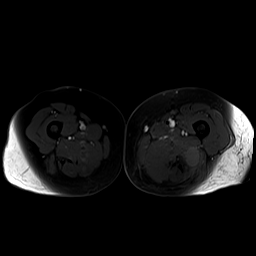
[im 12/56]
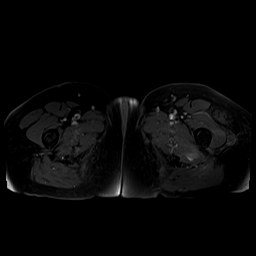
[im 23/56]
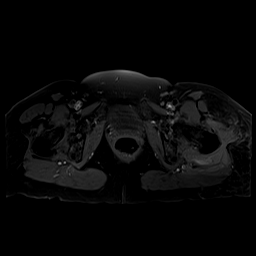
[im 34/56]
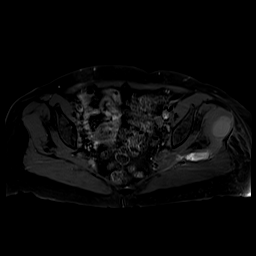
[im 45/56]
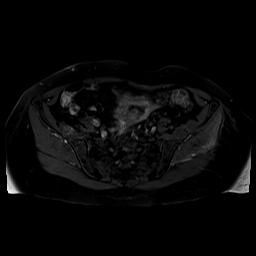
[im 56/56]
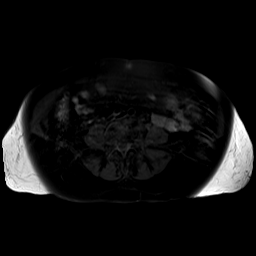

[Series 10: T1 fat-sat post-contrast · axial · left · 4.0mm · 1.48mm/px · z∈[-145,-90]mm · 2 of 56 slices shown (2 of 2)]
[im 1/56]
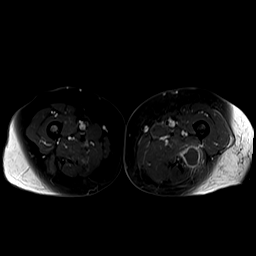
[im 12/56]
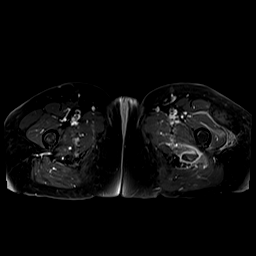

[34 of 48 positions shown; findings below may reference images not displayed]

FINDINGS: Bones: There is no evidence of acute fracture, dislocation or
avascular necrosis. No focal bone lesion. The visualized sacroiliac
joints and symphysis pubis appear normal.

Articular cartilage and labrum

Articular cartilage: Mild cartilage thinning in both hip joints with
tiny subchondral cystic changes in the both left anterior acetabular
roofs. No focal cartilage defect.

Labrum: Small tears of both anterior superior labrums. No paralabral
abnormality.

Joint or bursal effusion

Joint effusion: No significant hip joint effusion.

Bursae: Large amount of complex fluid within the left greater
trochanteric bursa, some of which is intrinsically T1 hyperintense.
Fluid dissects superiorly along the gluteus medius muscle and
inferiorly through the right ischiofemoral space and into the
posterior compartment of the thigh. This measures at least 24.7 cm
in CC dimension with the inferior margin not included in the field
of view. There is rim enhancement, but no solid internal
enhancement.

Muscles and tendons

Muscles and tendons: Partial tears of both gluteus minimus tendons.
Intact bilateral gluteus medius tendons. The bilateral hamstring and
iliopsoas tendons are intact. Reactive muscle edema in the left
gluteus, proximal vastus lateralis, and quadratus femoris muscles.
Bilateral gluteus minimus muscle atrophy.

Other findings

Miscellaneous: Small uterine fibroids. Left-sided colonic
diverticulosis.
IMPRESSION: 1. Severe left greater trochanteric hemorrhagic bursitis as
described above.
2. Partial tears of both gluteus minimus tendons, likely chronic
given associated muscle atrophy, although there may be a degree of
acuity on the left given adjacent bursitis.
3. Mild bilateral hip degenerative changes. Small tears of both
anterior superior labrums.

## 2021-02-09 IMAGING — CT CT PELVIS W/ CM
2 of 3 series · 16 of 46 positions shown, 18 images · IV contrast (APPLIED)
Comparison: None.

CLINICAL DATA: Severe left hip pain, atraumatic

EXAM:
CT PELVIS WITH CONTRAST
TECHNIQUE: Multidetector CT imaging of the pelvis was performed using the
standard protocol following the bolus administration of intravenous
contrast.
CONTRAST:  100mL OMNIPAQUE IOHEXOL 300 MG/ML  SOLN

[Series 2: routine abd/pel with · axial · 0.86mm/px · z∈[+82,+312]mm · 13 of 54 slices shown, 15 images]
[im 4/54  soft-tissue]
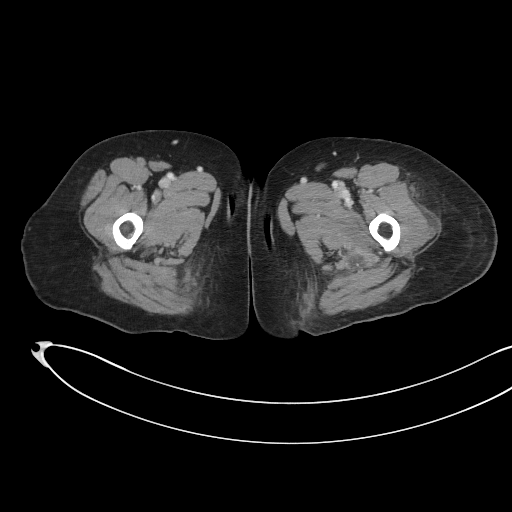
[im 4/54  bone]
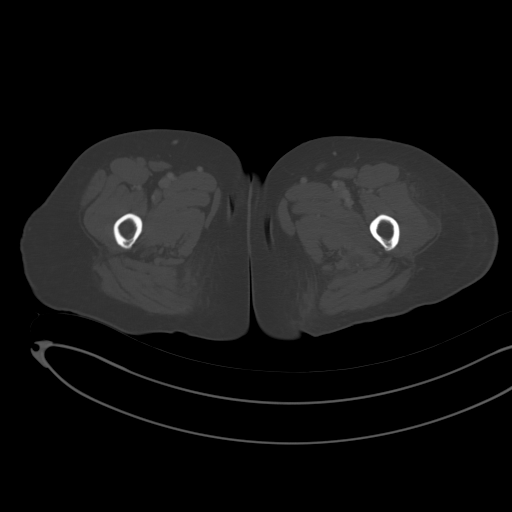
[im 7/54  soft-tissue]
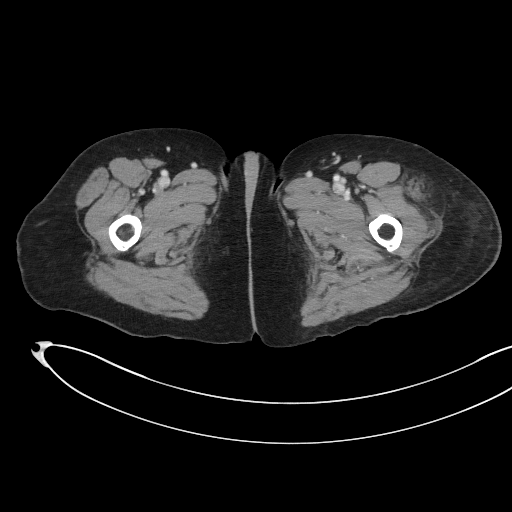
[im 11/54  soft-tissue]
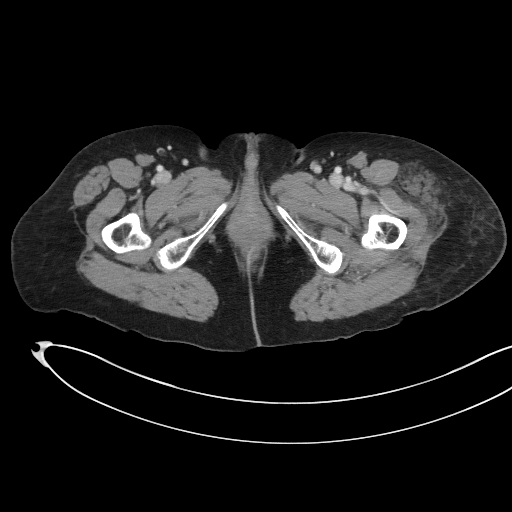
[im 16/54  soft-tissue]
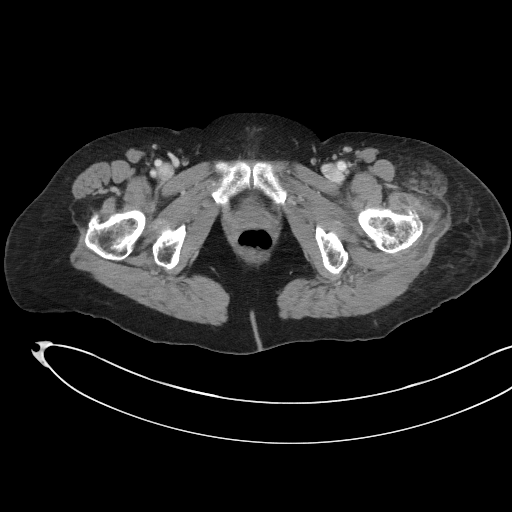
[im 19/54  soft-tissue]
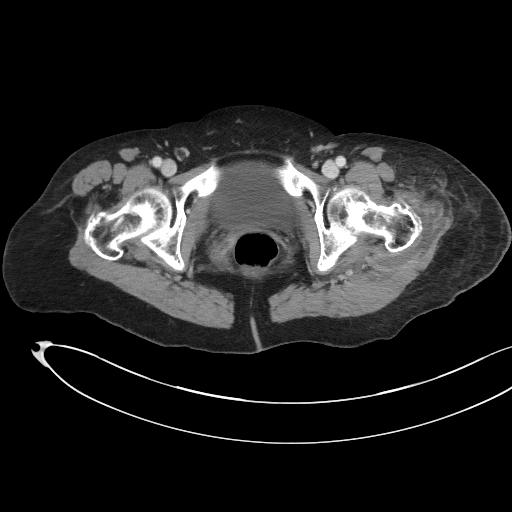
[im 23/54  soft-tissue]
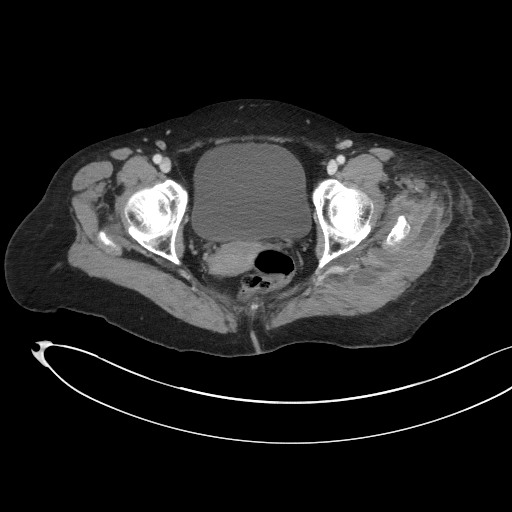
[im 28/54  soft-tissue]
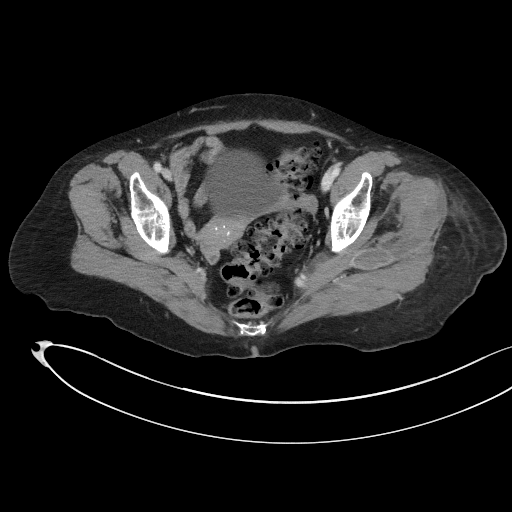
[im 31/54  soft-tissue]
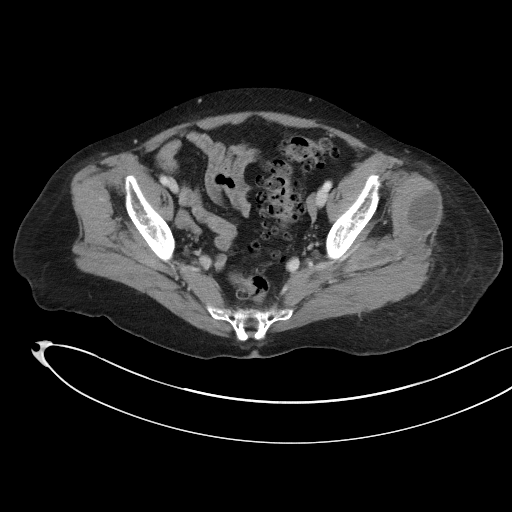
[im 35/54  soft-tissue]
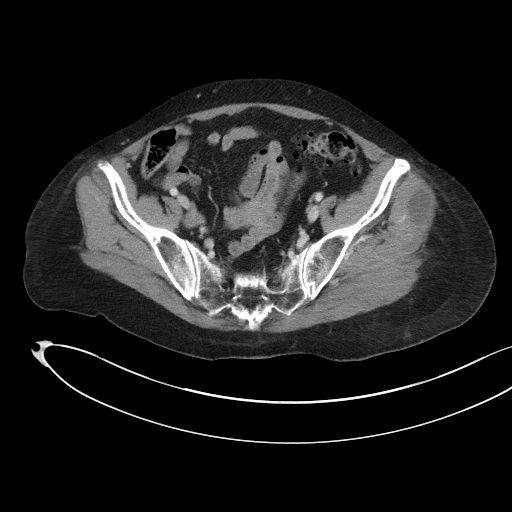
[im 35/54  bone]
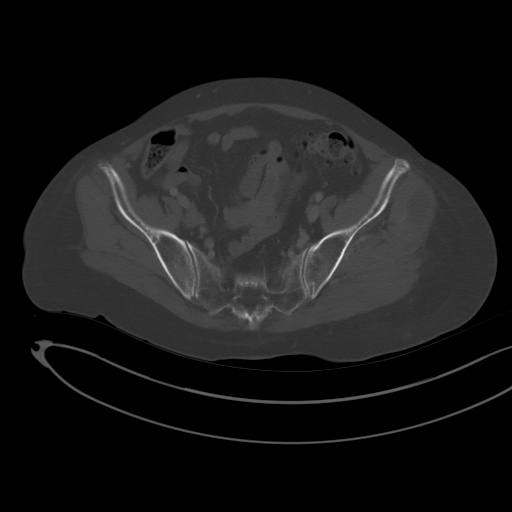
[im 38/54  soft-tissue]
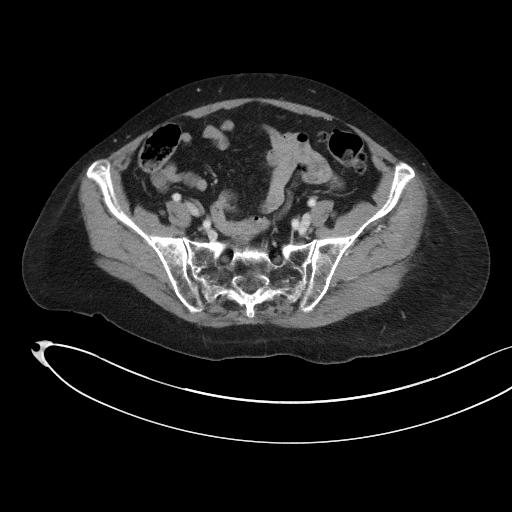
[im 43/54  soft-tissue]
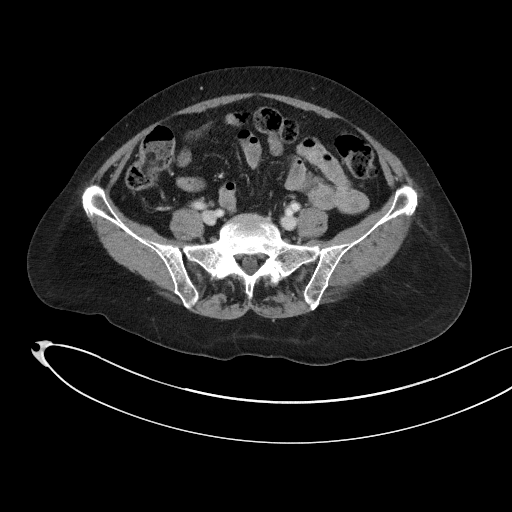
[im 47/54  soft-tissue]
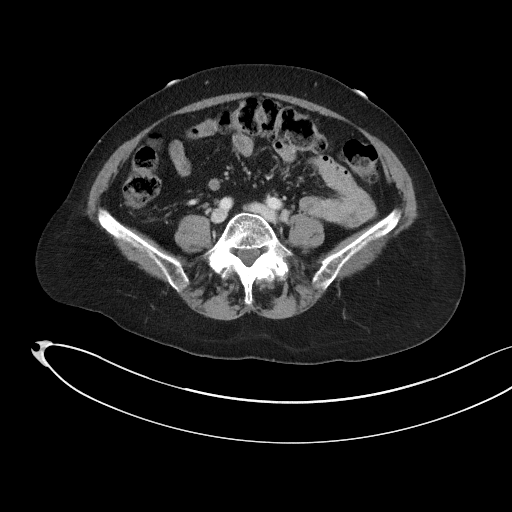
[im 50/54  soft-tissue]
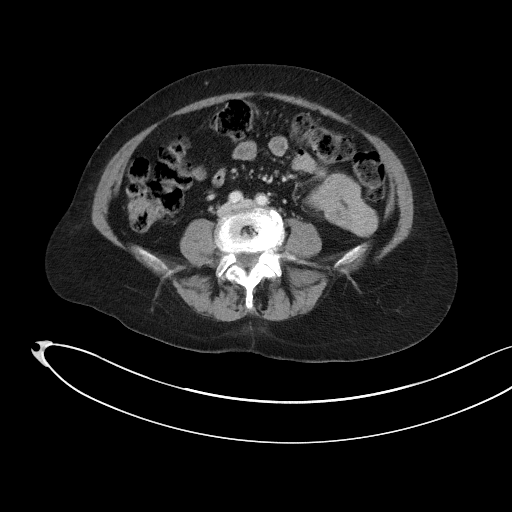

[Series 4: coronal st · coronal · 0.53mm/px · 3 of 79 slices shown]
[im 27/79  soft-tissue]
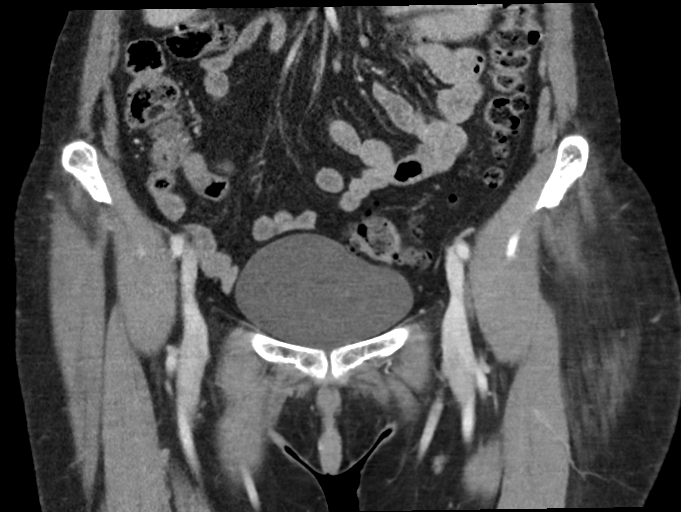
[im 35/79  soft-tissue]
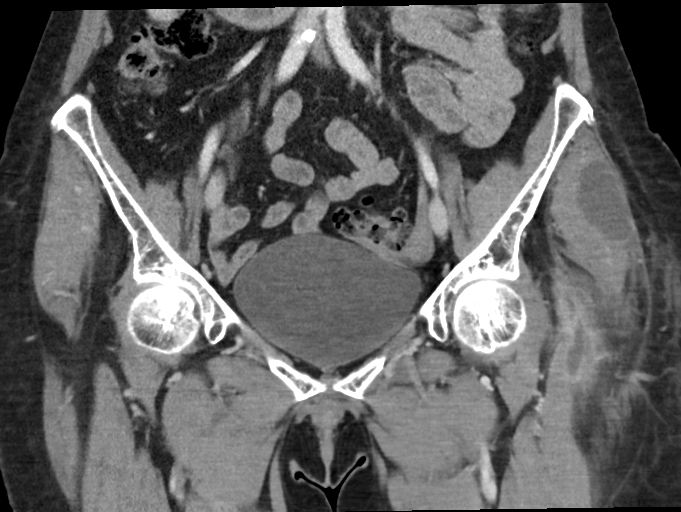
[im 44/79  soft-tissue]
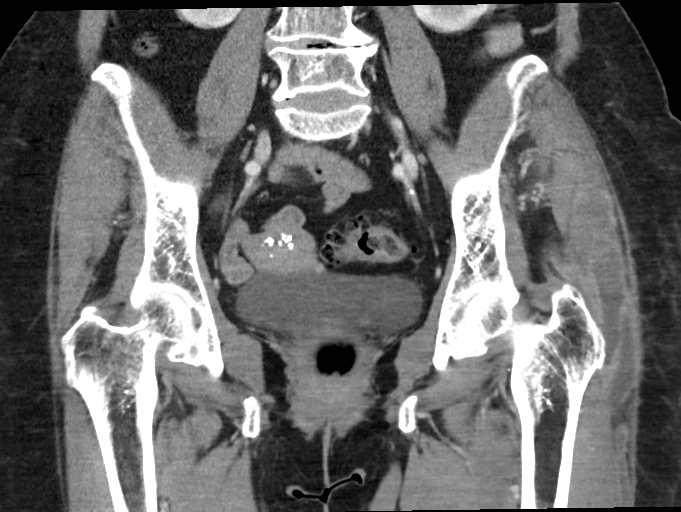

[16 of 46 positions shown; findings below may reference images not displayed]

FINDINGS: Urinary Tract:  Bladder is within normal limits.

Bowel:  Sigmoid diverticulosis, without evidence of diverticulitis.

Vascular/Lymphatic: No evidence of aneurysm.

No suspicious pelvic lymphadenopathy.

Reproductive: Suspected calcified fundal fibroids. Uterus is
otherwise unremarkable.

Bilateral ovaries are within normal limits.

Other:  No pelvic ascites.

Musculoskeletal: Mild degenerative changes at L4-5.

No fracture is seen.  Bilateral hip joint spaces are preserved.

Multiloculated fluid collection along the left greater trochanteric
bursa (series 2/image 34), involving the gluteus minimus (series
2/image 22), gluteus medius (series 2/image 29), and gluteus maximus
musculature (series 2/image 33). Collection measures approximately
2.4 x 9.5 x 11.9 cm in aggregate (series 2/image 31; coronal image
43). Some of these collections demonstrate mild rim enhancement
(series 2/image 27). Overlying mild subcutaneous stranding (series
2/image 30). In the absence of trauma, this appearance favors
greater trochanteric bursitis.
IMPRESSION: Suspected left greater trochanteric bursitis, as above.

## 2021-02-09 MED ORDER — ONDANSETRON HCL 4 MG PO TABS: 4.0000 mg | ORAL_TABLET | Freq: Four times a day (QID) | ORAL | Status: DC | PRN

## 2021-02-09 MED ORDER — SODIUM CHLORIDE 0.9 % IV BOLUS
1000.0000 mL | Freq: Once | INTRAVENOUS | Status: AC
  Administered 2021-02-09: 1000 mL via INTRAVENOUS

## 2021-02-09 MED ORDER — MAGNESIUM HYDROXIDE 400 MG/5ML PO SUSP: 30.0000 mL | Freq: Every day | ORAL | Status: DC | PRN

## 2021-02-09 MED ORDER — TRAZODONE HCL 50 MG PO TABS: 25.0000 mg | ORAL_TABLET | Freq: Every evening | ORAL | Status: DC | PRN

## 2021-02-09 MED ORDER — ACETAMINOPHEN 650 MG RE SUPP: 650.0000 mg | Freq: Four times a day (QID) | RECTAL | Status: DC | PRN

## 2021-02-09 MED ORDER — ONDANSETRON HCL 4 MG/2ML IJ SOLN: 4.0000 mg | Freq: Four times a day (QID) | INTRAMUSCULAR | Status: DC | PRN

## 2021-02-09 MED ORDER — GADOBUTROL 1 MMOL/ML IV SOLN
6.0000 mL | Freq: Once | INTRAVENOUS | Status: AC | PRN
  Administered 2021-02-09: 6 mL via INTRAVENOUS
  Filled 2021-02-09: qty 6

## 2021-02-09 MED ORDER — ENOXAPARIN SODIUM 40 MG/0.4ML IJ SOSY
40.0000 mg | PREFILLED_SYRINGE | INTRAMUSCULAR | Status: DC
  Administered 2021-02-09: 40 mg via SUBCUTANEOUS
  Filled 2021-02-09: qty 0.4

## 2021-02-09 MED ORDER — FLUOXETINE HCL 10 MG PO CAPS
10.0000 mg | ORAL_CAPSULE | Freq: Every day | ORAL | Status: DC
  Administered 2021-02-09 – 2021-02-10 (×2): 10 mg via ORAL
  Filled 2021-02-09 (×3): qty 1

## 2021-02-09 MED ORDER — VITAMIN D 25 MCG (1000 UNIT) PO TABS
1000.0000 [IU] | ORAL_TABLET | Freq: Every day | ORAL | Status: DC
Start: 2021-02-09 — End: 2021-02-10
  Administered 2021-02-09 – 2021-02-10 (×2): 1000 [IU] via ORAL
  Filled 2021-02-09 (×2): qty 1

## 2021-02-09 MED ORDER — SODIUM CHLORIDE 0.9 % IV SOLN
1.0000 g | INTRAVENOUS | Status: DC
  Administered 2021-02-09 – 2021-02-10 (×2): 1 g via INTRAVENOUS
  Filled 2021-02-09 (×5): qty 10

## 2021-02-09 MED ORDER — SODIUM CHLORIDE 0.9 % IV SOLN: INTRAVENOUS | Status: DC

## 2021-02-09 MED ORDER — VITAMIN B-12 1000 MCG PO TABS: 500.0000 ug | ORAL_TABLET | Freq: Every day | ORAL | Status: DC

## 2021-02-09 MED ORDER — HYDROCODONE-ACETAMINOPHEN 5-325 MG PO TABS
1.0000 | ORAL_TABLET | Freq: Four times a day (QID) | ORAL | Status: DC | PRN
  Administered 2021-02-09: 1 via ORAL
  Filled 2021-02-09: qty 1

## 2021-02-09 MED ORDER — IOHEXOL 300 MG/ML  SOLN
100.0000 mL | Freq: Once | INTRAMUSCULAR | Status: AC | PRN
  Administered 2021-02-09: 100 mL via INTRAVENOUS

## 2021-02-09 MED ORDER — LORATADINE 10 MG PO TABS
10.0000 mg | ORAL_TABLET | Freq: Every day | ORAL | Status: DC
  Administered 2021-02-09 – 2021-02-10 (×2): 10 mg via ORAL
  Filled 2021-02-09 (×2): qty 1

## 2021-02-09 MED ORDER — ACETAMINOPHEN 325 MG PO TABS: 650.0000 mg | ORAL_TABLET | Freq: Four times a day (QID) | ORAL | Status: DC | PRN

## 2021-02-09 MED ORDER — CYANOCOBALAMIN 1000 MCG/ML IJ SOLN
1000.0000 ug | Freq: Every day | INTRAMUSCULAR | Status: DC
  Administered 2021-02-09 – 2021-02-10 (×2): 1000 ug via INTRAMUSCULAR
  Filled 2021-02-09 (×2): qty 1

## 2021-02-09 MED ORDER — ATORVASTATIN CALCIUM 10 MG PO TABS
10.0000 mg | ORAL_TABLET | Freq: Every day | ORAL | Status: DC
  Administered 2021-02-09 – 2021-02-10 (×2): 10 mg via ORAL
  Filled 2021-02-09 (×2): qty 1

## 2021-02-09 NOTE — ED Notes (Signed)
Assisted pt to the restroom 

## 2021-02-09 NOTE — Progress Notes (Signed)
[facility] Progress Note  Patient: Olivia Alvarado    OXB:[id number]  DOA: 02/08/2021     Date of Service: the patient was seen and examined on 02/09/2021  Chief Complaint  Patient presents with   Leg Pain   Brief hospital course: Olivia Alvarado is a 70 y.o. Caucasian female with medical history significant for dyslipidemia, depression, low back pain and irritable bowel syndrome, who presented to the ER with acute onset of left leg pain.  About a week ago he was seen in the ED after having left proximal leg atraumatic bruising and was discharged home for negative work-up.  He had outpatient blood work with his PCP on a follow-up on 11/4 and his sodium level was 141.  In the ER tonight she experienced worsening left thigh pain and difficulty ambulation due to pain especially on the lateral side.  The bruising itself has been improving per her report but descended to her left ankle and left lateral hip pain has been increasing with radiation to the left knee and lateral left thigh.  No fever or chills.  She denies any falls or trauma or paresthesias or focal muscle weakness.  No nausea or vomiting or abdominal pain.  No dysuria, oliguria or hematuria urgency or frequency or flank pain..  No other bleeding diathesis.    ED Course: In the ER blood pressure was 141/72 with otherwise normal vitals.  Labs revealed hyponatremia 120 1 repeat levels the same.  Chloride was 95 and later 102 CBC showed anemia better than previous levels.  UA was positive for UTI. EKG as reviewed by me : EKG showed normal sinus rhythm with a rate of 84 with T wave inversion laterally Imaging: Pelvic CT with contrast revealed suspected left greater trochanteric bursitis.  The patient was given 1 L bolus of IV normal saline and will be given IV Rocephin for UTI.  She will be admitted to a medical bed for further evaluation and management    Assessment and Plan: Principal problem: Hemorrhagic greater trochanteric  bursitis, left Active problem: Hyponatremia    # Hyponatremia, resolved after IV fluid - s/p hydrated with IV normal saline. - Serum osmolality 295, sats within normal range  # Hemorrhagic left greater trochanteric bursitis. - Pain management will be provided. - She will be placed on  prn IV Toradol. Apply ice pack to decrease hematoma - Orthopedic consultated MRI reviewed  # UTI. - The patient will be placed on IV Rocephin and will follow urine culture and sensitivity.  # Dyslipidemia. - We will continue statin therapy  #  Depression. - We will continue Prozac.  #  Vitamin D deficiency. - Continue vitamin D.  # Anemia hemoglobin 9.8, iron and folate within normal range. # vitamin B12 level 233, target >400, started vitamin B12 1000 mcg IM injection followed by oral supplement.  Follow with PCP to repeat vitamin B12 level after 3 months.   Body mass index is 25.06 kg/m.  Interventions:       Diet: Heart healthy DVT Prophylaxis: SCD, pharmacological prophylaxis contraindicated due to hemorrhagic bursitis    Advance goals of care discussion: Full code  Family Communication: family was NOT present at bedside, at the time of interview.  The pt provided permission to discuss medical plan with the family. Opportunity was given to ask question and all questions were answered satisfactorily.   Disposition:  Pt is from Home, admitted with severe left hip pain and difficulty ambulation, still has left hip pain,  which precludes a safe discharge. Discharge to home, awaiting for PT and OT eval, when able and cleared by orthopedic, may require 1 to 2 days.  Subjective: No significant events overnight, patient was admitted due to pain in the left hip and found to have hemorrhagic left trochanteric bursitis, patient is still having significant pain, pending orthopedic evaluation. Patient denied any other active issues  Physical Exam: General:  alert oriented to time, place, and  person.  Appear in mild distress, affect appropriate Eyes: PERRLA ENT: Oral Mucosa Clear, moist  Neck: no JVD,  Cardiovascular: S1 and S2 Present, no Murmur,  Respiratory: good respiratory effort, Bilateral Air entry equal and Decreased, no Crackles, no wheezes Abdomen: Bowel Sound present, Soft and no tenderness,  Skin: no rashes Extremities: no Pedal edema, no calf tenderness, left hip swelling and tenderness, no visible bruise or hematoma Neurologic: without any new focal findings Gait not checked due to patient safety concerns  Vitals:   02/09/21 0933 02/09/21 1252 02/09/21 1300 02/09/21 1430  BP: (!) 128/104 129/63 108/79 114/60  Pulse: 85 78 75 83  Resp: 16 16  16   Temp:      TempSrc:      SpO2: 98% 96% 95% 96%  Weight:      Height:        Intake/Output Summary (Last 24 hours) at 02/09/2021 1522 Last data filed at 02/09/2021 0531 Gross per 24 hour  Intake 716.05 ml  Output --  Net 716.05 ml   Filed Weights   02/08/21 1915  Weight: 62.1 kg    Data Reviewed: I have personally reviewed and interpreted daily labs, tele strips, imagings as discussed above. I reviewed all nursing notes, pharmacy notes, vitals, pertinent old records I have discussed plan of care as described above with RN and patient/family.  CBC: Recent Labs  Lab 02/08/21 1921 02/09/21 0643  WBC 7.4 6.1  HGB 10.1* 9.8*  HCT 31.6* 30.6*  MCV 91.9 91.1  PLT 524* 272*   Basic Metabolic Panel: Recent Labs  Lab 02/08/21 1921 02/09/21 0024 02/09/21 0643 02/09/21 1253  NA 121* 121* 140 138  K 3.8 3.9 4.0 4.1  CL 95* 102 108 106  CO2 25 28 26 26   GLUCOSE 104* 162* 106* 94  BUN 21 19 15 13   CREATININE 0.47 0.55 0.38* 0.41*  CALCIUM 7.9* 8.1* 8.4* 8.6*    Studies: CT PELVIS W CONTRAST  Result Date: 02/09/2021 CLINICAL DATA:  Severe left hip pain, atraumatic EXAM: CT PELVIS WITH CONTRAST TECHNIQUE: Multidetector CT imaging of the pelvis was performed using the standard protocol following  the bolus administration of intravenous contrast. CONTRAST:  181mL OMNIPAQUE IOHEXOL 300 MG/ML  SOLN COMPARISON:  None. FINDINGS: Urinary Tract:  Bladder is within normal limits. Bowel:  Sigmoid diverticulosis, without evidence of diverticulitis. Vascular/Lymphatic: No evidence of aneurysm. No suspicious pelvic lymphadenopathy. Reproductive: Suspected calcified fundal fibroids. Uterus is otherwise unremarkable. Bilateral ovaries are within normal limits. Other:  No pelvic ascites. Musculoskeletal: Mild degenerative changes at L4-5. No fracture is seen.  Bilateral hip joint spaces are preserved. Multiloculated fluid collection along the left greater trochanteric bursa (series 2/image 34), involving the gluteus minimus (series 2/image 22), gluteus medius (series 2/image 29), and gluteus maximus musculature (series 2/image 33). Collection measures approximately 2.4 x 9.5 x 11.9 cm in aggregate (series 2/image 31; coronal image 43). Some of these collections demonstrate mild rim enhancement (series 2/image 27). Overlying mild subcutaneous stranding (series 2/image 30). In the absence of trauma, this appearance favors  greater trochanteric bursitis. IMPRESSION: Suspected left greater trochanteric bursitis, as above. Electronically Signed   By: Julian Hy M.D.   On: 02/09/2021 01:48   MR PELVIS W WO CONTRAST  Result Date: 02/09/2021 CLINICAL DATA:  Severe left hip pain.  No injury. EXAM: MRI PELVIS AND LEFT HIP WITHOUT AND WITH CONTRAST TECHNIQUE: Multiplanar multisequence MR imaging of the pelvis was performed both before and after administration of intravenous contrast. CONTRAST:  44mL GADAVIST GADOBUTROL 1 MMOL/ML IV SOLN COMPARISON:  CT pelvis from same day. FINDINGS: Bones: There is no evidence of acute fracture, dislocation or avascular necrosis. No focal bone lesion. The visualized sacroiliac joints and symphysis pubis appear normal. Articular cartilage and labrum Articular cartilage: Mild cartilage  thinning in both hip joints with tiny subchondral cystic changes in the both left anterior acetabular roofs. No focal cartilage defect. Labrum: Small tears of both anterior superior labrums. No paralabral abnormality. Joint or bursal effusion Joint effusion: No significant hip joint effusion. Bursae: Large amount of complex fluid within the left greater trochanteric bursa, some of which is intrinsically T1 hyperintense. Fluid dissects superiorly along the gluteus medius muscle and inferiorly through the right ischiofemoral space and into the posterior compartment of the thigh. This measures at least 24.7 cm in CC dimension with the inferior margin not included in the field of view. There is rim enhancement, but no solid internal enhancement. Muscles and tendons Muscles and tendons: Partial tears of both gluteus minimus tendons. Intact bilateral gluteus medius tendons. The bilateral hamstring and iliopsoas tendons are intact. Reactive muscle edema in the left gluteus, proximal vastus lateralis, and quadratus femoris muscles. Bilateral gluteus minimus muscle atrophy. Other findings Miscellaneous: Small uterine fibroids. Left-sided colonic diverticulosis. IMPRESSION: 1. Severe left greater trochanteric hemorrhagic bursitis as described above. 2. Partial tears of both gluteus minimus tendons, likely chronic given associated muscle atrophy, although there may be a degree of acuity on the left given adjacent bursitis. 3. Mild bilateral hip degenerative changes. Small tears of both anterior superior labrums. Electronically Signed   By: Titus Dubin M.D.   On: 02/09/2021 12:38   MR HIP LEFT W WO CONTRAST  Result Date: 02/09/2021 CLINICAL DATA:  Severe left hip pain.  No injury. EXAM: MRI PELVIS AND LEFT HIP WITHOUT AND WITH CONTRAST TECHNIQUE: Multiplanar multisequence MR imaging of the pelvis was performed both before and after administration of intravenous contrast. CONTRAST:  62mL GADAVIST GADOBUTROL 1 MMOL/ML IV  SOLN COMPARISON:  CT pelvis from same day. FINDINGS: Bones: There is no evidence of acute fracture, dislocation or avascular necrosis. No focal bone lesion. The visualized sacroiliac joints and symphysis pubis appear normal. Articular cartilage and labrum Articular cartilage: Mild cartilage thinning in both hip joints with tiny subchondral cystic changes in the both left anterior acetabular roofs. No focal cartilage defect. Labrum: Small tears of both anterior superior labrums. No paralabral abnormality. Joint or bursal effusion Joint effusion: No significant hip joint effusion. Bursae: Large amount of complex fluid within the left greater trochanteric bursa, some of which is intrinsically T1 hyperintense. Fluid dissects superiorly along the gluteus medius muscle and inferiorly through the right ischiofemoral space and into the posterior compartment of the thigh. This measures at least 24.7 cm in CC dimension with the inferior margin not included in the field of view. There is rim enhancement, but no solid internal enhancement. Muscles and tendons Muscles and tendons: Partial tears of both gluteus minimus tendons. Intact bilateral gluteus medius tendons. The bilateral hamstring and iliopsoas tendons are intact.  Reactive muscle edema in the left gluteus, proximal vastus lateralis, and quadratus femoris muscles. Bilateral gluteus minimus muscle atrophy. Other findings Miscellaneous: Small uterine fibroids. Left-sided colonic diverticulosis. IMPRESSION: 1. Severe left greater trochanteric hemorrhagic bursitis as described above. 2. Partial tears of both gluteus minimus tendons, likely chronic given associated muscle atrophy, although there may be a degree of acuity on the left given adjacent bursitis. 3. Mild bilateral hip degenerative changes. Small tears of both anterior superior labrums. Electronically Signed   By: Titus Dubin M.D.   On: 02/09/2021 12:38   US Venous Img Lower Unilateral Left  Result Date:  02/09/2021 CLINICAL DATA:  Left lower extremity pain. EXAM: Left LOWER EXTREMITY VENOUS DOPPLER ULTRASOUND TECHNIQUE: Gray-scale sonography with compression, as well as color and duplex ultrasound, were performed to evaluate the deep venous system(s) from the level of the common femoral vein through the popliteal and proximal calf veins. COMPARISON:  None. FINDINGS: VENOUS Normal compressibility of the common femoral, superficial femoral, and popliteal veins, as well as the visualized calf veins. Visualized portions of profunda femoral vein and great saphenous vein unremarkable. No filling defects to suggest DVT on grayscale or color Doppler imaging. Doppler waveforms show normal direction of venous flow, normal respiratory plasticity and response to augmentation. Limited views of the contralateral common femoral vein are unremarkable. OTHER There is a complex cystic area or collection with internal echogenic debris in the posterior thigh extending to the popliteal region measuring approximately 6 x 2 x 22 cm. Findings may represent an old hematoma or a large complex Baker cyst. Clinical correlation is recommended. CT or MRI may provide better evaluation if clinically indicated and on a nonemergent basis. Limitations: none IMPRESSION: 1. No sonographic findings of DVT. 2. Large complex collection in the posterior thigh. Electronically Signed   By: Anner Crete M.D.   On: 02/09/2021 01:22    Scheduled Meds:  atorvastatin  10 mg Oral Daily   cholecalciferol  1,000 Units Oral Daily   cyanocobalamin  1,000 mcg Intramuscular Daily   Followed by   Derrill Memo ON 02/16/2021] vitamin B-12  500 mcg Oral Daily   enoxaparin (LOVENOX) injection  40 mg Subcutaneous Q24H   FLUoxetine  10 mg Oral Daily   loratadine  10 mg Oral Daily   Continuous Infusions:  cefTRIAXone (ROCEPHIN)  IV Stopped (02/09/21 0531)   PRN Meds: acetaminophen **OR** acetaminophen, HYDROcodone-acetaminophen, magnesium hydroxide, ondansetron  **OR** ondansetron (ZOFRAN) IV, traZODone  Time spent: 35 minutes  Author: Val Riles. MD [facility] 02/09/2021 3:22 PM  To reach On-call, see care teams to locate the attending and reach out to them via [email or web address]. If 7PM-7AM, please contact night-coverage If you still have difficulty reaching the attending provider, please page the [facility] (Director on Call) for [facility] on amion for assistance.

## 2021-02-09 NOTE — ED Notes (Signed)
RN assisted pt to the restroom. MD paged to ask for sinus medication per pt request

## 2021-02-09 NOTE — ED Notes (Signed)
Pt awake and alert.  No acute distress.

## 2021-02-09 NOTE — ED Notes (Signed)
Patient transported to MRI 

## 2021-02-09 NOTE — ED Notes (Signed)
Ultrasound at the bedside

## 2021-02-09 NOTE — ED Notes (Signed)
Pt in CT.

## 2021-02-09 NOTE — Consult Note (Signed)
ORTHOPAEDIC CONSULTATION  REQUESTING PHYSICIAN: Mansy, Arvella Merles, MD  Chief Complaint:   Left posterior lateral hip pain.  History of Present Illness: Olivia Alvarado is a 70 y.o. female who was in her usual state of health until about 2 weeks ago when she developed increased pain and swelling in the lateral aspect of her left hip.  She did not seek treatment immediately, but several days later she presented to the emergency room when she noticed some ecchymosis developing around her left thigh, raising her concern for a "blood clot".  She presented to the emergency room where plain radiographs and an ultrasound were performed, all of which were unremarkable for any acute bony abnormalities or for a DVT.  She returned home.  Her symptoms worsened several days ago where she had difficulty weightbearing on her left leg, prompting her to return to the emergency room.  Upon arrival to the emergency room, her sodium was noted to be quite low, prompting admission.  Because she was continuing to complain of left hip pain, a CT scan of the pelvis was obtained which demonstrated a large fluid collection in the gluteus region, consistent with a trochanteric bursitis, and prompting orthopedic consultation.  Already, the patient is noticing significant improvement in her symptoms at the time of this consultation.  In fact, upon arriving at her room, she was up walking around in her room without any assistive devices.  She denies any numbness or paresthesias down her leg to her foot, and denies any particular traumatic incident which may have precipitated the onset of her symptoms 2 weeks ago.  Past Medical History:  Diagnosis Date   Atrophic vaginitis    History of kidney stones    IBS (irritable bowel syndrome)    Lower back pain    Nocturia    OAB (overactive bladder)    Pre-diabetes    Skin cancer    BCC   Squamous carcinoma    Suprapubic  pressure    Urethral stricture    Urinary frequency    Urinary incontinence, nocturnal enuresis    Vaginal atrophy    Vulvar lesion    Past Surgical History:  Procedure Laterality Date   COLONOSCOPY     KYPHOPLASTY N/A 09/15/2020   Procedure: T6, T12 Kyphoplasty;  Surgeon: Hessie Knows, MD;  Location: [facility];  Service: Orthopedics;  Laterality: N/A;   SKIN CANCER EXCISION     Social History   Socioeconomic History   Marital status: Married    Spouse name: Not on file   Number of children: Not on file   Years of education: Not on file   Highest education level: Not on file  Occupational History   Not on file  Tobacco Use   Smoking status: Every Day    Packs/day: 0.50    Years: 40.00    Pack years: 20.00    Types: Cigarettes, E-cigarettes   Smokeless tobacco: Never  Vaping Use   Vaping Use: Every day   Substances: Nicotine  Substance and Sexual Activity   Alcohol use: No   Drug use: No   Sexual activity: Not Currently    Birth control/protection: Post-menopausal  Other Topics Concern   Not on file  Social History Narrative   Not on file   Social Determinants of Health   Financial Resource Strain: Not on file  Food Insecurity: Not on file  Transportation Needs: Not on file  Physical Activity: Not on file  Stress: Not on file  Social Connections:  Not on file   Family History  Problem Relation Age of Onset   Breast cancer Maternal Grandmother 69   Colon cancer Maternal Grandmother 46   Colon cancer Paternal Grandmother 36   Kidney cancer Mother 68   Rectal cancer Mother 53   Ovarian cancer Mother 3   Colon cancer Maternal Uncle 34   Colon cancer Other 99       Paternal Great Aunt   Colon cancer Other 55       Paternal Great Aunt   Arthritis Other        mother, Father   Diabetes Other    Hypertension Other    Heart disease Other        Mother, Father   Osteoporosis Other    Allergies  Allergen Reactions   Rosuvastatin Other (See Comments)     Other reaction(s): Muscle Pain   Prior to Admission medications   Medication Sig Start Date End Date Taking? Authorizing Provider  atorvastatin (LIPITOR) 10 MG tablet Take 1 tablet by mouth daily. 07/26/20  Yes [provider]  Cholecalciferol 25 MCG (1000 UT) tablet Take 1,000 Units by mouth daily.   Yes [provider]  FLUoxetine (PROZAC) 10 MG capsule Take 10 mg by mouth daily.   Yes [provider]  loratadine (CLARITIN) 10 MG tablet Take by mouth.   Yes [provider]  HYDROcodone-acetaminophen (NORCO) 5-325 MG tablet Take 1 tablet by mouth every 6 (six) hours as needed for moderate pain. Patient not taking: No sig reported 09/15/20   Hessie Knows, MD   CT PELVIS W CONTRAST  Result Date: 02/09/2021 CLINICAL DATA:  Severe left hip pain, atraumatic EXAM: CT PELVIS WITH CONTRAST TECHNIQUE: Multidetector CT imaging of the pelvis was performed using the standard protocol following the bolus administration of intravenous contrast. CONTRAST:  1108mL OMNIPAQUE IOHEXOL 300 MG/ML  SOLN COMPARISON:  None. FINDINGS: Urinary Tract:  Bladder is within normal limits. Bowel:  Sigmoid diverticulosis, without evidence of diverticulitis. Vascular/Lymphatic: No evidence of aneurysm. No suspicious pelvic lymphadenopathy. Reproductive: Suspected calcified fundal fibroids. Uterus is otherwise unremarkable. Bilateral ovaries are within normal limits. Other:  No pelvic ascites. Musculoskeletal: Mild degenerative changes at L4-5. No fracture is seen.  Bilateral hip joint spaces are preserved. Multiloculated fluid collection along the left greater trochanteric bursa (series 2/image 34), involving the gluteus minimus (series 2/image 22), gluteus medius (series 2/image 29), and gluteus maximus musculature (series 2/image 33). Collection measures approximately 2.4 x 9.5 x 11.9 cm in aggregate (series 2/image 31; coronal image 43). Some of these collections demonstrate mild rim enhancement  (series 2/image 27). Overlying mild subcutaneous stranding (series 2/image 30). In the absence of trauma, this appearance favors greater trochanteric bursitis. IMPRESSION: Suspected left greater trochanteric bursitis, as above. Electronically Signed   By: Julian Hy M.D.   On: 02/09/2021 01:48   MR PELVIS W WO CONTRAST  Result Date: 02/09/2021 CLINICAL DATA:  Severe left hip pain.  No injury. EXAM: MRI PELVIS AND LEFT HIP WITHOUT AND WITH CONTRAST TECHNIQUE: Multiplanar multisequence MR imaging of the pelvis was performed both before and after administration of intravenous contrast. CONTRAST:  67mL GADAVIST GADOBUTROL 1 MMOL/ML IV SOLN COMPARISON:  CT pelvis from same day. FINDINGS: Bones: There is no evidence of acute fracture, dislocation or avascular necrosis. No focal bone lesion. The visualized sacroiliac joints and symphysis pubis appear normal. Articular cartilage and labrum Articular cartilage: Mild cartilage thinning in both hip joints with tiny subchondral cystic changes in the both  left anterior acetabular roofs. No focal cartilage defect. Labrum: Small tears of both anterior superior labrums. No paralabral abnormality. Joint or bursal effusion Joint effusion: No significant hip joint effusion. Bursae: Large amount of complex fluid within the left greater trochanteric bursa, some of which is intrinsically T1 hyperintense. Fluid dissects superiorly along the gluteus medius muscle and inferiorly through the right ischiofemoral space and into the posterior compartment of the thigh. This measures at least 24.7 cm in CC dimension with the inferior margin not included in the field of view. There is rim enhancement, but no solid internal enhancement. Muscles and tendons Muscles and tendons: Partial tears of both gluteus minimus tendons. Intact bilateral gluteus medius tendons. The bilateral hamstring and iliopsoas tendons are intact. Reactive muscle edema in the left gluteus, proximal vastus lateralis,  and quadratus femoris muscles. Bilateral gluteus minimus muscle atrophy. Other findings Miscellaneous: Small uterine fibroids. Left-sided colonic diverticulosis. IMPRESSION: 1. Severe left greater trochanteric hemorrhagic bursitis as described above. 2. Partial tears of both gluteus minimus tendons, likely chronic given associated muscle atrophy, although there may be a degree of acuity on the left given adjacent bursitis. 3. Mild bilateral hip degenerative changes. Small tears of both anterior superior labrums. Electronically Signed   By: Titus Dubin M.D.   On: 02/09/2021 12:38   MR HIP LEFT W WO CONTRAST  Result Date: 02/09/2021 CLINICAL DATA:  Severe left hip pain.  No injury. EXAM: MRI PELVIS AND LEFT HIP WITHOUT AND WITH CONTRAST TECHNIQUE: Multiplanar multisequence MR imaging of the pelvis was performed both before and after administration of intravenous contrast. CONTRAST:  89mL GADAVIST GADOBUTROL 1 MMOL/ML IV SOLN COMPARISON:  CT pelvis from same day. FINDINGS: Bones: There is no evidence of acute fracture, dislocation or avascular necrosis. No focal bone lesion. The visualized sacroiliac joints and symphysis pubis appear normal. Articular cartilage and labrum Articular cartilage: Mild cartilage thinning in both hip joints with tiny subchondral cystic changes in the both left anterior acetabular roofs. No focal cartilage defect. Labrum: Small tears of both anterior superior labrums. No paralabral abnormality. Joint or bursal effusion Joint effusion: No significant hip joint effusion. Bursae: Large amount of complex fluid within the left greater trochanteric bursa, some of which is intrinsically T1 hyperintense. Fluid dissects superiorly along the gluteus medius muscle and inferiorly through the right ischiofemoral space and into the posterior compartment of the thigh. This measures at least 24.7 cm in CC dimension with the inferior margin not included in the field of view. There is rim enhancement,  but no solid internal enhancement. Muscles and tendons Muscles and tendons: Partial tears of both gluteus minimus tendons. Intact bilateral gluteus medius tendons. The bilateral hamstring and iliopsoas tendons are intact. Reactive muscle edema in the left gluteus, proximal vastus lateralis, and quadratus femoris muscles. Bilateral gluteus minimus muscle atrophy. Other findings Miscellaneous: Small uterine fibroids. Left-sided colonic diverticulosis. IMPRESSION: 1. Severe left greater trochanteric hemorrhagic bursitis as described above. 2. Partial tears of both gluteus minimus tendons, likely chronic given associated muscle atrophy, although there may be a degree of acuity on the left given adjacent bursitis. 3. Mild bilateral hip degenerative changes. Small tears of both anterior superior labrums. Electronically Signed   By: Titus Dubin M.D.   On: 02/09/2021 12:38   US Venous Img Lower Unilateral Left  Result Date: 02/09/2021 CLINICAL DATA:  Left lower extremity pain. EXAM: Left LOWER EXTREMITY VENOUS DOPPLER ULTRASOUND TECHNIQUE: Gray-scale sonography with compression, as well as color and duplex ultrasound, were performed to evaluate the  deep venous system(s) from the level of the common femoral vein through the popliteal and proximal calf veins. COMPARISON:  None. FINDINGS: VENOUS Normal compressibility of the common femoral, superficial femoral, and popliteal veins, as well as the visualized calf veins. Visualized portions of profunda femoral vein and great saphenous vein unremarkable. No filling defects to suggest DVT on grayscale or color Doppler imaging. Doppler waveforms show normal direction of venous flow, normal respiratory plasticity and response to augmentation. Limited views of the contralateral common femoral vein are unremarkable. OTHER There is a complex cystic area or collection with internal echogenic debris in the posterior thigh extending to the popliteal region measuring approximately  6 x 2 x 22 cm. Findings may represent an old hematoma or a large complex Baker cyst. Clinical correlation is recommended. CT or MRI may provide better evaluation if clinically indicated and on a nonemergent basis. Limitations: none IMPRESSION: 1. No sonographic findings of DVT. 2. Large complex collection in the posterior thigh. Electronically Signed   By: Anner Crete M.D.   On: 02/09/2021 01:22    Positive ROS: All other systems have been reviewed and were otherwise negative with the exception of those mentioned in the HPI and as above.  Physical Exam: General:  Alert, no acute distress Psychiatric:  Patient is competent for consent with normal mood and affect   Cardiovascular:  No pedal edema Respiratory:  No wheezing, non-labored breathing GI:  Abdomen is soft and non-tender Skin:  No lesions in the area of chief complaint Neurologic:  Sensation intact distally Lymphatic:  No axillary or cervical lymphadenopathy  Orthopedic Exam:  Orthopedic examination is limited to the left hip and lower extremity.  Upon arrival to the room, the patient is ambulating around her bed with a mild limp, but is not using any assistive devices.  Upon getting into bed, the leg appears to be appropriately aligned and symmetric to the right leg.  There is some swelling and resolving ecchymosis over the lateral aspect of the left hip extending into the thigh.  She has mild/moderate tenderness to palpation over the greater trochanteric region.  She is able to actively flex and extend her hip with only minimal discomfort.  She is able to tolerate hip flexion to 90 degrees, internal rotation to 50 degrees, and external rotation to 45 degrees with only mild discomfort.  She is neurovascularly intact to the left lower extremity and foot.  X-rays:  A recent CT scan of the pelvis and left hip is available for review and has been reviewed by myself.  The findings are as described above.  A recent MRI scan of the pelvis  and left hip also is available for review and has been reviewed by myself.  The findings are as described above again.  Assessment: Hemorrhagic acute left trochanteric bursitis, probably secondary to tearing of the gluteus minimus tendon, already improving symptomatically.  Plan: The treatment options have been discussed with the patient and her husband, who is at the bedside.  Already, the patient does feel that she is improving as her ability to ambulate in her room without any assistive devices or assistance attests.  Therefore, I do not feel we need to initiate any more invasive treatments at this time, such as requesting the fluid to be drained by interventional radiology.  The patient can be managed symptomatically with heat or heat alternating with ice to the hip region.  She may be mobilized progressively as symptoms permit.  Based on the MRI findings, there  is no indication for any type of surgical intervention at this time or in the near future, unless her symptoms again began to worsen.  Thank you for asking me to participate in the care of this most pleasant woman.  I will be happy to follow her with you.    Pascal Lux, MD  Beeper #:  [phone]  02/09/2021 6:48 PM

## 2021-02-09 NOTE — ED Notes (Signed)
Report messaged to Bet rn  floor nurse

## 2021-02-09 NOTE — H&P (Addendum)
[place]   PATIENT NAME: Olivia Alvarado    MR#:  [id number]  DATE OF BIRTH:  1950/11/02  DATE OF ADMISSION:  02/08/2021  PRIMARY CARE PHYSICIAN: Sofie Hartigan, MD   Patient is coming from: Home  REQUESTING/REFERRING PHYSICIAN: Vladimir Crofts, MD  CHIEF COMPLAINT:   Chief Complaint  Patient presents with   Leg Pain    HISTORY OF PRESENT ILLNESS:  Olivia Alvarado is a 70 y.o. Caucasian female with medical history significant for dyslipidemia, depression, low back pain and irritable bowel syndrome, who presented to the ER with acute onset of left leg pain.  About a week ago he was seen in the ED after having left proximal leg atraumatic bruising and was discharged home for negative work-up.  He had outpatient blood work with his PCP on a follow-up on 11/4 and his sodium level was 141.  In the ER tonight she experienced worsening left thigh pain and difficulty ambulation due to pain especially on the lateral side.  The bruising itself has been improving per her report but descended to her left ankle and left lateral hip pain has been increasing with radiation to the left knee and lateral left thigh.  No fever or chills.  She denies any falls or trauma or paresthesias or focal muscle weakness.  No nausea or vomiting or abdominal pain.  No dysuria, oliguria or hematuria urgency or frequency or flank pain..  No other bleeding diathesis.   ED Course: In the ER blood pressure was 141/72 with otherwise normal vitals.  Labs revealed hyponatremia 120 1 repeat levels the same.  Chloride was 95 and later 102 CBC showed anemia better than previous levels.  UA was positive for UTI. EKG as reviewed by me : EKG showed normal sinus rhythm with a rate of 84 with T wave inversion laterally Imaging: Pelvic CT with contrast revealed suspected left greater trochanteric bursitis.  The patient was given 1 L bolus of IV normal saline and will be given IV Rocephin for UTI.  She will be admitted to  a medical bed for further evaluation and management. PAST MEDICAL HISTORY:   Past Medical History:  Diagnosis Date   Atrophic vaginitis    History of kidney stones    IBS (irritable bowel syndrome)    Lower back pain    Nocturia    OAB (overactive bladder)    Pre-diabetes    Skin cancer    BCC   Squamous carcinoma    Suprapubic pressure    Urethral stricture    Urinary frequency    Urinary incontinence, nocturnal enuresis    Vaginal atrophy    Vulvar lesion     PAST SURGICAL HISTORY:   Past Surgical History:  Procedure Laterality Date   COLONOSCOPY     KYPHOPLASTY N/A 09/15/2020   Procedure: T6, T12 Kyphoplasty;  Surgeon: Hessie Knows, MD;  Location: [facility] ORS;  Service: Orthopedics;  Laterality: N/A;   SKIN CANCER EXCISION      SOCIAL HISTORY:   Social History   Tobacco Use   Smoking status: Every Day    Packs/day: 0.50    Years: 40.00    Pack years: 20.00    Types: Cigarettes, E-cigarettes   Smokeless tobacco: Never  Substance Use Topics   Alcohol use: No    FAMILY HISTORY:   Family History  Problem Relation Age of Onset   Breast cancer Maternal Grandmother 32   Colon cancer Maternal Grandmother 49   Colon  cancer Paternal Grandmother 22   Kidney cancer Mother 64   Rectal cancer Mother 49   Ovarian cancer Mother 57   Colon cancer Maternal Uncle 77   Colon cancer Other 71       Paternal Great Aunt   Colon cancer Other 61       Paternal Great Aunt   Arthritis Other        mother, Father   Diabetes Other    Hypertension Other    Heart disease Other        Mother, Father   Osteoporosis Other     DRUG ALLERGIES:   Allergies  Allergen Reactions   Rosuvastatin Other (See Comments)    Other reaction(s): Muscle Pain    REVIEW OF SYSTEMS:   ROS As per history of present illness. All pertinent systems were reviewed above. Constitutional, HEENT, cardiovascular, respiratory, GI, GU, musculoskeletal, neuro, psychiatric, endocrine, integumentary and  hematologic systems were reviewed and are otherwise negative/unremarkable except for positive findings mentioned above in the HPI.   MEDICATIONS AT HOME:   Prior to Admission medications   Medication Sig Start Date End Date Taking? Authorizing Provider  atorvastatin (LIPITOR) 10 MG tablet Take 1 tablet by mouth daily. 07/26/20   [provider]  Cholecalciferol 25 MCG (1000 UT) tablet Take 1,000 Units by mouth daily.    [provider]  FLUoxetine (PROZAC) 10 MG capsule Take 10 mg by mouth daily.    [provider]  HYDROcodone-acetaminophen (NORCO) 5-325 MG tablet Take 1 tablet by mouth every 6 (six) hours as needed for moderate pain. 09/15/20   Hessie Knows, MD      VITAL SIGNS:  Blood pressure 137/69, pulse 72, temperature 98 F (36.7 C), temperature source Oral, resp. rate 15, height 5\' 2"  (1.575 m), weight 62.1 kg, SpO2 99 %.  PHYSICAL EXAMINATION:  Physical Exam  GENERAL:  70 y.o.-year-old Caucasian female patient lying in the bed with no acute distress.  EYES: Pupils equal, round, reactive to light and accommodation. No scleral icterus. Extraocular muscles intact.  HEENT: Head atraumatic, normocephalic. Oropharynx and nasopharynx clear.  NECK:  Supple, no jugular venous distention. No thyroid enlargement, no tenderness.  LUNGS: Normal breath sounds bilaterally, no wheezing, rales,rhonchi or crepitation. No use of accessory muscles of respiration.  CARDIOVASCULAR: Regular rate and rhythm, S1, S2 normal. No murmurs, rubs, or gallops.  ABDOMEN: Soft, nondistended, nontender. Bowel sounds present. No organomegaly or mass.  EXTREMITIES: No pedal edema, cyanosis, or clubbing. Musculoskeletal: Left lateral thigh tenderness with more tenderness over the her greater trochanter. NEUROLOGIC: Cranial nerves II through XII are intact. Muscle strength 5/5 in all extremities. Sensation intact. Gait not checked.  PSYCHIATRIC: The patient is alert and oriented x 3.   Normal affect and good eye contact. SKIN: No obvious rash, lesion, or ulcer.   LABORATORY PANEL:   CBC Recent Labs  Lab 02/08/21 1921  WBC 7.4  HGB 10.1*  HCT 31.6*  PLT 524*   ------------------------------------------------------------------------------------------------------------------  Chemistries  Recent Labs  Lab 02/09/21 0024  NA 121*  K 3.9  CL 102  CO2 28  GLUCOSE 162*  BUN 19  CREATININE 0.55  CALCIUM 8.1*   ------------------------------------------------------------------------------------------------------------------  Cardiac Enzymes No results for input(s): TROPONINI in the last 168 hours. ------------------------------------------------------------------------------------------------------------------  RADIOLOGY:  CT PELVIS W CONTRAST  Result Date: 02/09/2021 CLINICAL DATA:  Severe left hip pain, atraumatic EXAM: CT PELVIS WITH CONTRAST TECHNIQUE: Multidetector CT imaging of the pelvis was performed using the standard protocol following the  bolus administration of intravenous contrast. CONTRAST:  172mL OMNIPAQUE IOHEXOL 300 MG/ML  SOLN COMPARISON:  None. FINDINGS: Urinary Tract:  Bladder is within normal limits. Bowel:  Sigmoid diverticulosis, without evidence of diverticulitis. Vascular/Lymphatic: No evidence of aneurysm. No suspicious pelvic lymphadenopathy. Reproductive: Suspected calcified fundal fibroids. Uterus is otherwise unremarkable. Bilateral ovaries are within normal limits. Other:  No pelvic ascites. Musculoskeletal: Mild degenerative changes at L4-5. No fracture is seen.  Bilateral hip joint spaces are preserved. Multiloculated fluid collection along the left greater trochanteric bursa (series 2/image 34), involving the gluteus minimus (series 2/image 22), gluteus medius (series 2/image 29), and gluteus maximus musculature (series 2/image 33). Collection measures approximately 2.4 x 9.5 x 11.9 cm in aggregate (series 2/image 31; coronal image 43).  Some of these collections demonstrate mild rim enhancement (series 2/image 27). Overlying mild subcutaneous stranding (series 2/image 30). In the absence of trauma, this appearance favors greater trochanteric bursitis. IMPRESSION: Suspected left greater trochanteric bursitis, as above. Electronically Signed   By: Julian Hy M.D.   On: 02/09/2021 01:48   US Venous Img Lower Unilateral Left  Result Date: 02/09/2021 CLINICAL DATA:  Left lower extremity pain. EXAM: Left LOWER EXTREMITY VENOUS DOPPLER ULTRASOUND TECHNIQUE: Gray-scale sonography with compression, as well as color and duplex ultrasound, were performed to evaluate the deep venous system(s) from the level of the common femoral vein through the popliteal and proximal calf veins. COMPARISON:  None. FINDINGS: VENOUS Normal compressibility of the common femoral, superficial femoral, and popliteal veins, as well as the visualized calf veins. Visualized portions of profunda femoral vein and great saphenous vein unremarkable. No filling defects to suggest DVT on grayscale or color Doppler imaging. Doppler waveforms show normal direction of venous flow, normal respiratory plasticity and response to augmentation. Limited views of the contralateral common femoral vein are unremarkable. OTHER There is a complex cystic area or collection with internal echogenic debris in the posterior thigh extending to the popliteal region measuring approximately 6 x 2 x 22 cm. Findings may represent an old hematoma or a large complex Baker cyst. Clinical correlation is recommended. CT or MRI may provide better evaluation if clinically indicated and on a nonemergent basis. Limitations: none IMPRESSION: 1. No sonographic findings of DVT. 2. Large complex collection in the posterior thigh. Electronically Signed   By: Anner Crete M.D.   On: 02/09/2021 01:22      IMPRESSION AND PLAN:  Active Problems:   Hyponatremia  1.  Hyponatremia. - The patient be admitted to a  medical bed. - She will be hydrated with IV normal saline. - We will follow sodium level. - Hyponatremia work-up was ordered.  2.  Left trochanteric bursitis. - Pain management will be provided. - She will be placed on  prn IV Toradol. - Orthopedic consultation be obtained for possible steroid injection. -I notified Dr. Roland Rack about the patient.  3.  UTI. - The patient will be placed on IV Rocephin and will follow urine culture and sensitivity.  4.  Dyslipidemia. - We will continue statin therapy  5.  Depression. - We will continue Prozac.  6.  Vitamin D deficiency. - Continue vitamin D.  DVT prophylaxis: Lovenox. Code Status: full code. Family Communication:  The plan of care was discussed in details with the patient (and family). I answered all questions. The patient agreed to proceed with the above mentioned plan. Further management will depend upon hospital course. Disposition Plan: Back to previous home environment Consults called: Orthopedic consult.   All the records  are reviewed and case discussed with ED provider.  Status is: Inpatient   Remains inpatient appropriate because:Ongoing diagnostic testing needed not appropriate for outpatient work up, Unsafe d/c plan, IV treatments appropriate due to intensity of illness or inability to take PO, and Inpatient level of care appropriate due to severity of illness   Dispo: The patient is from: Home              Anticipated d/c is to: Home              Patient currently is not medically stable to d/c.              Difficult to place patient: No  TOTAL TIME TAKING CARE OF THIS PATIENT: 55 minutes.     Christel Mormon M.D on 02/09/2021 at 2:41 AM  [facility]   From 7 PM-7 AM, contact night-coverage [email or web address]  CC: Primary care physician; Sofie Hartigan, MD

## 2021-02-09 NOTE — Plan of Care (Signed)

## 2021-02-10 DIAGNOSIS — S76012A Strain of muscle, fascia and tendon of left hip, initial encounter: Secondary | ICD-10-CM | POA: Insufficient documentation

## 2021-02-10 DIAGNOSIS — M7062 Trochanteric bursitis, left hip: Secondary | ICD-10-CM | POA: Insufficient documentation

## 2021-02-10 LAB — BASIC METABOLIC PANEL
Anion gap: 7 (ref 5–15)
Anion gap: 7 (ref 5–15)
Anion gap: 18 — ABNORMAL HIGH (ref 5–15)
BUN: 15 mg/dL (ref 8–23)
BUN: 19 mg/dL (ref 8–23)
BUN: 21 mg/dL (ref 8–23)
CO2: 29 mmol/L (ref 22–32)
CO2: 28 mmol/L (ref 22–32)
CO2: 25 mmol/L (ref 22–32)
Calcium: 8.9 mg/dL (ref 8.9–10.3)
Calcium: 8.1 mg/dL — ABNORMAL LOW (ref 8.9–10.3)
Calcium: 7.9 mg/dL — ABNORMAL LOW (ref 8.9–10.3)
Chloride: 104 mmol/L (ref 98–111)
Chloride: 102 mmol/L (ref 98–111)
Chloride: 95 mmol/L — ABNORMAL LOW (ref 98–111)
Creatinine, Ser: 0.49 mg/dL (ref 0.44–1.00)
Creatinine, Ser: 0.55 mg/dL (ref 0.44–1.00)
Creatinine, Ser: 0.47 mg/dL (ref 0.44–1.00)
GFR, Estimated: >60 mL/min (ref >60)
GFR, Estimated: >60 mL/min (ref >60)
GFR, Estimated: >60 mL/min (ref >60)
Glucose, Bld: 124 mg/dL — ABNORMAL HIGH (ref 70–99)
Glucose, Bld: 162 mg/dL — ABNORMAL HIGH (ref 70–99)
Glucose, Bld: 104 mg/dL — ABNORMAL HIGH (ref 70–99)
Potassium: 4.6 mmol/L (ref 3.5–5.1)
Potassium: 3.9 mmol/L (ref 3.5–5.1)
Potassium: 3.8 mmol/L (ref 3.5–5.1)
Sodium: 140 mmol/L (ref 135–145)
Sodium: 137 mmol/L (ref 135–145)
Sodium: 138 mmol/L (ref 135–145)

## 2021-02-10 LAB — CBC
HCT: 32.2 % — ABNORMAL LOW (ref 36.0–46.0)
Hemoglobin: 10 g/dL — ABNORMAL LOW (ref 12.0–15.0)
MCH: 28.1 pg (ref 26.0–34.0)
MCHC: 31.1 g/dL (ref 30.0–36.0)
MCV: 90.4 fL (ref 80.0–100.0)
Platelets: 499 10*3/uL — ABNORMAL HIGH (ref 150–400)
RBC: 3.56 MIL/uL — ABNORMAL LOW (ref 3.87–5.11)
RDW: 13.8 % (ref 11.5–15.5)
WBC: 6.4 10*3/uL (ref 4.0–10.5)
nRBC: 0 % (ref 0.0–0.2)

## 2021-02-10 LAB — PHOSPHORUS: Phosphorus: 4.5 mg/dL (ref 2.5–4.6)

## 2021-02-10 LAB — MAGNESIUM: Magnesium: 2.3 mg/dL (ref 1.7–2.4)

## 2021-02-10 MED ORDER — CYANOCOBALAMIN 500 MCG PO TABS
500.0000 ug | ORAL_TABLET | Freq: Every day | ORAL | 0 refills | Status: AC
Start: 2021-02-16 — End: 2021-05-17

## 2021-02-10 NOTE — Progress Notes (Signed)
   02/10/21 0900  Clinical Encounter Type  Visited With Patient  Visit Type Initial;Spiritual support  Referral From Nurse  Consult/Referral To Chaplain  Spiritual Encounters  Spiritual Needs Prayer;Emotional  Chaplain Kalliope Riesen responded to an OR for room 140A, Mrs. Railyn House. Pt was up walking around and in good spirits, she stated, "I am doing well, I am doing a lot better and ready to go home." Pt desired prayer, prayer was given and received. Mrs. Nashla thanked me for the visit and said it's always good to see the Chaplain.

## 2021-02-10 NOTE — Progress Notes (Signed)
Discharge Note: Reviewed discharge instructions with pt.  Pt verbalized understanding. Pt discharged with all personal belongings. Staff wheeled pt out. Pt transported to home via family transportation.

## 2021-02-10 NOTE — Progress Notes (Addendum)
Rounded and offered 4 Ps.

## 2021-02-10 NOTE — Progress Notes (Signed)
Patient ID: Olivia Alvarado, female   DOB: [birth year], 70 y.o.   MRN: [id number]  Subjective: The patient notes that her left hip continues to feel better.  She has been ambulating around the room without any assistive devices, and is hoping to be able to go home today.   Objective: Vital signs in last 24 hours: Temp:  [97.5 F (36.4 C)-97.9 F (36.6 C)] 97.8 F (36.6 C) (11/09 1158) Pulse Rate:  [68-91] 80 (11/09 1158) Resp:  [15-17] 16 (11/09 1158) BP: (114-138)/(59-67) 125/63 (11/09 1158) SpO2:  [95 %-100 %] 95 % (11/09 1158)  Intake/Output from previous day: 11/08 0701 - 11/09 0700 In: 220 [P.O.:220] Out: -  Intake/Output this shift: Total I/O In: 240 [P.O.:240] Out: -   Recent Labs    02/08/21 1921 02/09/21 0643 02/10/21 0314  HGB 10.1* 9.8* 10.0*   Recent Labs    02/09/21 0643 02/10/21 0314  WBC 6.1 6.4  RBC 3.36* 3.56*  HCT 30.6* 32.2*  PLT 475* 499*   Recent Labs    02/09/21 1253 02/10/21 0314  NA 138 140  K 4.1 4.6  CL 106 104  CO2 26 29  BUN 13 15  CREATININE 0.41* 0.49  GLUCOSE 94 124*  CALCIUM 8.6* 8.9   No results for input(s): LABPT, INR in the last 72 hours.  Physical Exam: Orthopedic examination again is limited to left hip and lower extremity.  The findings are unchanged as compared to yesterday.  She remains neurovascularly intact to the left lower extremity and foot.  Assessment: Traumatic hemorrhagic trochanteric bursitis left hip, improving symptomatically.  Plan: The patient may continue to be mobilized with physical therapy and may progress in her activities as symptoms permit, but is to avoid offending activities.  She may take over-the-counter medications as needed for discomfort.  She is cleared for discharge from an orthopedic standpoint.  I will sign off at this time.  If you have need for further orthopedic input during this hospitalization, please reconsult me.  Otherwise she may follow-up in my office on an as necessary  basis.   Marshall Cork Olivia Alvarado 02/10/2021, 2:23 PM

## 2021-02-10 NOTE — Discharge Summary (Signed)
[facility] Discharge Summary   Patient: NEAL OSHEA SWN:[id number]  PCP: Sofie Hartigan, MD  Date of admission: 02/08/2021   Date of discharge:  02/10/2021     Discharge Diagnoses:   Principal Problem:   Greater trochanteric bursitis of left hip Active Problems:   Hyponatremia   Admitted From: Home Disposition:  Home   Recommendations for Outpatient Follow-up:  PCP: in 1 wk F/u orthopedics in 1 to 2 weeks if symptoms does not resolve Follow up LABS/TEST: BMP after 1 week   Diet recommendation: Cardiac diet  Activity: The patient is advised to gradually reintroduce usual activities, as tolerated  Discharge Condition: stable  Code Status: Full code   History of present illness: As per the H and P dictated on admission Hospital Course:  Olivia Alvarado is a 70 y.o. Caucasian female with medical history significant for dyslipidemia, depression, low back pain and irritable bowel syndrome, who presented to the ER with acute onset of left leg pain.  About a week ago he was seen in the ED after having left proximal leg atraumatic bruising and was discharged home for negative work-up.  He had outpatient blood work with his PCP on a follow-up on 11/4 and his sodium level was 141.  In the ER tonight she experienced worsening left thigh pain and difficulty ambulation due to pain especially on the lateral side.  The bruising itself has been improving per her report but descended to her left ankle and left lateral hip pain has been increasing with radiation to the left knee and lateral left thigh.  No fever or chills.  She denies any falls or trauma or paresthesias or focal muscle weakness.  No nausea or vomiting or abdominal pain.  No dysuria, oliguria or hematuria urgency or frequency or flank pain..  No other bleeding diathesis.  ED Course: In the ER blood pressure was 141/72 with otherwise normal vitals.  Labs revealed hyponatremia 120 1 repeat levels the same.  Chloride was  95 and later 102 CBC showed anemia better than previous levels.  UA was positive for UTI. EKG as reviewed by me : EKG showed normal sinus rhythm with a rate of 84 with T wave inversion laterally Imaging: Pelvic CT with contrast revealed suspected left greater trochanteric bursitis.  The patient was given 1 L bolus of IV normal saline and will be given IV Rocephin for UTI.  She will be admitted to a medical bed for further evaluation and management  Assessment and Plan: Principal problem: Hemorrhagic greater trochanteric bursitis, left Active problem: Hyponatremia  # Hemorrhagic left greater trochanteric bursitis. S/p PR medications for pain control given and patient was advised to apply ice pack.  Orthopedic surgery was consulted, recommended MRI.  MRI showed hemorrhagic greater trochanteric bursitis.  Orthopedic recommended no surgical intervention at this time, patient is ambulatory and pain is under control.  Patient can be discharged home and follow-up as an outpatient.  Patient agreed with the discharge planning.   # Hyponatremia, resolved after IV fluid, s/p hydrated with IV normal saline. Serum osmolality 295, Na 140 within normal range now.  Follow with PCP to repeat BMP after 1 week, patient was advised to take normal or low salt in the diet as she does not eat salt at all. # UTI, patient received IV ceftriaxone during hospital stay, and patient remained asymptomatic so no need of antibiotics at this time.  Patient was recommended to follow with PCP. # Dyslipidemia, continue statin therapy #  Depression, continue Prozac. #  Vitamin D deficiency, Continue vitamin D. # Anemia hemoglobin 9.8, iron and folate within normal range. # vitamin B12 level 233, target >400, started vitamin B12 1000 mcg IM injection followed by oral supplement.  Follow with PCP to repeat vitamin B12 level after 3 months. Body mass index is 25.06 kg/m.  Nutrition Interventions:   Patient was ambulatory without  any assistance. On the day of the discharge the patient's vitals were stable, and no other acute medical condition were reported by patient. the patient was felt safe to be discharge at Home   Consultants: Orthopedics Procedures: None  Discharge Exam: General: Appear in no distress, no Rash; Oral Mucosa Clear, moist. Cardiovascular: S1 and S2 Present, no Murmur, Respiratory: normal respiratory effort, Bilateral Air entry present and no Crackles, no wheezes Abdomen: Bowel Sound present, Soft and no tenderness, no hernia Extremities: no Pedal edema, no calf tenderness, mild left hip tenderness, no bruise visible Neurology: alert and oriented to time, place, and person affect appropriate.  Filed Weights   02/08/21 1915  Weight: 62.1 kg   Vitals:   02/10/21 0441 02/10/21 1158  BP: 117/66 125/63  Pulse: 68 80  Resp: 16 16  Temp: (!) 97.5 F (36.4 C) 97.8 F (36.6 C)  SpO2: 96% 95%    DISCHARGE MEDICATION: Allergies as of 02/10/2021       Reactions   Rosuvastatin Other (See Comments)   Other reaction(s): Muscle Pain        Medication List     TAKE these medications    atorvastatin 10 MG tablet Commonly known as: LIPITOR Take 1 tablet by mouth daily.   Cholecalciferol 25 MCG (1000 UT) tablet Take 1,000 Units by mouth daily.   FLUoxetine 10 MG capsule Commonly known as: PROZAC Take 10 mg by mouth daily.   HYDROcodone-acetaminophen 5-325 MG tablet Commonly known as: Norco Take 1 tablet by mouth every 6 (six) hours as needed for moderate pain.   loratadine 10 MG tablet Commonly known as: CLARITIN Take by mouth.   vitamin B-12 500 MCG tablet Commonly known as: CYANOCOBALAMIN Take 1 tablet (500 mcg total) by mouth daily. Start taking on: February 16, 2021       Allergies  Allergen Reactions   Rosuvastatin Other (See Comments)    Other reaction(s): Muscle Pain   Discharge Instructions     Call MD for:  difficulty breathing, headache or visual  disturbances   Complete by: As directed    Call MD for:  extreme fatigue   Complete by: As directed    Call MD for:  persistant dizziness or light-headedness   Complete by: As directed    Call MD for:  redness, tenderness, or signs of infection (pain, swelling, redness, odor or green/yellow discharge around incision site)   Complete by: As directed    Call MD for:  severe uncontrolled pain   Complete by: As directed    Call MD for:  temperature >100.4   Complete by: As directed    Diet - low sodium heart healthy   Complete by: As directed    Discharge instructions   Complete by: As directed    Follow-up with PCP in 1 week, repeat vitamin B12 level after 3 months Follow with orthopedics in 1 to 2 weeks if no improvement   Increase activity slowly   Complete by: As directed        The results of significant diagnostics from this hospitalization (including imaging, microbiology, ancillary and  laboratory) are listed below for reference.    Significant Diagnostic Studies: CT PELVIS W CONTRAST  Result Date: 02/09/2021 CLINICAL DATA:  Severe left hip pain, atraumatic EXAM: CT PELVIS WITH CONTRAST TECHNIQUE: Multidetector CT imaging of the pelvis was performed using the standard protocol following the bolus administration of intravenous contrast. CONTRAST:  147mL OMNIPAQUE IOHEXOL 300 MG/ML  SOLN COMPARISON:  None. FINDINGS: Urinary Tract:  Bladder is within normal limits. Bowel:  Sigmoid diverticulosis, without evidence of diverticulitis. Vascular/Lymphatic: No evidence of aneurysm. No suspicious pelvic lymphadenopathy. Reproductive: Suspected calcified fundal fibroids. Uterus is otherwise unremarkable. Bilateral ovaries are within normal limits. Other:  No pelvic ascites. Musculoskeletal: Mild degenerative changes at L4-5. No fracture is seen.  Bilateral hip joint spaces are preserved. Multiloculated fluid collection along the left greater trochanteric bursa (series 2/image 34), involving the  gluteus minimus (series 2/image 22), gluteus medius (series 2/image 29), and gluteus maximus musculature (series 2/image 33). Collection measures approximately 2.4 x 9.5 x 11.9 cm in aggregate (series 2/image 31; coronal image 43). Some of these collections demonstrate mild rim enhancement (series 2/image 27). Overlying mild subcutaneous stranding (series 2/image 30). In the absence of trauma, this appearance favors greater trochanteric bursitis. IMPRESSION: Suspected left greater trochanteric bursitis, as above. Electronically Signed   By: Julian Hy M.D.   On: 02/09/2021 01:48   MR PELVIS W WO CONTRAST  Result Date: 02/09/2021 CLINICAL DATA:  Severe left hip pain.  No injury. EXAM: MRI PELVIS AND LEFT HIP WITHOUT AND WITH CONTRAST TECHNIQUE: Multiplanar multisequence MR imaging of the pelvis was performed both before and after administration of intravenous contrast. CONTRAST:  101mL GADAVIST GADOBUTROL 1 MMOL/ML IV SOLN COMPARISON:  CT pelvis from same day. FINDINGS: Bones: There is no evidence of acute fracture, dislocation or avascular necrosis. No focal bone lesion. The visualized sacroiliac joints and symphysis pubis appear normal. Articular cartilage and labrum Articular cartilage: Mild cartilage thinning in both hip joints with tiny subchondral cystic changes in the both left anterior acetabular roofs. No focal cartilage defect. Labrum: Small tears of both anterior superior labrums. No paralabral abnormality. Joint or bursal effusion Joint effusion: No significant hip joint effusion. Bursae: Large amount of complex fluid within the left greater trochanteric bursa, some of which is intrinsically T1 hyperintense. Fluid dissects superiorly along the gluteus medius muscle and inferiorly through the right ischiofemoral space and into the posterior compartment of the thigh. This measures at least 24.7 cm in CC dimension with the inferior margin not included in the field of view. There is rim enhancement,  but no solid internal enhancement. Muscles and tendons Muscles and tendons: Partial tears of both gluteus minimus tendons. Intact bilateral gluteus medius tendons. The bilateral hamstring and iliopsoas tendons are intact. Reactive muscle edema in the left gluteus, proximal vastus lateralis, and quadratus femoris muscles. Bilateral gluteus minimus muscle atrophy. Other findings Miscellaneous: Small uterine fibroids. Left-sided colonic diverticulosis. IMPRESSION: 1. Severe left greater trochanteric hemorrhagic bursitis as described above. 2. Partial tears of both gluteus minimus tendons, likely chronic given associated muscle atrophy, although there may be a degree of acuity on the left given adjacent bursitis. 3. Mild bilateral hip degenerative changes. Small tears of both anterior superior labrums. Electronically Signed   By: Titus Dubin M.D.   On: 02/09/2021 12:38   MR HIP LEFT W WO CONTRAST  Result Date: 02/09/2021 CLINICAL DATA:  Severe left hip pain.  No injury. EXAM: MRI PELVIS AND LEFT HIP WITHOUT AND WITH CONTRAST TECHNIQUE: Multiplanar multisequence MR imaging of  the pelvis was performed both before and after administration of intravenous contrast. CONTRAST:  62mL GADAVIST GADOBUTROL 1 MMOL/ML IV SOLN COMPARISON:  CT pelvis from same day. FINDINGS: Bones: There is no evidence of acute fracture, dislocation or avascular necrosis. No focal bone lesion. The visualized sacroiliac joints and symphysis pubis appear normal. Articular cartilage and labrum Articular cartilage: Mild cartilage thinning in both hip joints with tiny subchondral cystic changes in the both left anterior acetabular roofs. No focal cartilage defect. Labrum: Small tears of both anterior superior labrums. No paralabral abnormality. Joint or bursal effusion Joint effusion: No significant hip joint effusion. Bursae: Large amount of complex fluid within the left greater trochanteric bursa, some of which is intrinsically T1 hyperintense.  Fluid dissects superiorly along the gluteus medius muscle and inferiorly through the right ischiofemoral space and into the posterior compartment of the thigh. This measures at least 24.7 cm in CC dimension with the inferior margin not included in the field of view. There is rim enhancement, but no solid internal enhancement. Muscles and tendons Muscles and tendons: Partial tears of both gluteus minimus tendons. Intact bilateral gluteus medius tendons. The bilateral hamstring and iliopsoas tendons are intact. Reactive muscle edema in the left gluteus, proximal vastus lateralis, and quadratus femoris muscles. Bilateral gluteus minimus muscle atrophy. Other findings Miscellaneous: Small uterine fibroids. Left-sided colonic diverticulosis. IMPRESSION: 1. Severe left greater trochanteric hemorrhagic bursitis as described above. 2. Partial tears of both gluteus minimus tendons, likely chronic given associated muscle atrophy, although there may be a degree of acuity on the left given adjacent bursitis. 3. Mild bilateral hip degenerative changes. Small tears of both anterior superior labrums. Electronically Signed   By: Titus Dubin M.D.   On: 02/09/2021 12:38   US Venous Img Lower Unilateral Left  Result Date: 02/09/2021 CLINICAL DATA:  Left lower extremity pain. EXAM: Left LOWER EXTREMITY VENOUS DOPPLER ULTRASOUND TECHNIQUE: Gray-scale sonography with compression, as well as color and duplex ultrasound, were performed to evaluate the deep venous system(s) from the level of the common femoral vein through the popliteal and proximal calf veins. COMPARISON:  None. FINDINGS: VENOUS Normal compressibility of the common femoral, superficial femoral, and popliteal veins, as well as the visualized calf veins. Visualized portions of profunda femoral vein and great saphenous vein unremarkable. No filling defects to suggest DVT on grayscale or color Doppler imaging. Doppler waveforms show normal direction of venous flow,  normal respiratory plasticity and response to augmentation. Limited views of the contralateral common femoral vein are unremarkable. OTHER There is a complex cystic area or collection with internal echogenic debris in the posterior thigh extending to the popliteal region measuring approximately 6 x 2 x 22 cm. Findings may represent an old hematoma or a large complex Baker cyst. Clinical correlation is recommended. CT or MRI may provide better evaluation if clinically indicated and on a nonemergent basis. Limitations: none IMPRESSION: 1. No sonographic findings of DVT. 2. Large complex collection in the posterior thigh. Electronically Signed   By: Anner Crete M.D.   On: 02/09/2021 01:22   US Venous Img Lower Unilateral Left  Result Date: 01/31/2021 CLINICAL DATA:  70 year old female with acute LEFT leg pain. EXAM: LEFT LOWER EXTREMITY VENOUS DOPPLER ULTRASOUND TECHNIQUE: Gray-scale sonography with compression, as well as color and duplex ultrasound, were performed to evaluate the deep venous system(s) from the level of the common femoral vein through the popliteal and proximal calf veins. COMPARISON:  None. FINDINGS: VENOUS Normal compressibility of the common femoral, superficial femoral, and  popliteal veins, as well as the visualized calf veins. Visualized portions of profunda femoral vein and great saphenous vein unremarkable. No filling defects to suggest DVT on grayscale or color Doppler imaging. Doppler waveforms show normal direction of venous flow, normal respiratory plasticity and response to augmentation. Limited views of the contralateral common femoral vein are unremarkable. OTHER None. Limitations: none IMPRESSION: Negative. Electronically Signed   By: Margarette Canada M.D.   On: 01/31/2021 13:30   DG FEMUR MIN 2 VIEWS LEFT  Result Date: 01/31/2021 CLINICAL DATA:  Acute LEFT leg pain for 3 days. No known injury. Initial encounter. EXAM: LEFT FEMUR 2 VIEWS COMPARISON:  None. FINDINGS: There is  no evidence of fracture or other focal bone lesions. Soft tissues are unremarkable. IMPRESSION: Negative. Electronically Signed   By: Margarette Canada M.D.   On: 01/31/2021 13:29    Microbiology: Recent Results (from the past 240 hour(s))  Urine Culture     Status: Abnormal   Collection Time: 02/09/21 12:24 AM   Specimen: Urine, Random  Result Value Ref Range Status   Specimen Description   Final    URINE, RANDOM Performed at [facility], [address], [place], [place] [address]    Special Requests   Final    NONE Performed at [facility], [address], [place], [place] [address]    Culture (A)  Final    <10,000 COLONIES/mL INSIGNIFICANT GROWTH Performed at [facility], [place] [address] [place], [place] [address]    Report Status 02/09/2021 FINAL  Final  Resp Panel by RT-PCR (Flu A&B, Covid) Nasopharyngeal Swab     Status: None   Collection Time: 02/09/21  5:51 AM   Specimen: Nasopharyngeal Swab; Nasopharyngeal(NP) swabs in vial transport medium  Result Value Ref Range Status   SARS Coronavirus 2 by RT PCR NEGATIVE NEGATIVE Final    Comment: (NOTE) SARS-CoV-2 target nucleic acids are NOT DETECTED.  The SARS-CoV-2 RNA is generally detectable in upper respiratory specimens during the acute phase of infection. The lowest concentration of SARS-CoV-2 viral copies this assay can detect is 138 copies/mL. A negative result does not preclude SARS-Cov-2 infection and should not be used as the sole basis for treatment or other patient management decisions. A negative result may occur with  improper specimen collection/handling, submission of specimen other than nasopharyngeal swab, presence of viral mutation(s) within the areas targeted by this assay, and inadequate number of viral copies(<138 copies/mL). A negative result must be combined with clinical observations, patient history, and epidemiological information. The expected result is Negative.  Fact  Sheet for Patients:  EntrepreneurPulse.com.au  Fact Sheet for Healthcare Providers:  IncredibleEmployment.be  This test is no t yet approved or cleared by the [place] FDA and  has been authorized for detection and/or diagnosis of SARS-CoV-2 by FDA under an Emergency Use Authorization (EUA). This EUA will remain  in effect (meaning this test can be used) for the duration of the COVID-19 declaration under Section 564(b)(1) of the Act, 21 U.S.C.section 360bbb-3(b)(1), unless the authorization is terminated  or revoked sooner.       Influenza A by PCR NEGATIVE NEGATIVE Final   Influenza B by PCR NEGATIVE NEGATIVE Final    Comment: (NOTE) The Xpert Xpress SARS-CoV-2/FLU/RSV plus assay is intended as an aid in the diagnosis of influenza from Nasopharyngeal swab specimens and should not be used as a sole basis for treatment. Nasal washings and aspirates are unacceptable for Xpert Xpress SARS-CoV-2/FLU/RSV testing.  Fact Sheet  for Patients: EntrepreneurPulse.com.au  Fact Sheet for Healthcare Providers: IncredibleEmployment.be  This test is not yet approved or cleared by the [place] FDA and has been authorized for detection and/or diagnosis of SARS-CoV-2 by FDA under an Emergency Use Authorization (EUA). This EUA will remain in effect (meaning this test can be used) for the duration of the COVID-19 declaration under Section 564(b)(1) of the Act, 21 U.S.C. section 360bbb-3(b)(1), unless the authorization is terminated or revoked.  Performed at [facility], [address] [place], [place] [address]      Labs: CBC: Recent Labs  Lab 02/08/21 1921 02/09/21 0643 02/10/21 0314  WBC 7.4 6.1 6.4  HGB 10.1* 9.8* 10.0*  HCT 31.6* 30.6* 32.2*  MCV 91.9 91.1 90.4  PLT 524* 475* 818*   Basic Metabolic Panel: Recent Labs  Lab 02/08/21 1921 02/09/21 0024 02/09/21 0643 02/09/21 1253  02/10/21 0314  NA 121* 137 140 138 140  K 3.8 3.9 4.0 4.1 4.6  CL 95* 102 108 106 104  CO2 25 28 26 26 29   GLUCOSE 104* 162* 106* 94 124*  BUN 21 19 15 13 15   CREATININE 0.47 0.55 0.38* 0.41* 0.49  CALCIUM 7.9* 8.1* 8.4* 8.6* 8.9  MG  --   --   --   --  2.3  PHOS  --   --   --   --  4.5   Liver Function Tests: No results for input(s): AST, ALT, ALKPHOS, BILITOT, PROT, ALBUMIN in the last 168 hours. No results for input(s): LIPASE, AMYLASE in the last 168 hours. No results for input(s): AMMONIA in the last 168 hours. Cardiac Enzymes: No results for input(s): CKTOTAL, CKMB, CKMBINDEX, TROPONINI in the last 168 hours. BNP (last 3 results) No results for input(s): BNP in the last 8760 hours. CBG: No results for input(s): GLUCAP in the last 168 hours.  Time spent: 35 minutes  Signed:  Val Riles  [facility]  02/10/2021 2:23 PM

## 2021-02-10 NOTE — Progress Notes (Signed)
Rounded on patient. Offered 4 Ps. Patient stated " I am good just want to take a nap". I told him I would stop back by but give him time to sleep.

## 2021-02-22 ENCOUNTER — Other Ambulatory Visit: Payer: Self-pay

## 2021-02-22 ENCOUNTER — Ambulatory Visit
Admission: RE | Admit: 2021-02-22 | Discharge: 2021-02-22 | Disposition: A | Discharge status: Home / Self Care | Payer: Medicare Other | Source: Ambulatory Visit | Attending: Family Medicine | Admitting: Family Medicine

## 2021-02-22 DIAGNOSIS — Z1231 Encounter for screening mammogram for malignant neoplasm of breast: Secondary | ICD-10-CM | POA: Diagnosis present

## 2021-02-22 IMAGING — MG MM DIGITAL SCREENING BILAT W/ TOMO AND CAD
8 series · 8 of 24 positions shown · non-contrast
Comparison: Previous exam(s).

CLINICAL DATA: Screening.

EXAM:
DIGITAL SCREENING BILATERAL MAMMOGRAM WITH TOMOSYNTHESIS AND CAD
TECHNIQUE: Bilateral screening digital craniocaudal and mediolateral oblique
mammograms were obtained. Bilateral screening digital breast
tomosynthesis was performed. The images were evaluated with
computer-aided detection.

[L CC synth-2D]
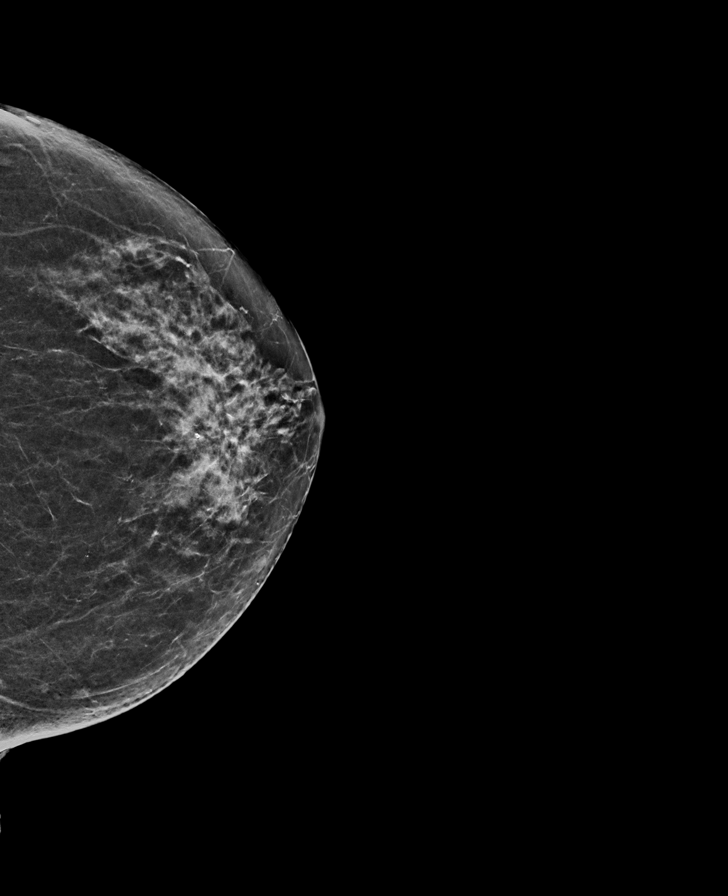

[R MLO synth-2D]
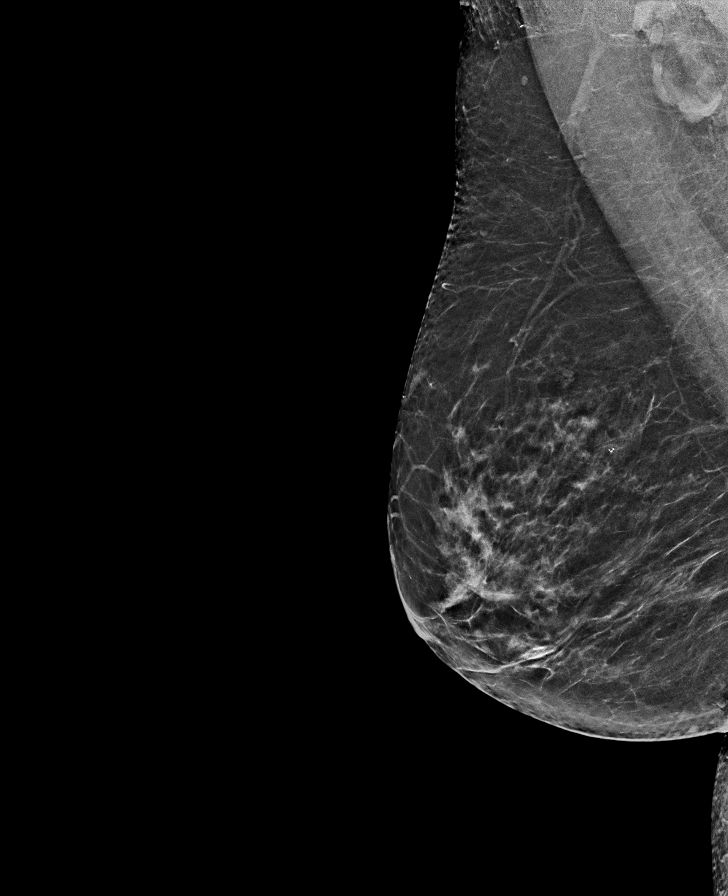

[L MLO synth-2D]
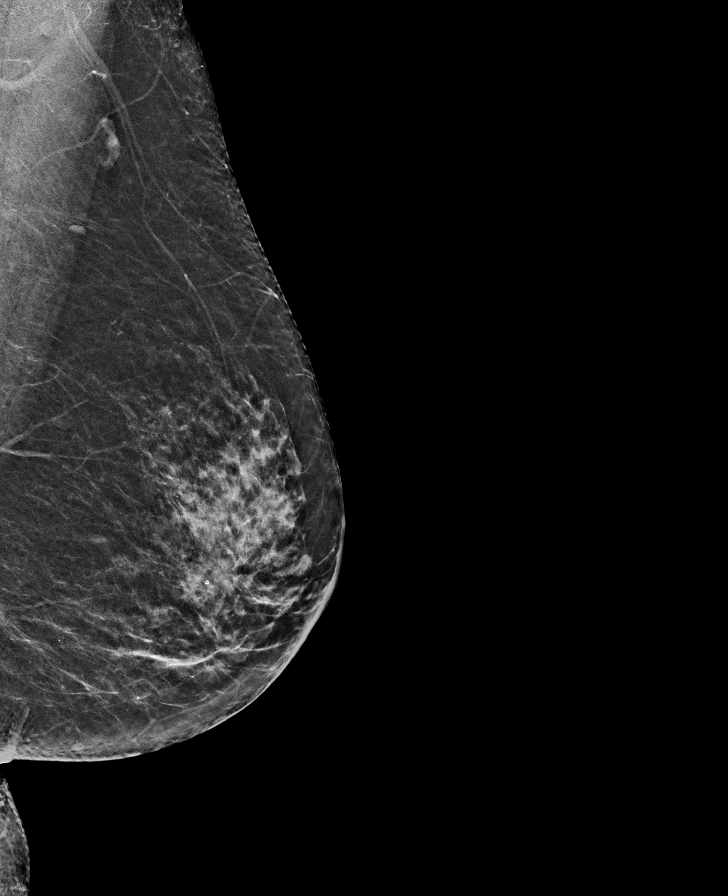

[R CC synth-2D]
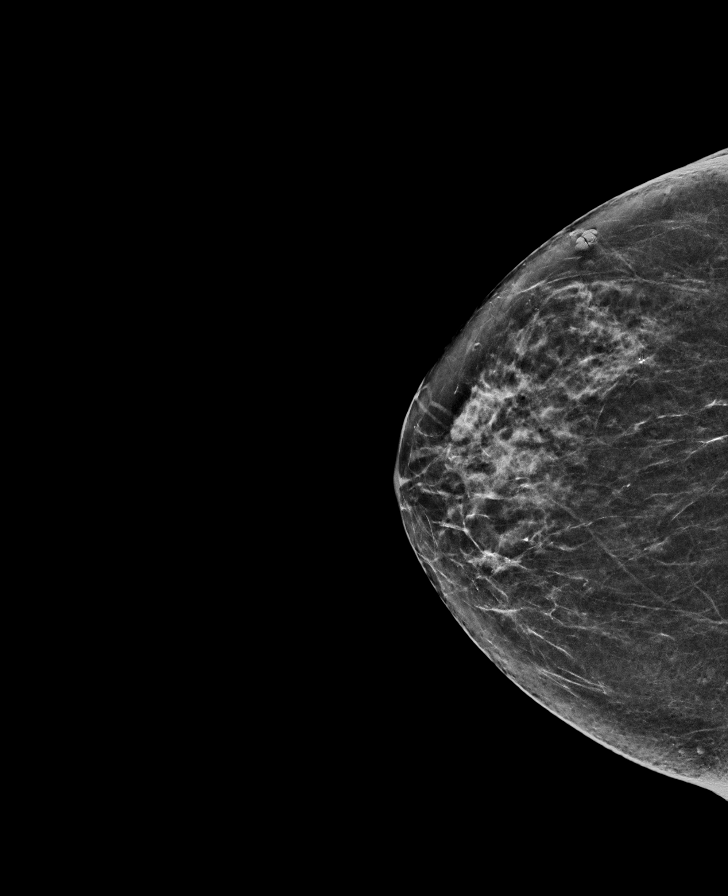

[R MLO tomo · tomo slice 33/64.0]
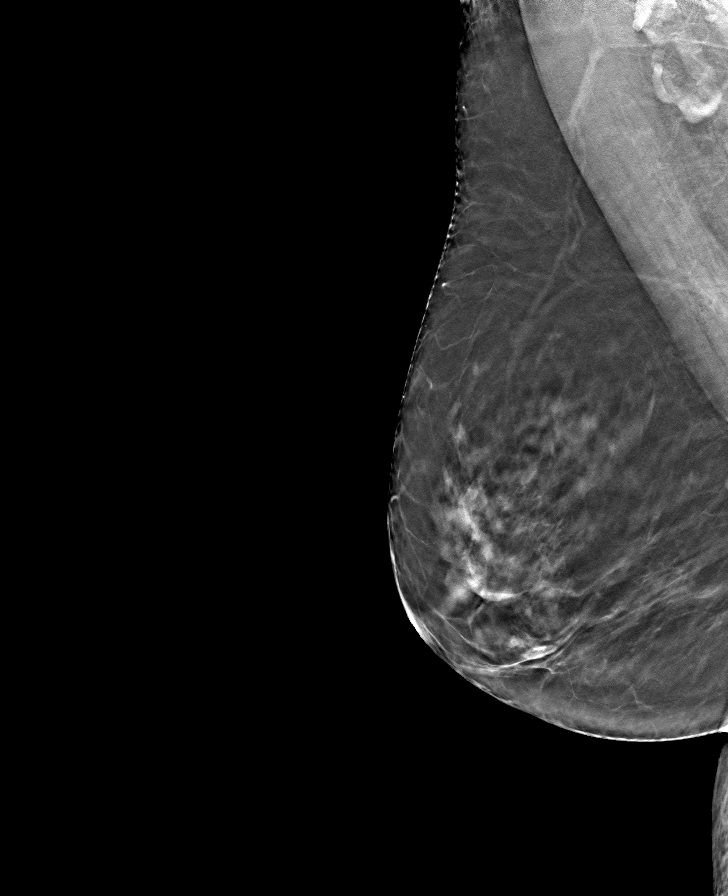

[L MLO tomo · tomo slice 29/58.0]
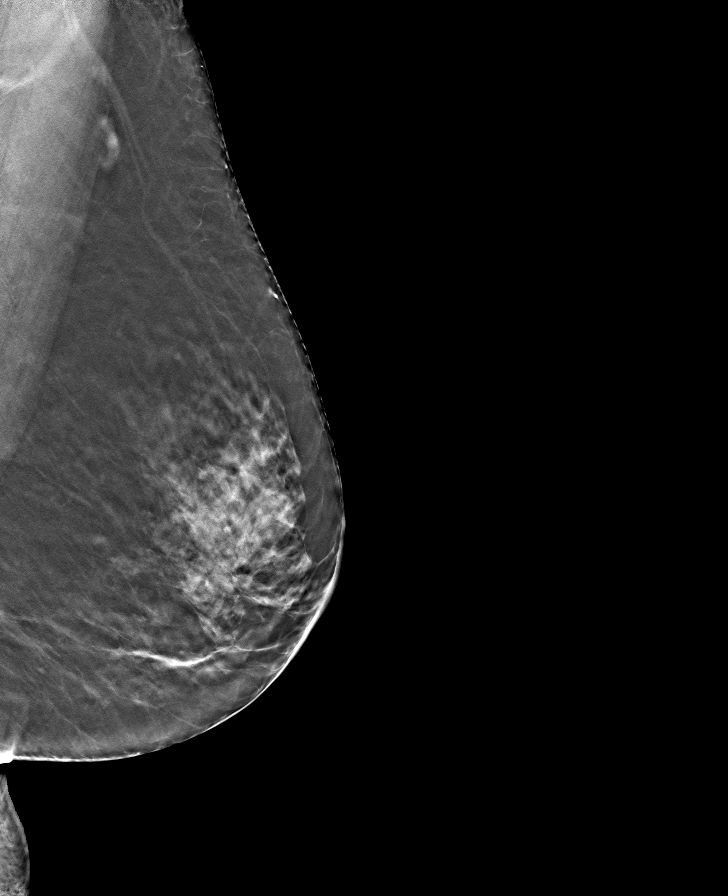

[R CC tomo · tomo slice 31/60.0]
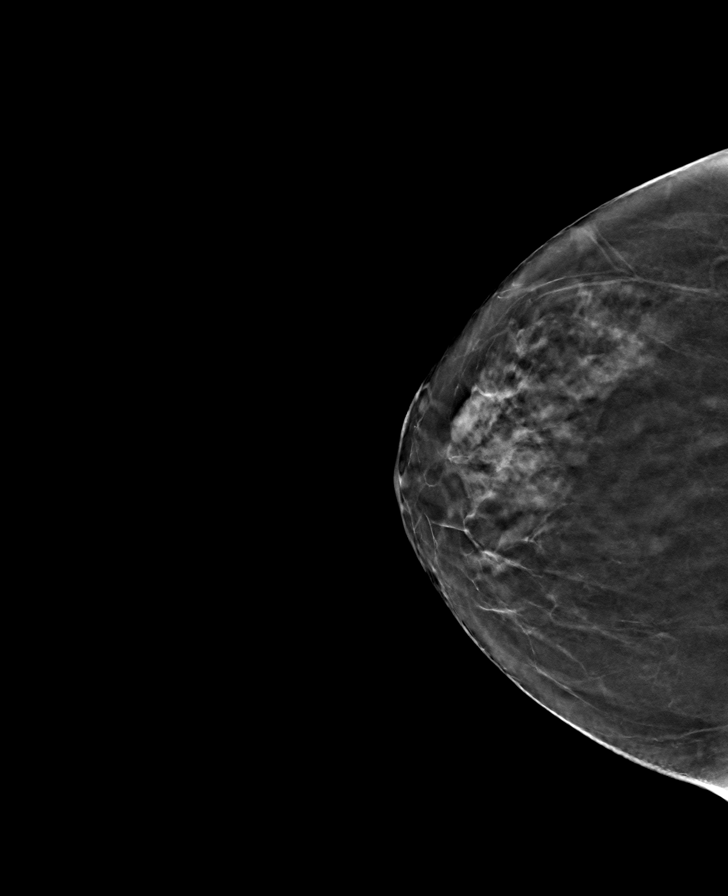

[L CC tomo · tomo slice 30/59.0]
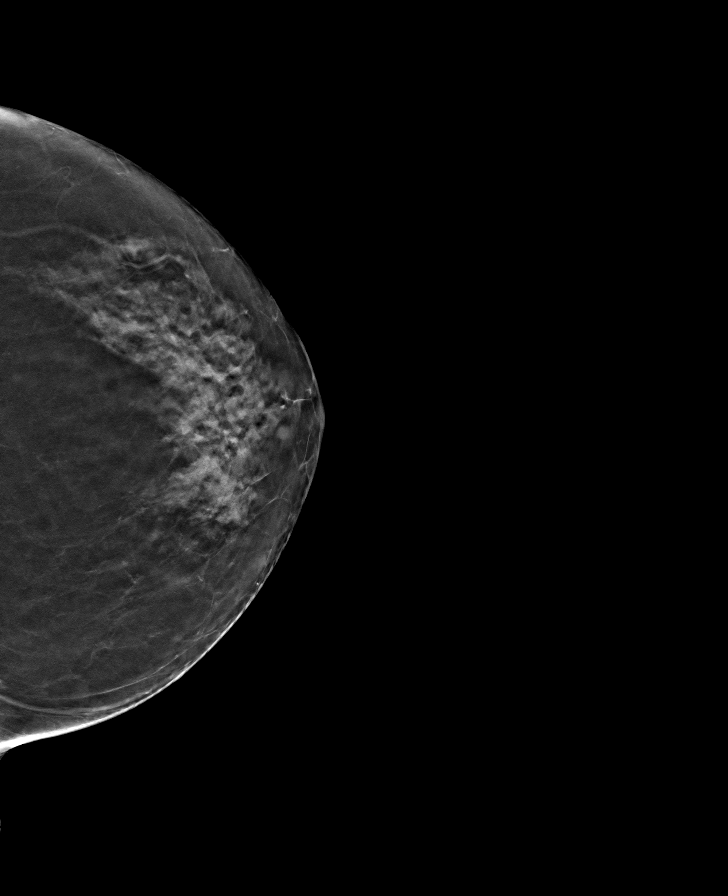

[8 of 24 positions shown; findings below may reference images not displayed]

ACR Breast Density Category c: The breast tissue is heterogeneously
dense, which may obscure small masses.
FINDINGS: There are no findings suspicious for malignancy.
IMPRESSION: No mammographic evidence of malignancy. A result letter of this
screening mammogram will be mailed directly to the patient.

RECOMMENDATION:
Screening mammogram in one year. (Code:[V2])

BI-RADS CATEGORY  1: Negative.

## 2021-07-19 ENCOUNTER — Ambulatory Visit (INDEPENDENT_AMBULATORY_CARE_PROVIDER_SITE_OTHER): Payer: Medicare Other

## 2021-07-19 ENCOUNTER — Ambulatory Visit
Admission: EM | Admit: 2021-07-19 | Discharge: 2021-07-19 | Disposition: A | Discharge status: Home / Self Care | Payer: Medicare Other | Attending: Emergency Medicine | Admitting: Emergency Medicine

## 2021-07-19 DIAGNOSIS — S40011A Contusion of right shoulder, initial encounter: Secondary | ICD-10-CM | POA: Insufficient documentation

## 2021-07-19 DIAGNOSIS — M25511 Pain in right shoulder: Secondary | ICD-10-CM | POA: Diagnosis not present

## 2021-07-19 LAB — CBC WITH DIFFERENTIAL/PLATELET
Abs Immature Granulocytes: 0.02 10*3/uL (ref 0.00–0.07)
Basophils Absolute: 0 10*3/uL (ref 0.0–0.1)
Basophils Relative: 1 %
Eosinophils Absolute: 0.2 10*3/uL (ref 0.0–0.5)
Eosinophils Relative: 3 %
HCT: 38.4 % (ref 36.0–46.0)
Hemoglobin: 12.6 g/dL (ref 12.0–15.0)
Immature Granulocytes: 0 %
Lymphocytes Relative: 25 %
Lymphs Abs: 1.6 10*3/uL (ref 0.7–4.0)
MCH: 28.7 pg (ref 26.0–34.0)
MCHC: 32.8 g/dL (ref 30.0–36.0)
MCV: 87.5 fL (ref 80.0–100.0)
Monocytes Absolute: 0.6 10*3/uL (ref 0.1–1.0)
Monocytes Relative: 9 %
Neutro Abs: 4.1 10*3/uL (ref 1.7–7.7)
Neutrophils Relative %: 62 %
Platelets: 361 10*3/uL (ref 150–400)
RBC: 4.39 MIL/uL (ref 3.87–5.11)
RDW: 14.1 % (ref 11.5–15.5)
WBC: 6.4 10*3/uL (ref 4.0–10.5)
nRBC: 0 % (ref 0.0–0.2)

## 2021-07-19 IMAGING — CR DG SHOULDER 2+V*R*
4 series · 4 of 4 positions shown · non-contrast
Comparison: None.

CLINICAL DATA: Patient is here for Right shoulder "knot" with
bruising on top of shoulder humerus area. No pain with it "sore
maybe when moving it at times". No injury.

EXAM:
RIGHT SHOULDER - 2+ VIEW

[shoulder grashey]
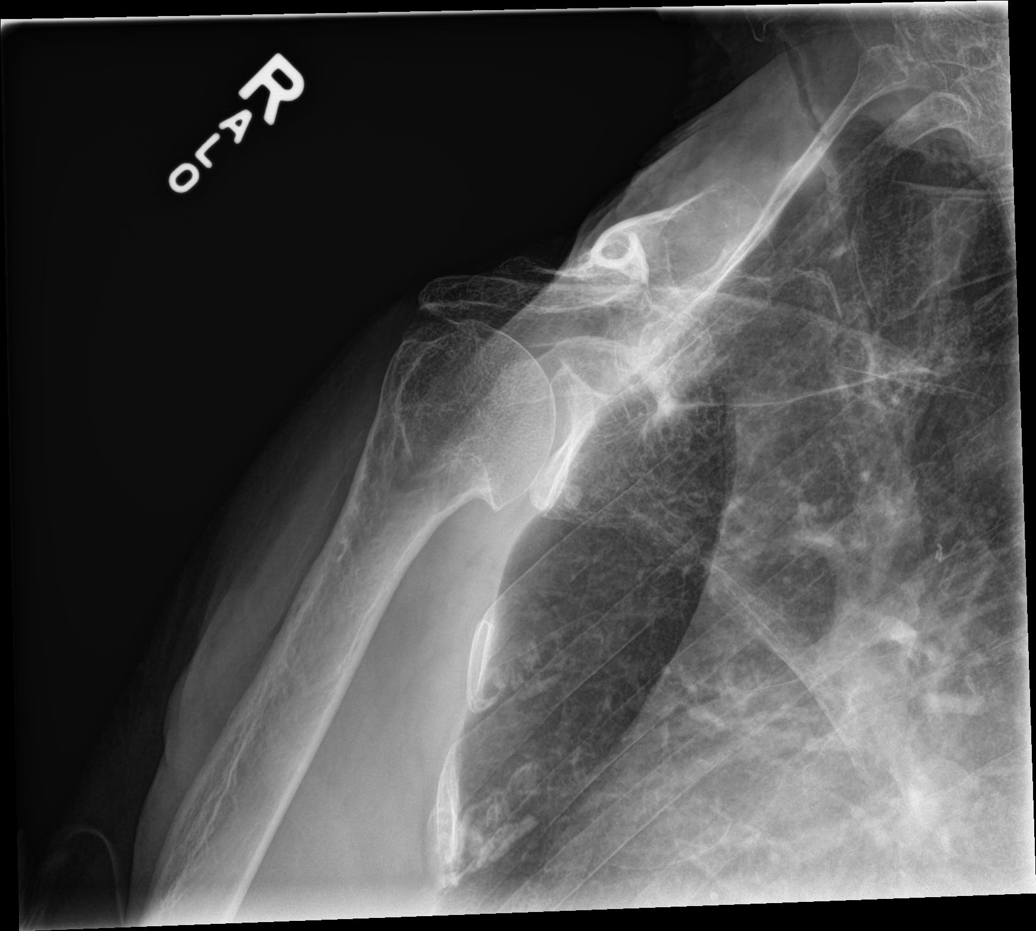

[shoulder y view (1 of 2)]
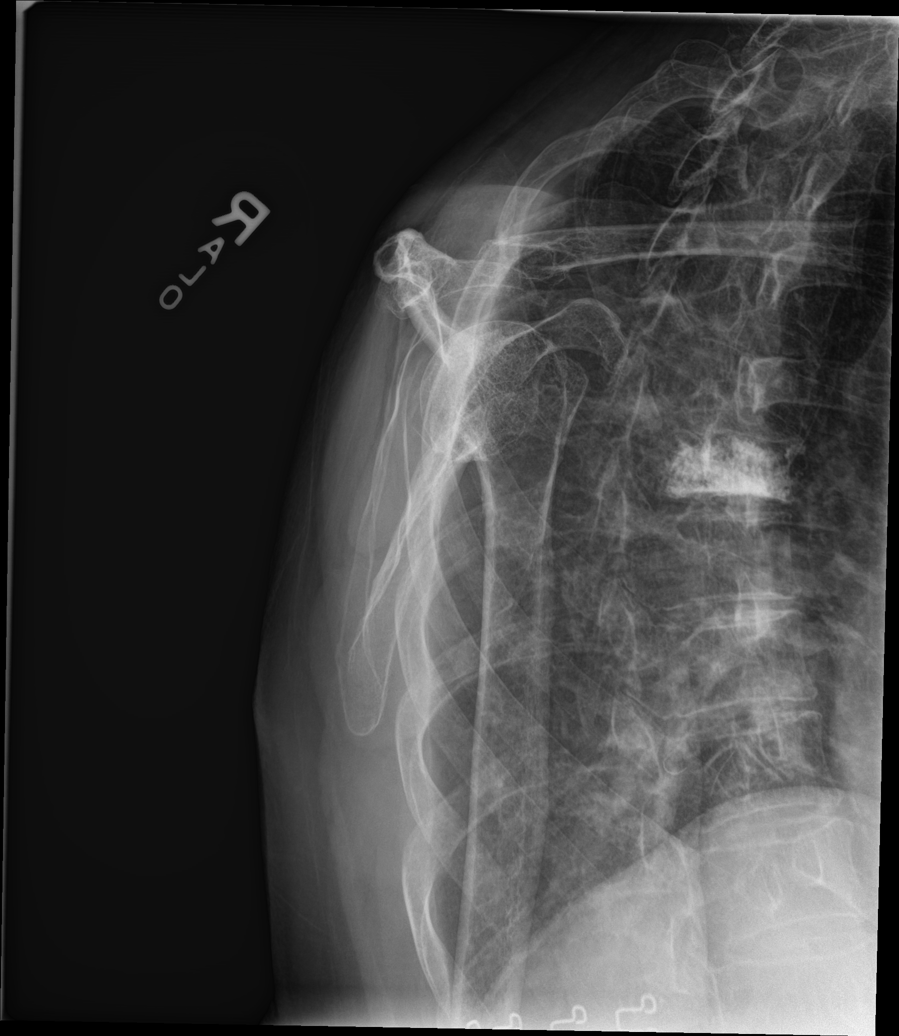

[shoulder axial]
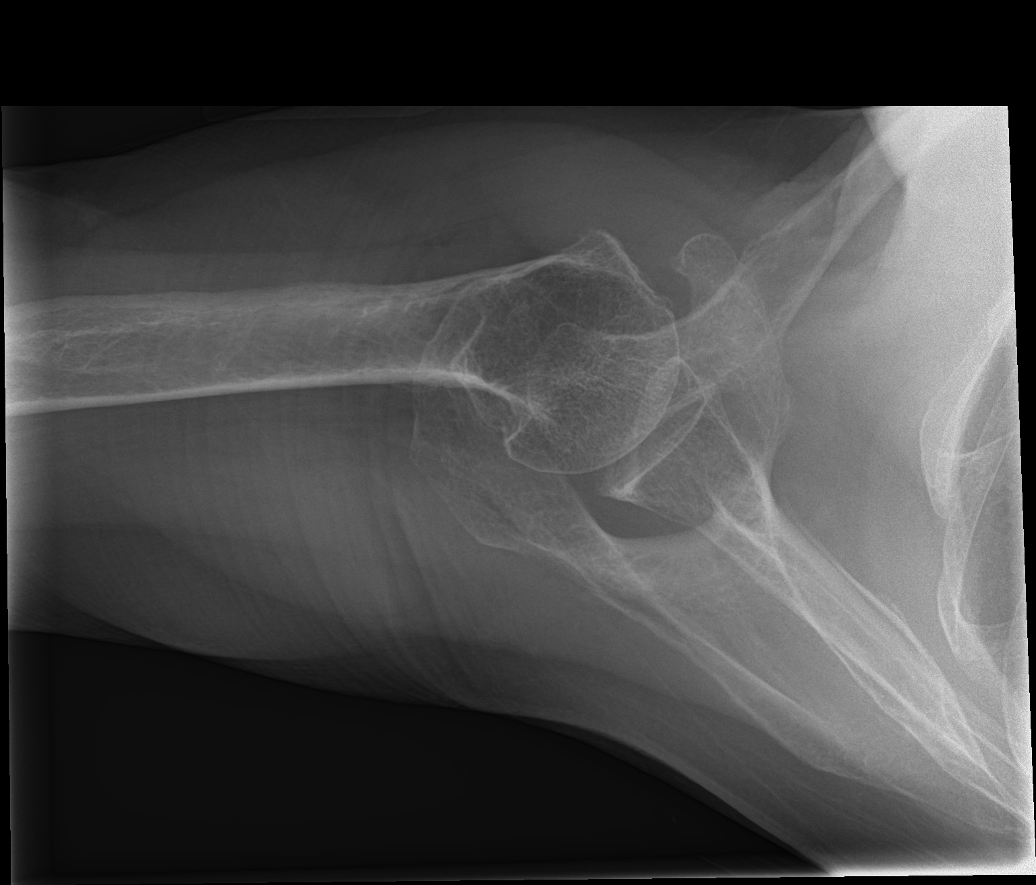

[shoulder y view (2 of 2)]
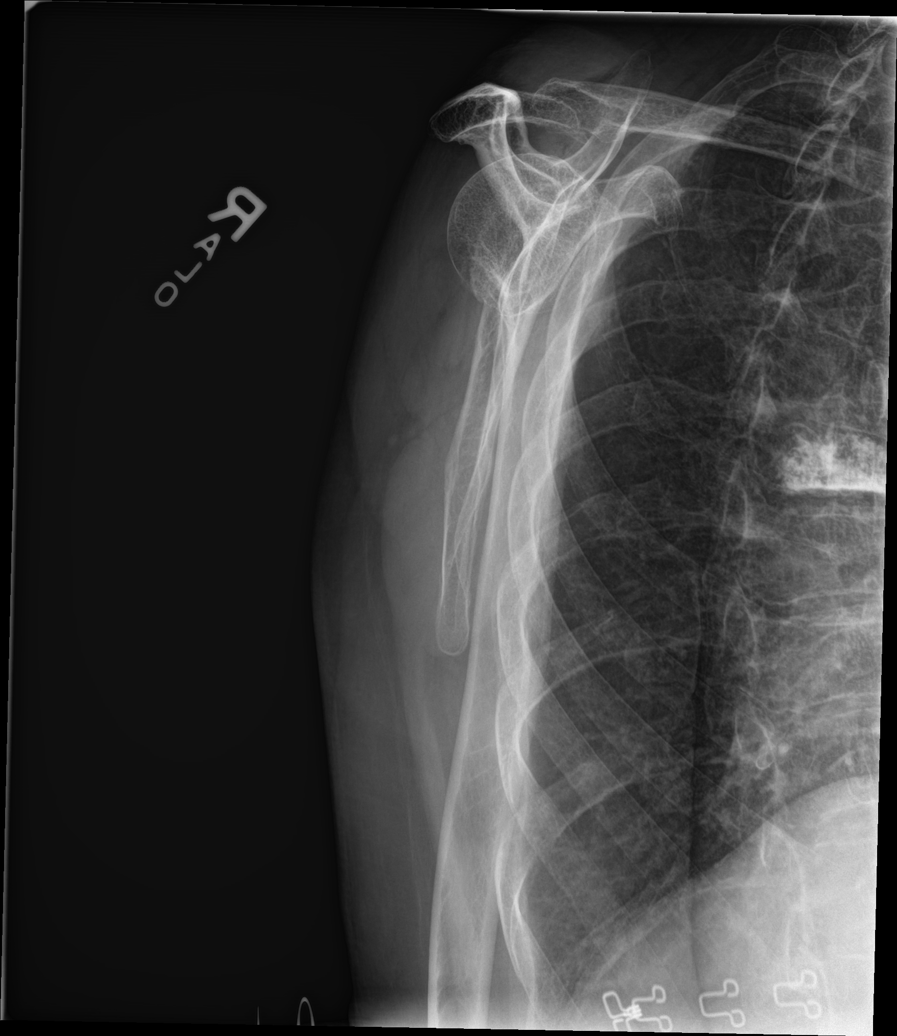

[4 of 4 positions shown; findings below may reference images not displayed]

FINDINGS: Humeral head appears appropriately positioned relative to the
glenoid fossa. There is significant narrowing of the
acromion-humeral space. No fracture line or displaced fracture
fragment is seen. No acute-appearing cortical irregularity or
osseous lesion.

Mild degenerative spurring at the glenohumeral joint space and
acromioclavicular joint space. Soft tissues about the RIGHT shoulder
are unremarkable.
IMPRESSION: 1. No acute findings.
2. Significant narrowing of the acromion-humeral space, suggesting
chronic rotator cuff tear.
3. Mild degenerative spurring at the glenohumeral and
acromioclavicular joint space.

## 2021-07-19 NOTE — Discharge Instructions (Signed)
No abnormalities in your blood work today which assessed your blood levels ? ?X-ray of your shoulder showed a chronic rotator cuff tear and mild bone spur to your acromioclavicular joint which is where the knot is at ? ?Causing of your bruising is unknown at this time therefore we will move forward with treating the symptoms, typically bruises will heal on their own with time ? ?You may place ice in 10 to 15-minute intervals over the affected area to help reduce swelling and for your tenderness ? ?May use Tylenol or ibuprofen for your discomfort ? ?You may follow-up with your primary care doctor as needed or if new bruising occurs ? ? ?

## 2021-07-19 NOTE — ED Triage Notes (Signed)
Patient is here for Right shoulder "knot" with bruising. No pain with it "sore maybe when moving it at times". No injury.  ?

## 2021-07-19 NOTE — ED Provider Notes (Signed)
?[facility] ? ? ? ?CSN: [id number] ?Arrival date & time: 07/19/21  1856 ? ? ?  ? ?History   ?Chief Complaint ?Chief Complaint  ?Patient presents with  ? Right Shoulder (problem)  ? ? ?HPI ?Olivia Alvarado is a 71 y.o. female.  ? ?Patient presents with a knot and bruising to her right shoulder noticed 1 day ago.  Denies precipitating event, injury or trauma.  Area is tender with movement and with palpation.  Range of motion of shoulder is intact.  Denies numbness or tingling.  Patient has had spontaneous bruising in the past at that time she was noted to be iron deficiency anemic, levels monitored no medical intervention.  ? ?Past Medical History:  ?Diagnosis Date  ? Atrophic vaginitis   ? History of kidney stones   ? IBS (irritable bowel syndrome)   ? Lower back pain   ? Nocturia   ? OAB (overactive bladder)   ? Pre-diabetes   ? Skin cancer   ? BCC  ? Squamous carcinoma   ? Suprapubic pressure   ? Urethral stricture   ? Urinary frequency   ? Urinary incontinence, nocturnal enuresis   ? Vaginal atrophy   ? Vulvar lesion   ? ? ?Patient Active Problem List  ? Diagnosis Date Noted  ? Trochanteric bursitis of left hip 02/10/2021  ? Tear of left gluteus minimus tendon 02/10/2021  ? Hyponatremia 02/09/2021  ? Greater trochanteric bursitis of left hip 02/09/2021  ? Compression fracture of lumbar vertebra, closed, initial encounter ([place]) 08/22/2020  ? Irregular heart beat 08/22/2020  ? Peripheral edema 08/22/2020  ? Urinary tract infection symptoms 01/03/2019  ? Postprandial abdominal bloating 10/03/2016  ? Microscopic hematuria 08/02/2015  ? Pelvic pain in female 08/02/2015  ? Mild episode of recurrent major depressive disorder ([place]) 03/12/2015  ? Diabetes mellitus type 2, uncomplicated ([place]) 31/49/7026  ? Hypertension 12/04/2013  ? IBS (irritable bowel syndrome) 12/04/2013  ? ? ?Past Surgical History:  ?Procedure Laterality Date  ? COLONOSCOPY    ? KYPHOPLASTY N/A 09/15/2020  ? Procedure: T6, T12 Kyphoplasty;   Surgeon: Hessie Knows, MD;  Location: [facility] ORS;  Service: Orthopedics;  Laterality: N/A;  ? SKIN CANCER EXCISION    ? ? ?OB History   ?No obstetric history on file. ?  ? ? ? ?Home Medications   ? ?Prior to Admission medications   ?Medication Sig Start Date End Date Taking? Authorizing Provider  ?atorvastatin (LIPITOR) 10 MG tablet Take 1 tablet by mouth daily. 07/26/20  Yes [provider]  ?Cholecalciferol 25 MCG (1000 UT) tablet Take 1,000 Units by mouth daily.   Yes [provider]  ?FLUoxetine (PROZAC) 10 MG tablet Take 10 mg by mouth daily. 05/18/21  Yes [provider]  ?HYDROcodone-acetaminophen (NORCO) 5-325 MG tablet Take 1 tablet by mouth every 6 (six) hours as needed for moderate pain. ?Patient not taking: No sig reported 09/15/20   Hessie Knows, MD  ?loratadine (CLARITIN) 10 MG tablet Take by mouth.    [provider]  ? ? ?Family History ?Family History  ?Problem Relation Age of Onset  ? Breast cancer Maternal Grandmother 44  ? Colon cancer Maternal Grandmother 75  ? Colon cancer Paternal Grandmother 45  ? Kidney cancer Mother 18  ? Rectal cancer Mother 73  ? Ovarian cancer Mother 1  ? Colon cancer Maternal Uncle 66  ? Colon cancer Other 43  ?     Paternal Great Aunt  ? Colon cancer Other 104  ?  Paternal Great Aunt  ? Arthritis Other   ?     mother, Father  ? Diabetes Other   ? Hypertension Other   ? Heart disease Other   ?     Mother, Father  ? Osteoporosis Other   ? ? ?Social History ?Social History  ? ?Tobacco Use  ? Smoking status: Some Days  ?  Packs/day: 0.50  ?  Years: 40.00  ?  Pack years: 20.00  ?  Types: Cigarettes, E-cigarettes  ? Smokeless tobacco: Never  ?Vaping Use  ? Vaping Use: Every day  ? Substances: Nicotine  ?Substance Use Topics  ? Alcohol use: No  ? Drug use: No  ? ? ? ?Allergies   ?Rosuvastatin ? ? ?Review of Systems ?Review of Systems ?Defer to HPI  ? ? ?Physical Exam ?Triage Vital Signs ?ED Triage Vitals  ?Enc Vitals Group  ?   BP 07/19/21  0950 (!) 145/81  ?   Pulse Rate 07/19/21 0950 79  ?   Resp 07/19/21 0950 18  ?   Temp 07/19/21 0950 98 ?F (36.7 ?C)  ?   Temp Source 07/19/21 0950 Oral  ?   SpO2 07/19/21 0950 97 %  ?   Weight 07/19/21 0948 140 lb (63.5 kg)  ?   Height 07/19/21 0948 '5\' 2"'$  (1.575 m)  ?   Head Circumference --   ?   Peak Flow --   ?   Pain Score 07/19/21 0948 0  ?   Pain Loc --   ?   Pain Edu? --   ?   Excl. in [place]? --   ? ?No data found. ? ?Updated Vital Signs ?BP (!) 145/81 (BP Location: Right Arm)   Pulse 79   Temp 98 ?F (36.7 ?C) (Oral)   Resp 18   Ht '5\' 2"'$  (1.575 m)   Wt 140 lb (63.5 kg)   SpO2 97%   BMI 25.61 kg/m?  ? ?Visual Acuity ?Right Eye Distance:   ?Left Eye Distance:   ?Bilateral Distance:   ? ?Right Eye Near:   ?Left Eye Near:    ?Bilateral Near:    ? ?Physical Exam ?Constitutional:   ?   Appearance: Normal appearance.  ?HENT:  ?   Head: Normocephalic.  ?Eyes:  ?   Extraocular Movements: Extraocular movements intact.  ?Pulmonary:  ?   Effort: Pulmonary effort is normal.  ?Musculoskeletal:  ?   Comments: Cyst and bruising present to the distal aspect of the scapula, tenderness over bruising, no swelling or deformity noted, range of motion of shoulder intact, negative Hawkins sign, strength 5/5  ?Neurological:  ?   Mental Status: She is alert and oriented to person, place, and time. Mental status is at baseline.  ?Psychiatric:     ?   Mood and Affect: Mood normal.     ?   Behavior: Behavior normal.  ? ? ? ?UC Treatments / Results  ?Labs ?(all labs ordered are listed, but only abnormal results are displayed) ?Labs Reviewed  ?CBC WITH DIFFERENTIAL/PLATELET  ? ? ?EKG ? ? ?Radiology ?No results found. ? ?Procedures ?Procedures (including critical care time) ? ?Medications Ordered in UC ?Medications - No data to display ? ?Initial Impression / Assessment and Plan / UC Course  ?I have reviewed the triage vital signs and the nursing notes. ? ?Pertinent labs & imaging results that were available during my care of the  patient were reviewed by me and considered in my medical decision making (see chart  for details). ? ?Contusion to the right shoulder, initial encounter ? ?Unknown cause of contusion, CBC within normal limits, discussed with patient, shoulder x-ray showing chronic rotator cuff injury and as per to the [facility] joint, discussed with patient, lesion noted on exam is most likely the but should not be the cause of bruising, advised patient to monitor, may use ice over the affected area to help bruising and tenderness, recommended follow-up with PCP for reevaluation as needed or for recurrence of new bruising ?Final Clinical Impressions(s) / UC Diagnoses  ? ?Final diagnoses:  ?None  ? ?Discharge Instructions   ?None ?  ? ?ED Prescriptions   ?None ?  ? ?PDMP not reviewed this encounter. ?  ?Hans Eden, NP ?07/19/21 1110 ? ?

## 2021-08-03 ENCOUNTER — Other Ambulatory Visit: Payer: Self-pay | Admitting: Orthopedic Surgery

## 2021-08-03 DIAGNOSIS — M25311 Other instability, right shoulder: Secondary | ICD-10-CM

## 2021-08-03 DIAGNOSIS — S46001A Unspecified injury of muscle(s) and tendon(s) of the rotator cuff of right shoulder, initial encounter: Secondary | ICD-10-CM

## 2021-08-03 DIAGNOSIS — M85611 Other cyst of bone, right shoulder: Secondary | ICD-10-CM

## 2021-08-17 ENCOUNTER — Ambulatory Visit
Admission: RE | Admit: 2021-08-17 | Discharge: 2021-08-17 | Disposition: A | Discharge status: Home / Self Care | Payer: Medicare Other | Source: Ambulatory Visit | Attending: Orthopedic Surgery | Admitting: Orthopedic Surgery

## 2021-08-17 DIAGNOSIS — M25311 Other instability, right shoulder: Secondary | ICD-10-CM | POA: Diagnosis present

## 2021-08-17 DIAGNOSIS — S46001A Unspecified injury of muscle(s) and tendon(s) of the rotator cuff of right shoulder, initial encounter: Secondary | ICD-10-CM | POA: Diagnosis present

## 2021-08-17 DIAGNOSIS — M85611 Other cyst of bone, right shoulder: Secondary | ICD-10-CM | POA: Insufficient documentation

## 2021-08-17 IMAGING — MR MR SHOULDER*R* W/O CM
6 series · 40 of 40 positions shown · non-contrast
Comparison: None Available.

CLINICAL DATA: No injury. Right shoulder not with bruising on top
of the shoulder/humerus.

EXAM:
MRI OF THE RIGHT SHOULDER WITHOUT CONTRAST
TECHNIQUE: Multiplanar, multisequence MR imaging of the shoulder was performed.
No intravenous contrast was administered.

[Series 3: T2 fat-sat · axial · right · 4.0mm · 0.44mm/px · z∈[-49,+71]mm · 8 of 26 slices shown (1 of 3)]
[im 1/26]
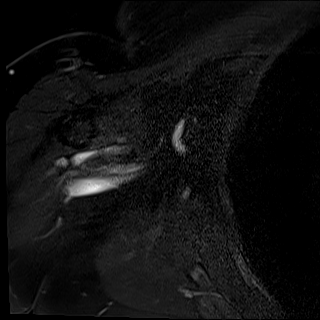
[im 4/26]
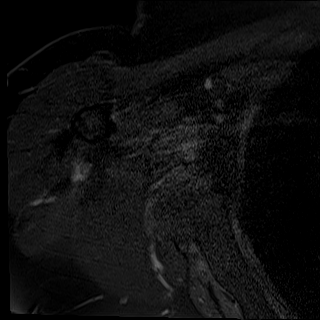
[im 8/26]
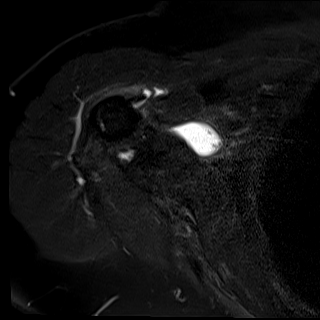
[im 11/26]
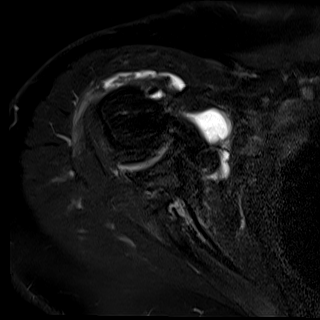
[im 15/26]
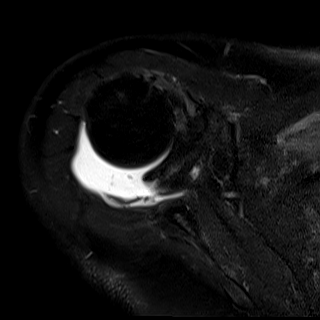
[im 18/26]
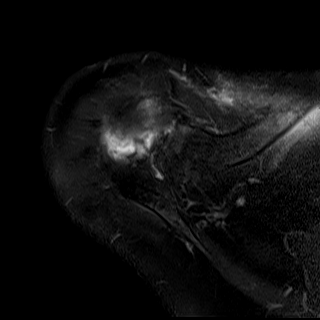
[im 22/26]
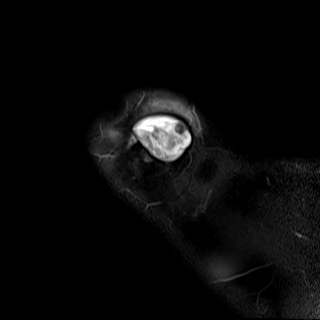
[im 26/26]
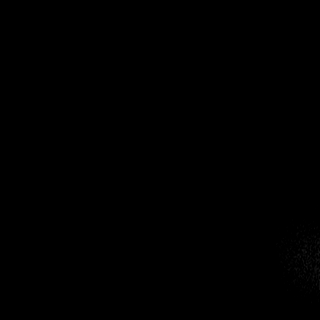

[Series 4: T1 · axial · right · 4.0mm · 0.44mm/px · z∈[-49,+71]mm · 8 of 26 slices shown (1 of 2)]
[im 1/26]
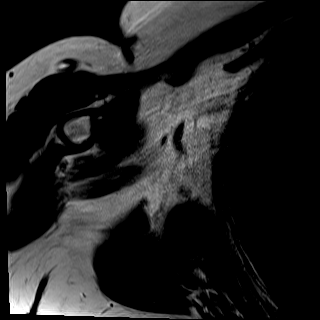
[im 4/26]
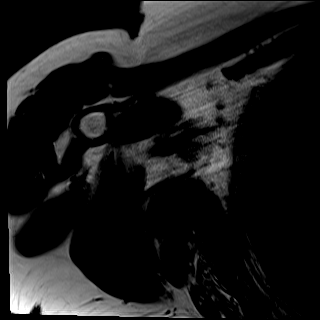
[im 8/26]
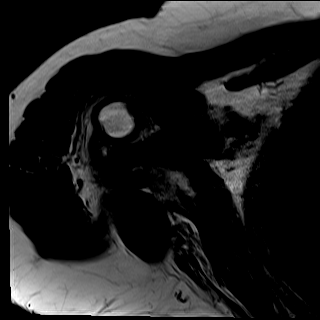
[im 11/26]
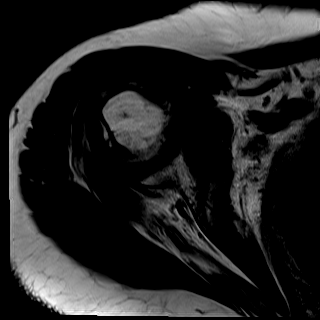
[im 15/26]
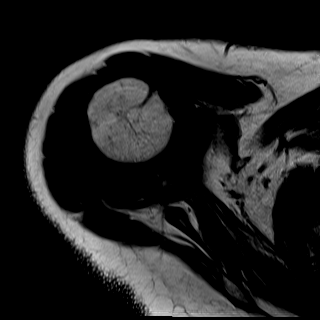
[im 18/26]
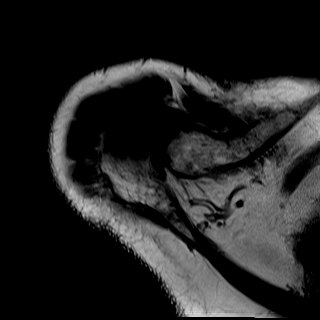
[im 22/26]
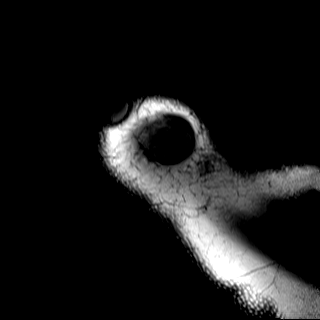
[im 26/26]
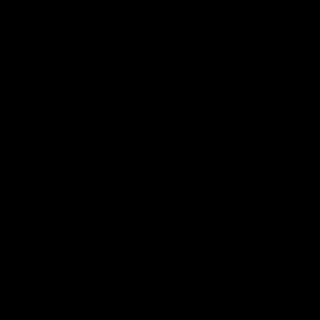

[Series 5: PD · oblique · right · 4.0mm · 0.44mm/px · 6 of 22 slices shown]
[im 1/22]
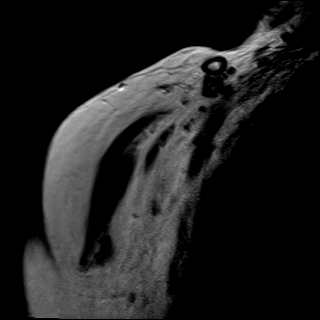
[im 5/22]
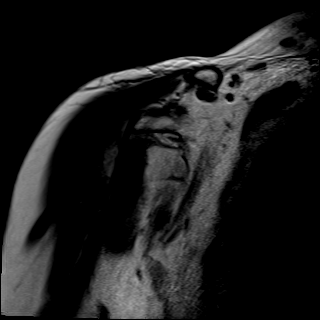
[im 9/22]
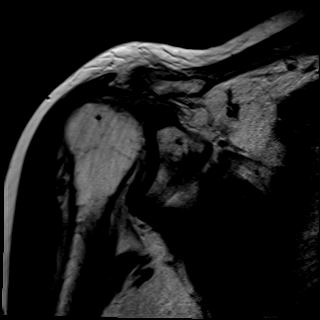
[im 13/22]
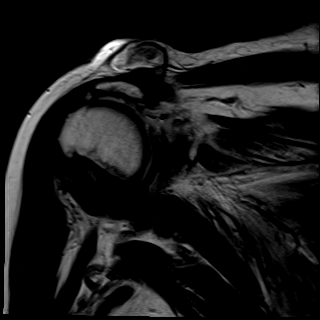
[im 17/22]
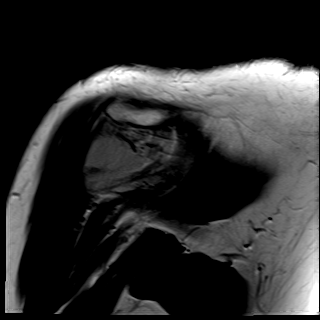
[im 22/22]
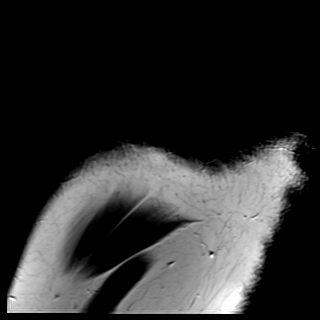

[Series 6: T2 fat-sat · oblique · right · 4.0mm · 0.44mm/px · 6 of 22 slices shown (2 of 3)]
[im 1/22]
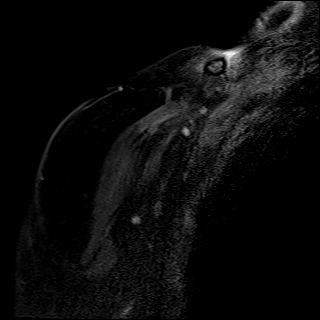
[im 5/22]
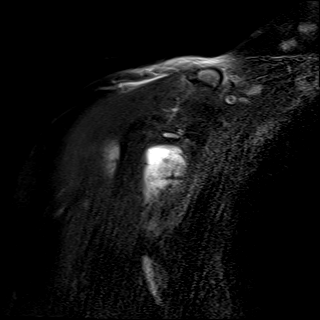
[im 9/22]
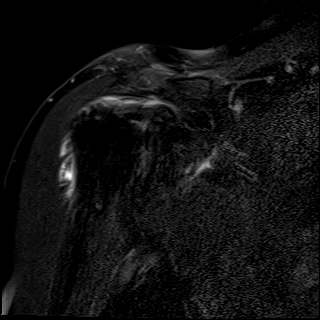
[im 13/22]
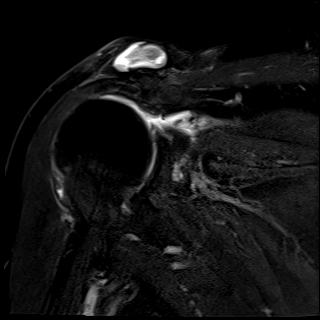
[im 17/22]
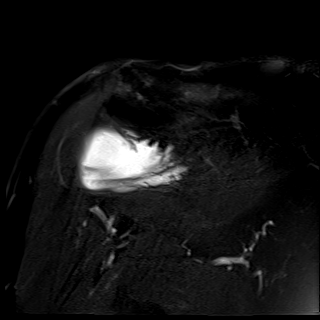
[im 22/22]
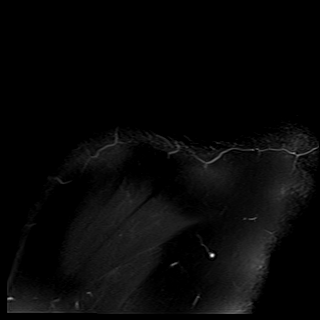

[Series 7: T1 · oblique · right · 4.0mm · 0.44mm/px · 6 of 22 slices shown (2 of 2)]
[im 1/22]
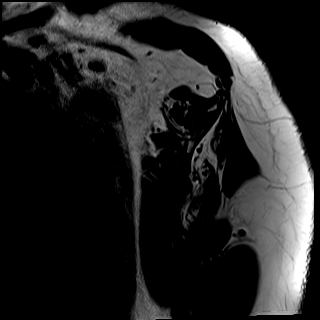
[im 5/22]
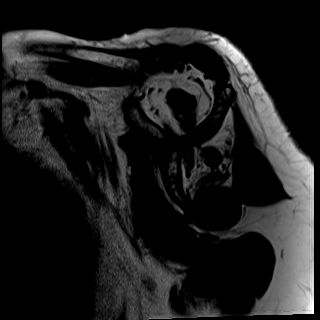
[im 9/22]
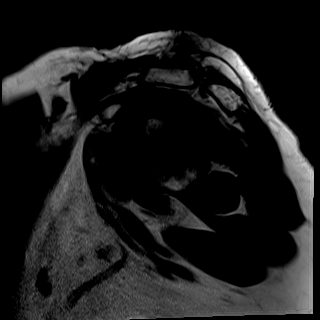
[im 13/22]
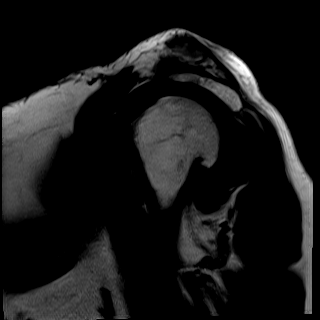
[im 17/22]
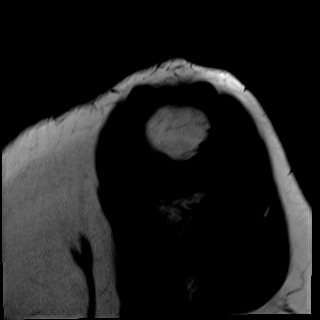
[im 22/22]
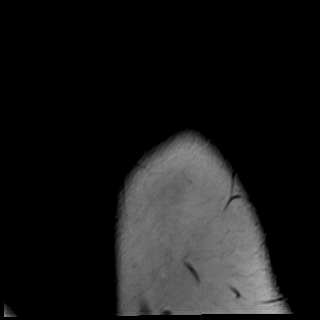

[Series 8: T2 fat-sat · oblique · right · 4.0mm · 0.55mm/px · 6 of 22 slices shown (3 of 3)]
[im 1/22]
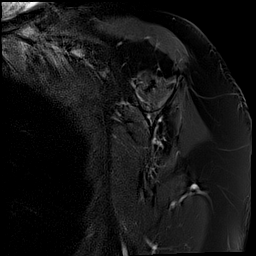
[im 5/22]
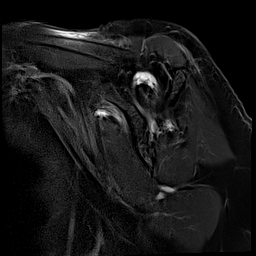
[im 9/22]
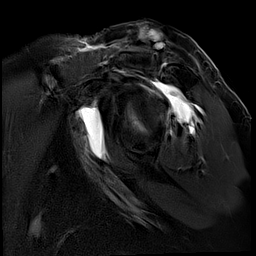
[im 13/22]
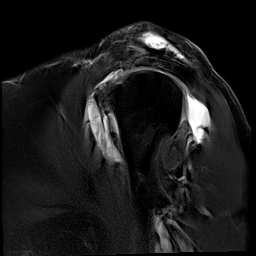
[im 17/22]
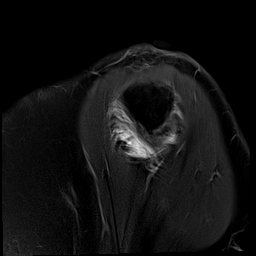
[im 22/22]
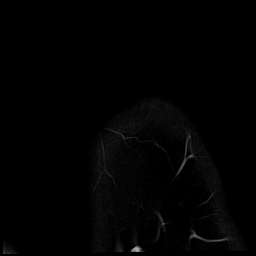

[40 of 40 positions shown; findings below may reference images not displayed]

FINDINGS: Rotator cuff: Chronic complete tear of the supraspinatus tendon with
tendon retraction. Chronic complete tear of the infraspinatus tendon
with tendon retraction. Tendinopathy of the infraspinatus without
tear. Tendinopathy of the subscapularis with partial-thickness
insertional tear.

Muscles: Moderate to severe fatty infiltration of the infraspinatus
and mild fatty infiltration of the supraspinatus. No intramuscular
hematoma.

Biceps Long Head: Advanced tendinopathy of the intra-articular long
head of the biceps. The biceps labral anchor appear intact.

Acromioclavicular Joint: Moderate arthropathy of the
acromioclavicular joint. Moderate subacromial/subdeltoid bursal
fluid.

Glenohumeral Joint: Moderate joint effusion. Near complete loss of
the articular cartilage with remodeling of the glenoid.

Labrum: Advanced degeneration/maceration of the labrum.

Bones: No fracture or dislocation. No aggressive osseous lesion.

Other: There is a subcutaneous fluid collection about the superior
aspect of the acromioclavicular joint measuring approximately 1.6 x
1.1 x 2.4 cm.
IMPRESSION: 1. Chronic complete tear of the supraspinatus and infraspinatus
tendons with tendon retraction and atrophy of the infraspinatus and
supraspinatus muscles.

2. Tendinopathy of the subscapularis and intra-articular long head
of the biceps. No full-thickness tear.

3.  Advanced acromioclavicular and glenohumeral osteoarthritis.

4. Large reactive glenohumeral joint effusion with fluid in the
axillary recess.

5. Complex subcutaneous fluid collection about the superior aspect
of the acromioclavicular joint measuring 1.6 x 1.1 x 2.4 cm, likely
joint fluid, sequela of chronic rotator cuff arthropathy and
glenohumeral/acromioclavicular osteoarthritis (Geyser sign).

## 2021-12-24 ENCOUNTER — Ambulatory Visit
Admission: EM | Admit: 2021-12-24 | Discharge: 2021-12-24 | Disposition: A | Discharge status: Home / Self Care | Payer: Medicare Other | Attending: Emergency Medicine | Admitting: Emergency Medicine

## 2021-12-24 DIAGNOSIS — H6121 Impacted cerumen, right ear: Secondary | ICD-10-CM

## 2021-12-24 DIAGNOSIS — L089 Local infection of the skin and subcutaneous tissue, unspecified: Secondary | ICD-10-CM

## 2021-12-24 MED ORDER — CEPHALEXIN 500 MG PO CAPS: 1000.0000 mg | ORAL_CAPSULE | Freq: Two times a day (BID) | ORAL | 0 refills | Status: AC

## 2021-12-24 NOTE — ED Provider Notes (Signed)
HPI  SUBJECTIVE:  Olivia Alvarado is a 71 y.o. female who presents with 2 issues: First, she reports 1 week of right ear fullness, decreased hearing.  She states that her ear is popping, but this does not change her hearing.  She reports nasal congestion, rhinorrhea, watery eyes.  No otorrhea, ear pain.  No recent swimming, foreign body insertion.  She tried hydrogen peroxide which helped her symptoms for a day.  No aggravating factors.  Second, she reports a nonpruritic, painless, nontender rash on her left breast starting 2 days ago.  She denies preceding paresthesias/pain, pain wrapping to her back.  No new lotions, soaps, detergents, change in her medications.  She does not recall any trauma to the area.  No known insect bite.  No purulent discharge or other drainage.  No contacts with similar rash.  She has never had symptoms like this before.  The rash has not changed since it started.  She has not tried anything for this.  No aggravating or alleviating factors.  She has a past medical history of diabetes and varicella.  No history of shingles, MRSA, chronic kidney disease.  PCP: [facility].    Past Medical History:  Diagnosis Date   Atrophic vaginitis    History of kidney stones    IBS (irritable bowel syndrome)    Lower back pain    Nocturia    OAB (overactive bladder)    Pre-diabetes    Skin cancer    BCC   Squamous carcinoma    Suprapubic pressure    Urethral stricture    Urinary frequency    Urinary incontinence, nocturnal enuresis    Vaginal atrophy    Vulvar lesion     Past Surgical History:  Procedure Laterality Date   COLONOSCOPY     KYPHOPLASTY N/A 09/15/2020   Procedure: T6, T12 Kyphoplasty;  Surgeon: Hessie Knows, MD;  Location: [facility] ORS;  Service: Orthopedics;  Laterality: N/A;   SKIN CANCER EXCISION      Family History  Problem Relation Age of Onset   Breast cancer Maternal Grandmother 63   Colon cancer Maternal Grandmother 5   Colon cancer  Paternal Grandmother 84   Kidney cancer Mother 50   Rectal cancer Mother 27   Ovarian cancer Mother 47   Colon cancer Maternal Uncle 41   Colon cancer Other 69       Paternal Great Aunt   Colon cancer Other 23       Paternal Great Aunt   Arthritis Other        mother, Father   Diabetes Other    Hypertension Other    Heart disease Other        Mother, Father   Osteoporosis Other     Social History   Tobacco Use   Smoking status: Some Days    Packs/day: 0.50    Years: 40.00    Total pack years: 20.00    Types: Cigarettes, E-cigarettes   Smokeless tobacco: Never  Vaping Use   Vaping Use: Every day   Substances: Nicotine  Substance Use Topics   Alcohol use: No   Drug use: No    No current facility-administered medications for this encounter.  Current Outpatient Medications:    atorvastatin (LIPITOR) 10 MG tablet, Take 1 tablet by mouth daily., Disp: , Rfl:    cephALEXin (KEFLEX) 500 MG capsule, Take 2 capsules (1,000 mg total) by mouth 2 (two) times daily for 5 days., Disp: 20 capsule, Rfl:  0   Cholecalciferol 25 MCG (1000 UT) tablet, Take 1,000 Units by mouth daily., Disp: , Rfl:    FLUoxetine (PROZAC) 10 MG tablet, Take 10 mg by mouth daily., Disp: , Rfl:    loratadine (CLARITIN) 10 MG tablet, Take by mouth., Disp: , Rfl:   Allergies  Allergen Reactions   Rosuvastatin Other (See Comments)    Other reaction(s): Muscle Pain     ROS  As noted in HPI.   Physical Exam  BP (!) 161/91 (BP Location: Left Arm)   Pulse 79   Temp 97.9 F (36.6 C) (Oral)   Ht '5\' 2"'$  (1.575 m)   Wt 63 kg   SpO2 96%   BMI 25.42 kg/m   Constitutional: Well developed, well nourished, no acute distress Eyes:  EOMI, conjunctiva normal bilaterally HENT: Normocephalic, atraumatic,mucus membranes moist.  Left TM normal.  Decreased hearing right ear.  Cerumen impaction right ear.  No pain with traction on pinna, palpation of tragus or mastoid. Neck: No cervical  lymphadenopathy Respiratory: Normal inspiratory effort Cardiovascular: Normal rate GI: nondistended Skin: Nontender rash left breast with surrounding erythema, but no induration, expressible purulent drainage.  No other rash or tenderness noted in a dermatomal distribution.     Musculoskeletal: no deformities Neurologic: Alert & oriented x 3, no focal neuro deficits Psychiatric: Speech and behavior appropriate   ED Course   Medications - No data to display  Orders Placed This Encounter  Procedures   Ear wax removal    Right  only    Standing Status:   Standing    Number of Occurrences:   1    No results found for this or any previous visit (from the past 24 hour(s)). No results found.  ED Clinical Impression  1. Hearing loss due to cerumen impaction, right   2. Local skin infection      ED Assessment/Plan    Calculated creatinine clearance from labs done in May 23 72 mL/minute  1.  Ear fullness.  Suspect cerumen impaction.  Will have this ear irrigated and then reevaluate.  On reevaluation, patient states that she feels better.  Hearing is greatly improved and equal in both ears.  Slightly erythematous EAC which I expect due to irrigation, but TM intact, normal.  2.  Left breast rash.  No evidence of shingles.  Suspect staph or strep infection.  Will send home with Keflex 1000 mg twice daily for 5 days.  Follow-up with PCP or here if not getting better in 3 days, sooner if she gets worse, and then we can change her antibiotics.  Discussed MDM, treatment plan, and plan for follow-up with patient. patient agrees with plan.   Meds ordered this encounter  Medications   cephALEXin (KEFLEX) 500 MG capsule    Sig: Take 2 capsules (1,000 mg total) by mouth 2 (two) times daily for 5 days.    Dispense:  20 capsule    Refill:  0      *This clinic note was created using [employer]. Therefore, there may be occasional mistakes despite careful  proofreading.  ?Melynda Ripple, MD 12/24/21 1122

## 2021-12-24 NOTE — ED Triage Notes (Signed)
Pt states she feels like water in RT ear x1 week   Pt also here for rash LT breast x couple days, rash not itchy

## 2021-12-24 NOTE — Discharge Instructions (Addendum)
Finish the antibiotics, even if you feel better.  Keep this clean with soap and water.  Follow-up with your doctor or here if not getting any better in 3 days, sooner if you get worse.  Your symptoms were caused by wax buildup.  There is no evidence of a external ear or inner ear infection.  You can use Debrox to help prevent wax buildup in the future.

## 2022-02-01 ENCOUNTER — Other Ambulatory Visit: Payer: Self-pay | Admitting: Family Medicine

## 2022-02-01 DIAGNOSIS — Z1231 Encounter for screening mammogram for malignant neoplasm of breast: Secondary | ICD-10-CM

## 2022-02-15 ENCOUNTER — Ambulatory Visit
Admission: EM | Admit: 2022-02-15 | Discharge: 2022-02-15 | Disposition: A | Discharge status: Home / Self Care | Payer: Medicare Other | Attending: Family Medicine | Admitting: Family Medicine

## 2022-02-15 DIAGNOSIS — Z1152 Encounter for screening for COVID-19: Secondary | ICD-10-CM | POA: Diagnosis present

## 2022-02-15 DIAGNOSIS — R110 Nausea: Secondary | ICD-10-CM | POA: Diagnosis present

## 2022-02-15 DIAGNOSIS — J069 Acute upper respiratory infection, unspecified: Secondary | ICD-10-CM | POA: Diagnosis present

## 2022-02-15 LAB — RESP PANEL BY RT-PCR (FLU A&B, COVID) ARPGX2
Influenza A by PCR: NEGATIVE
Influenza B by PCR: NEGATIVE
SARS Coronavirus 2 by RT PCR: NEGATIVE

## 2022-02-15 MED ORDER — ONDANSETRON HCL 4 MG PO TABS: 4.0000 mg | ORAL_TABLET | Freq: Four times a day (QID) | ORAL | 0 refills | Status: DC

## 2022-02-15 NOTE — Discharge Instructions (Signed)
You were seen today for upper respiratory symptoms and nausea.  You have been swabbed for flu and covid today, and these will be resulted by later today.  We will call you with results and will treat you if needed.  I have sent out a medication to help with the nausea.  I recommend you self-isolate, and get plenty of rest and fluids.

## 2022-02-15 NOTE — ED Triage Notes (Signed)
Patient presents to UC for cough, nasal congestion, and nausea since Saturday. Treating with tylenol and salt water gargles.  Denies fever.

## 2022-02-15 NOTE — ED Provider Notes (Signed)
[facility]    CSN: [id number] Arrival date & time: 02/15/22  1093      History   Chief Complaint Chief Complaint  Patient presents with   Cough   Nasal Congestion   Nausea    HPI Olivia Alvarado is a 71 y.o. female.   Patient is here for URI symptoms since the weekend.  Also very nauseated, unable to eat.  She is having runny nose, congestion, drainage.  Also with cough.  Raw throat.  No ear pain.  + pressure at her eyes.  + lost of taste/smell.  She has not been around anyone with covid.  No fevers/chills.  No OTC meds.        Past Medical History:  Diagnosis Date   Atrophic vaginitis    History of kidney stones    IBS (irritable bowel syndrome)    Lower back pain    Nocturia    OAB (overactive bladder)    Pre-diabetes    Skin cancer    BCC   Squamous carcinoma    Suprapubic pressure    Urethral stricture    Urinary frequency    Urinary incontinence, nocturnal enuresis    Vaginal atrophy    Vulvar lesion     Patient Active Problem List   Diagnosis Date Noted   Trochanteric bursitis of left hip 02/10/2021   Tear of left gluteus minimus tendon 02/10/2021   Hyponatremia 02/09/2021   Greater trochanteric bursitis of left hip 02/09/2021   Compression fracture of lumbar vertebra, closed, initial encounter ([place]) 08/22/2020   Irregular heart beat 08/22/2020   Peripheral edema 08/22/2020   Urinary tract infection symptoms 01/03/2019   Postprandial abdominal bloating 10/03/2016   Microscopic hematuria 08/02/2015   Pelvic pain in female 08/02/2015   Mild episode of recurrent major depressive disorder ([place]) 03/12/2015   Diabetes mellitus type 2, uncomplicated ([place]) 23/55/7322   Hypertension 12/04/2013   IBS (irritable bowel syndrome) 12/04/2013    Past Surgical History:  Procedure Laterality Date   COLONOSCOPY     KYPHOPLASTY N/A 09/15/2020   Procedure: T6, T12 Kyphoplasty;  Surgeon: Hessie Knows, MD;  Location: [facility] ORS;  Service:  Orthopedics;  Laterality: N/A;   SKIN CANCER EXCISION      OB History   No obstetric history on file.      Home Medications    Prior to Admission medications   Medication Sig Start Date End Date Taking? Authorizing Provider  atorvastatin (LIPITOR) 10 MG tablet Take 1 tablet by mouth daily. 07/26/20   [provider]  Cholecalciferol 25 MCG (1000 UT) tablet Take 1,000 Units by mouth daily.    [provider]  FLUoxetine (PROZAC) 10 MG tablet Take 10 mg by mouth daily. 05/18/21   [provider]  loratadine (CLARITIN) 10 MG tablet Take by mouth.    [provider]    Family History Family History  Problem Relation Age of Onset   Breast cancer Maternal Grandmother 54   Colon cancer Maternal Grandmother 29   Colon cancer Paternal Grandmother 56   Kidney cancer Mother 9   Rectal cancer Mother 4   Ovarian cancer Mother 73   Colon cancer Maternal Uncle 77   Colon cancer Other 53       Paternal Great Aunt   Colon cancer Other 56       Paternal Great Aunt   Arthritis Other        mother, Father   Diabetes Other  Hypertension Other    Heart disease Other        Mother, Father   Osteoporosis Other     Social History Social History   Tobacco Use   Smoking status: Some Days    Packs/day: 0.50    Years: 40.00    Total pack years: 20.00    Types: Cigarettes, E-cigarettes   Smokeless tobacco: Never  Vaping Use   Vaping Use: Every day   Substances: Nicotine  Substance Use Topics   Alcohol use: No   Drug use: No     Allergies   Rosuvastatin   Review of Systems Review of Systems  Constitutional:  Positive for chills. Negative for fever.  HENT:  Positive for congestion and rhinorrhea.   Respiratory:  Positive for cough.   Cardiovascular: Negative.   Gastrointestinal:  Positive for nausea.  Musculoskeletal: Negative.   Skin: Negative.   Psychiatric/Behavioral: Negative.       Physical Exam Triage Vital Signs ED Triage  Vitals  Enc Vitals Group     BP 02/15/22 1008 112/74     Pulse Rate 02/15/22 1008 87     Resp 02/15/22 1008 16     Temp 02/15/22 1008 97.8 F (36.6 C)     Temp Source 02/15/22 1008 Oral     SpO2 02/15/22 1008 97 %     Weight --      Height --      Head Circumference --      Peak Flow --      Pain Score 02/15/22 1006 0     Pain Loc --      Pain Edu? --      Excl. in [place]? --    No data found.  Updated Vital Signs BP 112/74 (BP Location: Left Arm)   Pulse 87   Temp 97.8 F (36.6 C) (Oral)   Resp 16   SpO2 97%   Visual Acuity Right Eye Distance:   Left Eye Distance:   Bilateral Distance:    Right Eye Near:   Left Eye Near:    Bilateral Near:     Physical Exam Constitutional:      Appearance: Normal appearance.  HENT:     Head: Normocephalic.     Mouth/Throat:     Mouth: Mucous membranes are moist.  Cardiovascular:     Rate and Rhythm: Normal rate and regular rhythm.  Pulmonary:     Effort: Pulmonary effort is normal.     Breath sounds: Normal breath sounds.  Musculoskeletal:        General: Normal range of motion.     Cervical back: Normal range of motion and neck supple. No tenderness.  Lymphadenopathy:     Cervical: No cervical adenopathy.  Neurological:     General: No focal deficit present.     Mental Status: She is alert.  Psychiatric:        Mood and Affect: Mood normal.      UC Treatments / Results  Labs (all labs ordered are listed, but only abnormal results are displayed) Labs Reviewed  RESP PANEL BY RT-PCR (FLU A&B, COVID) [id number]    EKG   Radiology No results found.  Procedures Procedures (including critical care time)  Medications Ordered in UC Medications - No data to display  Initial Impression / Assessment and Plan / UC Course  I have reviewed the triage vital signs and the nursing notes.  Pertinent labs & imaging results that were available during my care of  the patient were reviewed by me and considered in my medical  decision making (see chart for details).  Patient seen today for uri symptoms and nausea.  She was swabbed for flu and covid.  Day #4 of symptoms.  If positive, she could be treated with molnupiravir.   Final Clinical Impressions(s) / UC Diagnoses   Final diagnoses:  Upper respiratory tract infection, unspecified type  Encounter for screening for COVID-19  Nausea     Discharge Instructions      You were seen today for upper respiratory symptoms and nausea.  You have been swabbed for flu and covid today, and these will be resulted by later today.  We will call you with results and will treat you if needed.  I have sent out a medication to help with the nausea.  I recommend you self-isolate, and get plenty of rest and fluids.     ED Prescriptions     Medication Sig Dispense Auth. Provider   ondansetron (ZOFRAN) 4 MG tablet Take 1 tablet (4 mg total) by mouth every 6 (six) hours. 12 tablet Rondel Oh, MD      PDMP not reviewed this encounter.   Rondel Oh, MD 02/15/22 [phone]

## 2022-02-23 ENCOUNTER — Ambulatory Visit
Admission: RE | Admit: 2022-02-23 | Discharge: 2022-02-23 | Disposition: A | Discharge status: Home / Self Care | Payer: Medicare Other | Source: Ambulatory Visit | Attending: Family Medicine | Admitting: Family Medicine

## 2022-02-23 DIAGNOSIS — Z1231 Encounter for screening mammogram for malignant neoplasm of breast: Secondary | ICD-10-CM | POA: Insufficient documentation

## 2022-06-01 ENCOUNTER — Emergency Department: Payer: Medicare Other

## 2022-06-01 ENCOUNTER — Other Ambulatory Visit: Payer: Self-pay

## 2022-06-01 ENCOUNTER — Emergency Department
Admission: EM | Admit: 2022-06-01 | Discharge: 2022-06-01 | Disposition: A | Discharge status: Home / Self Care | Payer: Medicare Other | Attending: Emergency Medicine | Admitting: Emergency Medicine

## 2022-06-01 DIAGNOSIS — K5792 Diverticulitis of intestine, part unspecified, without perforation or abscess without bleeding: Secondary | ICD-10-CM

## 2022-06-01 DIAGNOSIS — K922 Gastrointestinal hemorrhage, unspecified: Secondary | ICD-10-CM

## 2022-06-01 DIAGNOSIS — K5733 Diverticulitis of large intestine without perforation or abscess with bleeding: Secondary | ICD-10-CM | POA: Diagnosis not present

## 2022-06-01 DIAGNOSIS — R195 Other fecal abnormalities: Secondary | ICD-10-CM | POA: Diagnosis present

## 2022-06-01 LAB — CBC WITH DIFFERENTIAL/PLATELET
Abs Immature Granulocytes: 0.02 10*3/uL (ref 0.00–0.07)
Abs Immature Granulocytes: 0.04 10*3/uL (ref 0.00–0.07)
Abs Immature Granulocytes: 0.02 10*3/uL (ref 0.00–0.07)
Basophils Absolute: 0 10*3/uL (ref 0.0–0.1)
Basophils Absolute: 0 10*3/uL (ref 0.0–0.1)
Basophils Absolute: 0 10*3/uL (ref 0.0–0.1)
Basophils Relative: 0 %
Basophils Relative: 0 %
Basophils Relative: 0 %
Eosinophils Absolute: 0.2 10*3/uL (ref 0.0–0.5)
Eosinophils Absolute: 0.1 10*3/uL (ref 0.0–0.5)
Eosinophils Absolute: 0.1 10*3/uL (ref 0.0–0.5)
Eosinophils Relative: 1 %
Eosinophils Relative: 1 %
Eosinophils Relative: 1 %
HCT: 41.2 % (ref 36.0–46.0)
HCT: 35.2 % — ABNORMAL LOW (ref 36.0–46.0)
HCT: 40.2 % (ref 36.0–46.0)
Hemoglobin: 13.1 g/dL (ref 12.0–15.0)
Hemoglobin: 11.1 g/dL — ABNORMAL LOW (ref 12.0–15.0)
Hemoglobin: 12.8 g/dL (ref 12.0–15.0)
Immature Granulocytes: 0 %
Immature Granulocytes: 0 %
Immature Granulocytes: 0 %
Lymphocytes Relative: 15 %
Lymphocytes Relative: 15 %
Lymphocytes Relative: 16 %
Lymphs Abs: 1.7 10*3/uL (ref 0.7–4.0)
Lymphs Abs: 1.4 10*3/uL (ref 0.7–4.0)
Lymphs Abs: 1.6 10*3/uL (ref 0.7–4.0)
MCH: 28.1 pg (ref 26.0–34.0)
MCH: 28.2 pg (ref 26.0–34.0)
MCH: 28.2 pg (ref 26.0–34.0)
MCHC: 31.8 g/dL (ref 30.0–36.0)
MCHC: 31.5 g/dL (ref 30.0–36.0)
MCHC: 31.8 g/dL (ref 30.0–36.0)
MCV: 88.4 fL (ref 80.0–100.0)
MCV: 89.6 fL (ref 80.0–100.0)
MCV: 88.5 fL (ref 80.0–100.0)
Monocytes Absolute: 1 10*3/uL (ref 0.1–1.0)
Monocytes Absolute: 0.7 10*3/uL (ref 0.1–1.0)
Monocytes Absolute: 0.6 10*3/uL (ref 0.1–1.0)
Monocytes Relative: 9 %
Monocytes Relative: 7 %
Monocytes Relative: 6 %
Neutro Abs: 8.2 10*3/uL — ABNORMAL HIGH (ref 1.7–7.7)
Neutro Abs: 7.4 10*3/uL (ref 1.7–7.7)
Neutro Abs: 7.4 10*3/uL (ref 1.7–7.7)
Neutrophils Relative %: 75 %
Neutrophils Relative %: 77 %
Neutrophils Relative %: 77 %
Platelets: 410 10*3/uL — ABNORMAL HIGH (ref 150–400)
Platelets: 327 10*3/uL (ref 150–400)
Platelets: 388 10*3/uL (ref 150–400)
RBC: 4.66 MIL/uL (ref 3.87–5.11)
RBC: 3.93 MIL/uL (ref 3.87–5.11)
RBC: 4.54 MIL/uL (ref 3.87–5.11)
RDW: 13.1 % (ref 11.5–15.5)
RDW: 13.1 % (ref 11.5–15.5)
RDW: 13.2 % (ref 11.5–15.5)
WBC: 11.1 10*3/uL — ABNORMAL HIGH (ref 4.0–10.5)
WBC: 9.6 10*3/uL (ref 4.0–10.5)
WBC: 9.9 10*3/uL (ref 4.0–10.5)
nRBC: 0 % (ref 0.0–0.2)
nRBC: 0 % (ref 0.0–0.2)
nRBC: 0 % (ref 0.0–0.2)

## 2022-06-01 LAB — BASIC METABOLIC PANEL
Anion gap: 8 (ref 5–15)
BUN: 20 mg/dL (ref 8–23)
CO2: 25 mmol/L (ref 22–32)
Calcium: 9 mg/dL (ref 8.9–10.3)
Chloride: 105 mmol/L (ref 98–111)
Creatinine, Ser: 0.54 mg/dL (ref 0.44–1.00)
GFR, Estimated: >60 mL/min (ref >60)
Glucose, Bld: 107 mg/dL — ABNORMAL HIGH (ref 70–99)
Potassium: 3.8 mmol/L (ref 3.5–5.1)
Sodium: 138 mmol/L (ref 135–145)

## 2022-06-01 LAB — TYPE AND SCREEN
ABO/RH(D): O POS
Antibody Screen: NEGATIVE

## 2022-06-01 LAB — PROTIME-INR
INR: 1.1 (ref 0.8–1.2)
Prothrombin Time: 13.8 seconds (ref 11.4–15.2)

## 2022-06-01 MED ORDER — PIPERACILLIN-TAZOBACTAM 3.375 G IVPB 30 MIN
3.3750 g | Freq: Once | INTRAVENOUS | Status: AC
  Administered 2022-06-01: 3.375 g via INTRAVENOUS
  Filled 2022-06-01: qty 50

## 2022-06-01 MED ORDER — IOHEXOL 300 MG/ML  SOLN
100.0000 mL | Freq: Once | INTRAMUSCULAR | Status: AC | PRN
  Administered 2022-06-01: 100 mL via INTRAVENOUS

## 2022-06-01 MED ORDER — AMOXICILLIN-POT CLAVULANATE 875-125 MG PO TABS: 1.0000 | ORAL_TABLET | Freq: Two times a day (BID) | ORAL | 0 refills | Status: AC

## 2022-06-01 NOTE — ED Provider Notes (Signed)
-----------------------------------------   3:48 PM on 06/01/2022 -----------------------------------------  Blood pressure (!) 148/79, pulse 78, temperature 98.1 F (36.7 C), resp. rate 16, height 5' 2"$  (1.575 m), weight 63 kg, SpO2 93 %.  Assuming care from Dr. Cherylann Banas.  In short, [name] is a 72 y.o. female with a chief complaint of Rectal Bleeding .  Refer to the original H&P for additional details.  The current plan of care is to follow-up repeat CBC in 4 hours given concern for GI bleeding.  ----------------------------------------- 7:31 PM on 06/01/2022 ----------------------------------------- Repeat CBC is reassuring with stable hemoglobin, patient with no further bleeding here in the ED.  She is very motivated for discharge home, has follow-up scheduled with her GI provider in 5 days.  She received initial dose of antibiotics here in the ED, is appropriate for outpatient management with Augmentin and declines Zofran.  She was counseled to return to the ED for new or worsening symptoms, patient agrees with plan.    Blake Divine, MD 06/01/22 [phone]

## 2022-06-01 NOTE — ED Provider Notes (Signed)
[facility] Provider Note    Event Date/Time   First MD Initiated Contact with Patient 06/01/22 1037     (approximate)   History   Rectal Bleeding   HPI  Olivia Alvarado is a 72 y.o. female with a history of IBS, hypertension, and diabetes who presents with blood in the stool, initially a small amount of spotting with her stool when she had a bowel movement yesterday, and today she has had several looser bowel movements with a larger amount of red blood.  The patient states that she has relatively frequent bowel movements that are formed.  She does not suffer from constipation or diarrhea.  She has had some mild nausea but denies vomiting.  She does report associated left lower quadrant abdominal pain.  She has no fever or chills or any urinary symptoms.  I viewed the past medical records.  The patient was most recently seen in urgent care on 11/14 of last year for URI.  Her most recent hospitalization was in 2022 due to hyponatremia and hip bursitis.  She has no prior GI bleed history.   Physical Exam   Triage Vital Signs: ED Triage Vitals  Enc Vitals Group     BP 06/01/22 1034 (!) 161/100     Pulse Rate 06/01/22 1034 91     Resp 06/01/22 1034 18     Temp 06/01/22 1034 98.1 F (36.7 C)     Temp Source 06/01/22 1034 Oral     SpO2 06/01/22 1034 94 %     Weight 06/01/22 1035 138 lb 14.2 oz (63 kg)     Height 06/01/22 1035 '5\' 2"'$  (1.575 m)     Head Circumference --      Peak Flow --      Pain Score 06/01/22 1035 7     Pain Loc --      Pain Edu? --      Excl. in [place]? --     Most recent vital signs: Vitals:   06/01/22 1335 06/01/22 1340  BP:  (!) 148/79  Pulse: 74 78  Resp: 17 16  Temp: 98.1 F (36.7 C)   SpO2: 94% 93%     General: Awake, no distress.  CV:  Good peripheral perfusion.  Resp:  Normal effort.  Abd:  Soft with mild left lower quadrant tenderness.  No distention.  Other:  Normal external rectal exam with no hemorrhoids or  other lesions.  Brown stool with some red blood residue on DRE; no melena.   ED Results / Procedures / Treatments   Labs (all labs ordered are listed, but only abnormal results are displayed) Labs Reviewed  BASIC METABOLIC PANEL - Abnormal; Notable for the following components:      Result Value   Glucose, Bld 107 (*)    All other components within normal limits  CBC WITH DIFFERENTIAL/PLATELET - Abnormal; Notable for the following components:   WBC 11.1 (*)    Platelets 410 (*)    Neutro Abs 8.2 (*)    All other components within normal limits  CBC WITH DIFFERENTIAL/PLATELET - Abnormal; Notable for the following components:   Hemoglobin 11.1 (*)    HCT 35.2 (*)    All other components within normal limits  PROTIME-INR  CBC WITH DIFFERENTIAL/PLATELET  TYPE AND SCREEN     EKG  ED ECG REPORT I, Arta Silence, the attending physician, personally viewed and interpreted this ECG.  Date: 06/01/2022 EKG Time: 1039 Rate: 86 Rhythm:  normal sinus rhythm QRS Axis: normal Intervals: normal ST/T Wave abnormalities: normal Narrative Interpretation: no evidence of acute ischemia    RADIOLOGY  CT abdomen/pelvis: I independently viewed and interpreted the images; there are no dilated bowel loops or any free air or free fluid.  Radiology report indicates mild diverticulitis with no evidence of abscess or other acute complication.  PROCEDURES:  Critical Care performed: No  Procedures   MEDICATIONS ORDERED IN ED: Medications  iohexol (OMNIPAQUE) 300 MG/ML solution 100 mL (100 mLs Intravenous Contrast Given 06/01/22 1206)     IMPRESSION / MDM / ASSESSMENT AND PLAN / ED COURSE  I reviewed the triage vital signs and the nursing notes.  72 year old female with PMH as noted above presents with some blood in her stool since yesterday associated with left lower quadrant abdominal pain and nausea.  DRE reveals brown stool with blood rather than melena.  There is mild tenderness  on exam.  The vital signs are normal and the patient is well-appearing.    Differential diagnosis includes, but is not limited to, diverticulosis, acute diverticulitis, colitis, gastroenteritis, internal hemorrhoid.  Overall presentation is consistent with lower GI bleed.  There is no evidence of PUD, gastritis, or other upper GI source.  We will obtain lab workup, CT abdomen/pelvis, and reassess.  Patient's presentation is most consistent with acute presentation with potential threat to life or bodily function.  ----------------------------------------- 3:43 PM on 06/01/2022 -----------------------------------------  CT shows evidence of acute diverticulitis with no complications.  The patient has remained stable with no further bowel movements.  However her hemoglobin has decreased from 13.1 to 11.1 over 4 hours.  I consulted Dr. Vicente Males from [facility] and discussed the case with him.  He recommends observing the patient for an additional 4 hours and then repeating the CBC again.  If there is no significant change at that time, the patient may be able to go home with outpatient follow-up.  The patient already has a GI appointment scheduled for Monday, 5 days from now.  If there is further drop in the hemoglobin and he recommends admission.    The patient agrees with this plan.  I have signed her out to the oncoming ED physician Dr. Charna Archer.   FINAL CLINICAL IMPRESSION(S) / ED DIAGNOSES   Final diagnoses:  Diverticulitis  Lower GI bleed     Rx / DC Orders   ED Discharge Orders     None        Note:  This document was prepared using Dragon voice recognition software and may include unintentional dictation errors.    Arta Silence, MD 06/01/22 [phone]

## 2022-06-01 NOTE — ED Triage Notes (Signed)
Pt here with rectal bleeding that started yesterday. Pt states the bleeding has increased, bright red in color with clots. Pt c/o lower abd pain as well. Pt denies V/D but endorse nausea. Pt has hx of IBS.

## 2022-06-27 ENCOUNTER — Emergency Department
Admission: EM | Admit: 2022-06-27 | Discharge: 2022-06-27 | Disposition: A | Discharge status: Home / Self Care | Payer: Medicare Other | Attending: Emergency Medicine | Admitting: Emergency Medicine

## 2022-06-27 ENCOUNTER — Emergency Department: Payer: Medicare Other

## 2022-06-27 ENCOUNTER — Other Ambulatory Visit: Payer: Self-pay

## 2022-06-27 DIAGNOSIS — K5732 Diverticulitis of large intestine without perforation or abscess without bleeding: Secondary | ICD-10-CM | POA: Insufficient documentation

## 2022-06-27 DIAGNOSIS — R1032 Left lower quadrant pain: Secondary | ICD-10-CM | POA: Diagnosis present

## 2022-06-27 DIAGNOSIS — K5792 Diverticulitis of intestine, part unspecified, without perforation or abscess without bleeding: Secondary | ICD-10-CM

## 2022-06-27 DIAGNOSIS — I10 Essential (primary) hypertension: Secondary | ICD-10-CM | POA: Insufficient documentation

## 2022-06-27 LAB — COMPREHENSIVE METABOLIC PANEL
ALT: 16 U/L (ref 0–44)
AST: 20 U/L (ref 15–41)
Albumin: 4.2 g/dL (ref 3.5–5.0)
Alkaline Phosphatase: 62 U/L (ref 38–126)
Anion gap: 9 (ref 5–15)
BUN: 15 mg/dL (ref 8–23)
CO2: 25 mmol/L (ref 22–32)
Calcium: 9.2 mg/dL (ref 8.9–10.3)
Chloride: 106 mmol/L (ref 98–111)
Creatinine, Ser: 0.61 mg/dL (ref 0.44–1.00)
GFR, Estimated: >60 mL/min (ref >60)
Glucose, Bld: 162 mg/dL — ABNORMAL HIGH (ref 70–99)
Potassium: 3.9 mmol/L (ref 3.5–5.1)
Sodium: 140 mmol/L (ref 135–145)
Total Bilirubin: 0.7 mg/dL (ref 0.3–1.2)
Total Protein: 7.6 g/dL (ref 6.5–8.1)

## 2022-06-27 LAB — LIPASE, BLOOD: Lipase: 34 U/L (ref 11–51)

## 2022-06-27 LAB — CBC
HCT: 41.8 % (ref 36.0–46.0)
Hemoglobin: 13.4 g/dL (ref 12.0–15.0)
MCH: 28.3 pg (ref 26.0–34.0)
MCHC: 32.1 g/dL (ref 30.0–36.0)
MCV: 88.2 fL (ref 80.0–100.0)
Platelets: 379 10*3/uL (ref 150–400)
RBC: 4.74 MIL/uL (ref 3.87–5.11)
RDW: 13 % (ref 11.5–15.5)
WBC: 7.2 10*3/uL (ref 4.0–10.5)
nRBC: 0 % (ref 0.0–0.2)

## 2022-06-27 LAB — URINALYSIS, ROUTINE W REFLEX MICROSCOPIC
Bacteria, UA: NONE SEEN
Bilirubin Urine: NEGATIVE
Glucose, UA: NEGATIVE mg/dL
Ketones, ur: NEGATIVE mg/dL
Nitrite: NEGATIVE
Protein, ur: NEGATIVE mg/dL
Specific Gravity, Urine: 1.013 (ref 1.005–1.030)
pH: 5 (ref 5.0–8.0)

## 2022-06-27 MED ORDER — CIPROFLOXACIN HCL 500 MG PO TABS: 500.0000 mg | ORAL_TABLET | Freq: Two times a day (BID) | ORAL | 0 refills | Status: AC

## 2022-06-27 MED ORDER — METRONIDAZOLE 500 MG PO TABS: 500.0000 mg | ORAL_TABLET | Freq: Three times a day (TID) | ORAL | 0 refills | Status: AC

## 2022-06-27 MED ORDER — IOHEXOL 300 MG/ML  SOLN
80.0000 mL | Freq: Once | INTRAMUSCULAR | Status: AC | PRN
  Administered 2022-06-27: 80 mL via INTRAVENOUS

## 2022-06-27 NOTE — ED Triage Notes (Signed)
Pt sts that she has been having LLQ abd pain. Pt sts that she has been having the pain for the last several weeks. Pt sts that she was previously dx with diverticulitis. Pt sts that she has been having N/D.

## 2022-06-27 NOTE — ED Provider Notes (Signed)
[facility] Provider Note    Event Date/Time   First MD Initiated Contact with Patient 06/27/22 1128     (approximate)   History   Abdominal Pain   HPI  Olivia Alvarado is a 72 y.o. female  HTN, IBS who comes in with abdominal pain. She reports being here in feb with diverticulitis. She says since constant dull crampy pain in the LLQ pain. Nothing makes it better or worse. Does have some bloating, nausea. No blood in stool. NO dizziness/no urinary symptoms.    Physical Exam   Triage Vital Signs: ED Triage Vitals  Enc Vitals Group     BP 06/27/22 1122 (!) 168/90     Pulse Rate 06/27/22 1122 88     Resp 06/27/22 1122 17     Temp 06/27/22 1122 98.7 F (37.1 C)     Temp Source 06/27/22 1122 Oral     SpO2 06/27/22 1122 94 %     Weight 06/27/22 1120 138 lb (62.6 kg)     Height --      Head Circumference --      Peak Flow --      Pain Score 06/27/22 1120 4     Pain Loc --      Pain Edu? --      Excl. in [place]? --     Most recent vital signs: Vitals:   06/27/22 1122  BP: (!) 168/90  Pulse: 88  Resp: 17  Temp: 98.7 F (37.1 C)  SpO2: 94%     General: Awake, no distress.  CV:  Good peripheral perfusion.  Resp:  Normal effort.  Abd:  No distention. Tender in the llq  Other:     ED Results / Procedures / Treatments   Labs (all labs ordered are listed, but only abnormal results are displayed) Labs Reviewed  CBC  LIPASE, BLOOD  COMPREHENSIVE METABOLIC PANEL  URINALYSIS, ROUTINE W REFLEX MICROSCOPIC     RADIOLOGY I have reviewed the CT personally and interpretted  no kidney stone   PROCEDURES:  Critical Care performed: No  Procedures   MEDICATIONS ORDERED IN ED: Medications  iohexol (OMNIPAQUE) 300 MG/ML solution 80 mL (80 mLs Intravenous Contrast Given 06/27/22 1224)     IMPRESSION / MDM / [place] / ED COURSE  I reviewed the triage vital signs and the nursing notes.   Patient's presentation is most  consistent with acute presentation with potential threat to life or bodily function.   Differential: abscess, diverticulitis, mass-- will get CT again patient denies wanting anything for pain at this time.  CBC normal CMP reassuring.  Lipase normal.  Urine with 6-10 white cells will send for culture  IMPRESSION: Previously seen inflammatory change in the sigmoid colon consistent with diverticulitis on the study from 05/24/2022 has nearly resolved, with minimal residual. No new inflammatory change or abscess formation.   Reevaluated patient updated on improving CT imaging may be a residual diverticulitis still there.  We discussed the pros and cons of repeat antibiotics including risk factors including diarrhea, confusion, ligament and she is and she would like to proceed with repeat antibiotics.  She understands that this may not help and that this may be related to her irritable bowel syndrome but she reports want to try anything to try to help.  I explained the importance of following up with GI doctor for colonoscopy and further workup of this discomfort that she has.  We also discussed bowel rest and  liquid diet while on the antibiotics and she expressed understanding.  She felt comfortable with discharge home.  Given she was previously on Augmentin we will trial some Cipro, Flagyl.  The patient is on the cardiac monitor to evaluate for evidence of arrhythmia and/or significant heart rate changes.      FINAL CLINICAL IMPRESSION(S) / ED DIAGNOSES   Final diagnoses:  Diverticulitis     Rx / DC Orders   ED Discharge Orders          Ordered    ciprofloxacin (CIPRO) 500 MG tablet  2 times daily        06/27/22 1443    metroNIDAZOLE (FLAGYL) 500 MG tablet  3 times daily        06/27/22 1443             Note:  This document was prepared using Dragon voice recognition software and may include unintentional dictation errors.   Vanessa Posey, MD 06/27/22 [phone]

## 2022-06-27 NOTE — Discharge Instructions (Addendum)
Follow-up with your primary care doctor test a new set of antibiotics.  I do not drink alcohol with these.  Return to the ER if develop worsening symptoms, fevers or any other concerns.  Otherwise follow-up with GI to discuss your continued lower abdominal pain- trial liquid diet and low fiber foods.   IMPRESSION: Previously seen inflammatory change in the sigmoid colon consistent with diverticulitis on the study from 05/24/2022 has nearly resolved, with minimal residual. No new inflammatory change or abscess formation.

## 2022-06-28 LAB — URINE CULTURE: Culture: NO GROWTH

## 2022-09-02 ENCOUNTER — Encounter: Payer: Self-pay | Admitting: *Deleted

## 2022-09-08 ENCOUNTER — Encounter: Payer: Self-pay | Admitting: *Deleted

## 2022-09-09 ENCOUNTER — Encounter: Payer: Self-pay | Admitting: *Deleted

## 2022-09-09 ENCOUNTER — Ambulatory Visit
Admission: RE | Admit: 2022-09-09 | Discharge: 2022-09-09 | Disposition: A | Discharge status: Home / Self Care | Payer: Medicare Other | Attending: Gastroenterology | Admitting: Gastroenterology

## 2022-09-09 ENCOUNTER — Other Ambulatory Visit: Payer: Self-pay

## 2022-09-09 ENCOUNTER — Encounter
Admission: RE | Disposition: A | Discharge status: Home / Self Care | Payer: Self-pay | Source: Home / Self Care | Attending: Gastroenterology

## 2022-09-09 ENCOUNTER — Ambulatory Visit: Payer: Medicare Other | Admitting: Anesthesiology

## 2022-09-09 DIAGNOSIS — F17290 Nicotine dependence, other tobacco product, uncomplicated: Secondary | ICD-10-CM | POA: Insufficient documentation

## 2022-09-09 DIAGNOSIS — Z1211 Encounter for screening for malignant neoplasm of colon: Secondary | ICD-10-CM | POA: Diagnosis not present

## 2022-09-09 DIAGNOSIS — E119 Type 2 diabetes mellitus without complications: Secondary | ICD-10-CM | POA: Insufficient documentation

## 2022-09-09 DIAGNOSIS — J449 Chronic obstructive pulmonary disease, unspecified: Secondary | ICD-10-CM | POA: Insufficient documentation

## 2022-09-09 DIAGNOSIS — I10 Essential (primary) hypertension: Secondary | ICD-10-CM | POA: Insufficient documentation

## 2022-09-09 DIAGNOSIS — K5730 Diverticulosis of large intestine without perforation or abscess without bleeding: Secondary | ICD-10-CM | POA: Diagnosis not present

## 2022-09-09 DIAGNOSIS — K640 First degree hemorrhoids: Secondary | ICD-10-CM | POA: Diagnosis not present

## 2022-09-09 DIAGNOSIS — F32A Depression, unspecified: Secondary | ICD-10-CM | POA: Insufficient documentation

## 2022-09-09 DIAGNOSIS — D120 Benign neoplasm of cecum: Secondary | ICD-10-CM | POA: Insufficient documentation

## 2022-09-09 DIAGNOSIS — D124 Benign neoplasm of descending colon: Secondary | ICD-10-CM | POA: Insufficient documentation

## 2022-09-09 DIAGNOSIS — Z800 Family history of malignant neoplasm of digestive organs: Secondary | ICD-10-CM | POA: Diagnosis present

## 2022-09-09 HISTORY — PX: COLONOSCOPY WITH PROPOFOL: SHX5780

## 2022-09-09 SURGERY — COLONOSCOPY WITH PROPOFOL
Anesthesia: General

## 2022-09-09 MED ORDER — SODIUM CHLORIDE 0.9 % IV SOLN: INTRAVENOUS | Status: DC

## 2022-09-09 MED ORDER — PROPOFOL 10 MG/ML IV BOLUS
INTRAVENOUS | Status: DC | PRN
  Administered 2022-09-09: 70 mg via INTRAVENOUS
  Administered 2022-09-09 (×2): 50 mg via INTRAVENOUS

## 2022-09-09 MED ORDER — LIDOCAINE HCL (CARDIAC) PF 100 MG/5ML IV SOSY
PREFILLED_SYRINGE | INTRAVENOUS | Status: DC | PRN
  Administered 2022-09-09: 60 mg via INTRAVENOUS

## 2022-09-09 NOTE — H&P (Signed)
Outpatient short stay form Pre-procedure 09/09/2022  Regis Bill, MD  Primary Physician: Marina Goodell, MD  Reason for visit: Screening  History of present illness:    72 y/o lady with history of hypertension and HLD here for screening colonoscopy as mother with rectal cancer in her 87's. No blood thinners. No significant abdominal surgeries. Had diverticulitis earlier this year that was not complicated.    Current Facility-Administered Medications:    0.9 %  sodium chloride infusion, , Intravenous, Continuous, Chenise Mulvihill, Rossie Muskrat, MD, Last Rate: 20 mL/hr at 09/09/22 0954, New Bag at 09/09/22 0954  Medications Prior to Admission  Medication Sig Dispense Refill Last Dose   atorvastatin (LIPITOR) 10 MG tablet Take 1 tablet by mouth daily.   09/08/2022   FLUoxetine (PROZAC) 10 MG tablet Take 10 mg by mouth daily.   09/08/2022   valsartan (DIOVAN) 40 MG tablet Take 40 mg by mouth daily.   09/08/2022   Cholecalciferol 25 MCG (1000 UT) tablet Take 1,000 Units by mouth daily.      loratadine (CLARITIN) 10 MG tablet Take by mouth.      ondansetron (ZOFRAN) 4 MG tablet Take 1 tablet (4 mg total) by mouth every 6 (six) hours. 12 tablet 0      Allergies  Allergen Reactions   Rosuvastatin Other (See Comments)    Other reaction(s): Muscle Pain     Past Medical History:  Diagnosis Date   Atrophic vaginitis    History of kidney stones    IBS (irritable bowel syndrome)    Lower back pain    Nocturia    OAB (overactive bladder)    Pre-diabetes    Skin cancer    BCC   Squamous carcinoma    Suprapubic pressure    Urethral stricture    Urinary frequency    Urinary incontinence, nocturnal enuresis    Vaginal atrophy    Vulvar lesion     Review of systems:  Otherwise negative.    Physical Exam  Gen: Alert, oriented. Appears stated age.  HEENT: PERRLA. Lungs: No respiratory distress CV: RRR Abd: soft, benign, no masses Ext: No edema    Planned procedures: Proceed  with colonoscopy. The patient understands the nature of the planned procedure, indications, risks, alternatives and potential complications including but not limited to bleeding, infection, perforation, damage to internal organs and possible oversedation/side effects from anesthesia. The patient agrees and gives consent to proceed.  Please refer to procedure notes for findings, recommendations and patient disposition/instructions.     Regis Bill, MD [facility] Gastroenterology

## 2022-09-09 NOTE — Anesthesia Postprocedure Evaluation (Signed)
Anesthesia Post Note  Patient: Olivia Alvarado  Procedure(s) Performed: COLONOSCOPY WITH PROPOFOL  Patient location during evaluation: PACU Anesthesia Type: General Level of consciousness: awake and alert, oriented and patient cooperative Pain management: pain level controlled Vital Signs Assessment: post-procedure vital signs reviewed and stable Respiratory status: spontaneous breathing, nonlabored ventilation and respiratory function stable Cardiovascular status: blood pressure returned to baseline and stable Postop Assessment: adequate PO intake Anesthetic complications: no   No notable events documented.   Last Vitals:  Vitals:   09/09/22 1058 09/09/22 1108  BP: 119/72 120/62  Pulse: 70 (!) 35  Resp: 16 15  Temp:    SpO2: 96% 97%    Last Pain:  Vitals:   09/09/22 1108  TempSrc:   PainSc: 0-No pain                 Reed Breech

## 2022-09-09 NOTE — Op Note (Signed)
[facility] Gastroenterology Patient Name: Olivia Alvarado Procedure Date: 09/09/2022 10:08 AM MRN: [id number] Account #: [id number] Date of Birth: 1951/01/01 Admit Type: Outpatient Age: 72 Room: [facility] ENDO ROOM 3 Gender: Female Note Status: Supervisor Override Instrument Name: Prentice Docker [id number] Procedure:             Colonoscopy Indications:           Personal history of colonic polyps Providers:             Eather Colas MD, MD Referring MD:          Eather Colas MD, MD (Referring MD), Marina Goodell (Referring MD) Medicines:             Monitored Anesthesia Care Complications:         No immediate complications. Estimated blood loss:                         Minimal. Procedure:             Pre-Anesthesia Assessment:                        - Prior to the procedure, a History and Physical was                         performed, and patient medications and allergies were                         reviewed. The patient is competent. The risks and                         benefits of the procedure and the sedation options and                         risks were discussed with the patient. All questions                         were answered and informed consent was obtained.                         Patient identification and proposed procedure were                         verified by the physician, the nurse, the                         anesthesiologist, the anesthetist and the technician                         in the endoscopy suite. Mental Status Examination:                         alert and oriented. Airway Examination: normal                         oropharyngeal airway and neck mobility. Respiratory  Examination: clear to auscultation. CV Examination:                         normal. Prophylactic Antibiotics: The patient does not                         require prophylactic antibiotics. Prior                          Anticoagulants: The patient has taken no anticoagulant                         or antiplatelet agents. ASA Grade Assessment: II - A                         patient with mild systemic disease. After reviewing                         the risks and benefits, the patient was deemed in                         satisfactory condition to undergo the procedure. The                         anesthesia plan was to use monitored anesthesia care                         (MAC). Immediately prior to administration of                         medications, the patient was re-assessed for adequacy                         to receive sedatives. The heart rate, respiratory                         rate, oxygen saturations, blood pressure, adequacy of                         pulmonary ventilation, and response to care were                         monitored throughout the procedure. The physical                         status of the patient was re-assessed after the                         procedure.                        After obtaining informed consent, the colonoscope was                         passed under direct vision. Throughout the procedure,                         the patient's blood pressure, pulse, and oxygen  saturations were monitored continuously. The                         Colonoscope was introduced through the anus and                         advanced to the the cecum, identified by appendiceal                         orifice and ileocecal valve. The colonoscopy was                         somewhat difficult due to restricted mobility of the                         colon. The patient tolerated the procedure well. The                         quality of the bowel preparation was adequate to                         identify polyps. The ileocecal valve, appendiceal                         orifice, and rectum were photographed. Findings:      The perianal and digital  rectal examinations were normal.      A 1 mm polyp was found in the cecum. The polyp was sessile. The polyp       was removed with a jumbo cold forceps. Resection and retrieval were       complete. Estimated blood loss was minimal.      Two sessile polyps were found in the descending colon. The polyps were 2       to 3 mm in size. These polyps were removed with a cold snare. Resection       and retrieval were complete. Estimated blood loss was minimal.      Scattered small-mouthed diverticula were found in the descending colon,       transverse colon and ascending colon.      Many large-mouthed and small-mouthed diverticula were found in the       sigmoid colon.      Internal hemorrhoids were found during retroflexion. The hemorrhoids       were Grade I (internal hemorrhoids that do not prolapse).      The exam was otherwise without abnormality on direct and retroflexion       views. Impression:            - One 1 mm polyp in the cecum, removed with a jumbo                         cold forceps. Resected and retrieved.                        - Two 2 to 3 mm polyps in the descending colon,                         removed with a cold snare. Resected and retrieved.                        -  Diverticulosis in the descending colon, in the                         transverse colon and in the ascending colon.                        - Diverticulosis in the sigmoid colon.                        - Internal hemorrhoids.                        - The examination was otherwise normal on direct and                         retroflexion views. Recommendation:        - Discharge patient to home.                        - Resume previous diet.                        - Continue present medications.                        - Await pathology results.                        - Repeat colonoscopy for surveillance based on                         pathology results.                        - Return to referring  physician as previously                         scheduled. Procedure Code(s):     --- Professional ---                        [phone], Colonoscopy, flexible; with removal of                         tumor(s), polyp(s), or other lesion(s) by snare                         technique                        45380, 59, Colonoscopy, flexible; with biopsy, single                         or multiple Diagnosis Code(s):     --- Professional ---                        Z80.0, Family history of malignant neoplasm of                         digestive organs                        D12.0, Benign neoplasm of cecum  D12.4, Benign neoplasm of descending colon                        K64.0, First degree hemorrhoids                        K57.30, Diverticulosis of large intestine without                         perforation or abscess without bleeding CPT copyright 2022 American Medical Association. All rights reserved. The codes documented in this report are preliminary and upon coder review may  be revised to meet current compliance requirements. Eather Colas MD, MD 09/09/2022 10:51:55 AM Number of Addenda: 0 Note Initiated On: 09/09/2022 10:08 AM Scope Withdrawal Time: 0 hours 9 minutes 56 seconds  Total Procedure Duration: 0 hours 16 minutes 1 second  Estimated Blood Loss:  Estimated blood loss was minimal.      [facility]

## 2022-09-09 NOTE — Transfer of Care (Signed)
Immediate Anesthesia Transfer of Care Note  Patient: Olivia Alvarado  Procedure(s) Performed: COLONOSCOPY WITH PROPOFOL  Patient Location: Endoscopy Unit  Anesthesia Type:General  Level of Consciousness: awake  Airway & Oxygen Therapy: Patient Spontanous Breathing  Post-op Assessment: Report given to RN and Post -op Vital signs reviewed and stable  Post vital signs: Reviewed and stable  Last Vitals:  Vitals Value Taken Time  BP 103/62 09/09/22 1048  Temp 36 C 09/09/22 1048  Pulse 71 09/09/22 1048  Resp 17 09/09/22 1048  SpO2 97 % 09/09/22 1048    Last Pain:  Vitals:   09/09/22 1048  TempSrc: Temporal  PainSc: Asleep         Complications: No notable events documented.

## 2022-09-09 NOTE — Anesthesia Preprocedure Evaluation (Addendum)
Anesthesia Evaluation  Patient identified by MRN, date of birth, ID band Patient awake    Reviewed: Allergy & Precautions, NPO status , Patient's Chart, lab work & pertinent test results  History of Anesthesia Complications Negative for: history of anesthetic complications  Airway Mallampati: IV   Neck ROM: Full    Dental  (+) Missing   Pulmonary COPD, former smoker (quit 2022; current vaping)   Pulmonary exam normal breath sounds clear to auscultation       Cardiovascular hypertension, Normal cardiovascular exam Rhythm:Regular Rate:Normal  ECG 06/01/22: Sinus rhythm Atrial premature complexes   Neuro/Psych  PSYCHIATRIC DISORDERS  Depression    negative neurological ROS     GI/Hepatic negative GI ROS,,,  Endo/Other  diabetes, Type 2    Renal/GU Renal disease (nephrolithiasis)     Musculoskeletal   Abdominal   Peds  Hematology negative hematology ROS (+)   Anesthesia Other Findings   Reproductive/Obstetrics                             Anesthesia Physical Anesthesia Plan  ASA: 2  Anesthesia Plan: General   Post-op Pain Management:    Induction: Intravenous  PONV Risk Score and Plan: 2 and Propofol infusion, TIVA and Treatment may vary due to age or medical condition  Airway Management Planned: Natural Airway  Additional Equipment:   Intra-op Plan:   Post-operative Plan:   Informed Consent: I have reviewed the patients History and Physical, chart, labs and discussed the procedure including the risks, benefits and alternatives for the proposed anesthesia with the patient or authorized representative who has indicated his/her understanding and acceptance.       Plan Discussed with: CRNA  Anesthesia Plan Comments: (LMA/GETA backup discussed.  Patient consented for risks of anesthesia including but not limited to:  - adverse reactions to medications - damage to eyes, teeth,  lips or other oral mucosa - nerve damage due to positioning  - sore throat or hoarseness - damage to heart, brain, nerves, lungs, other parts of body or loss of life  Informed patient about role of CRNA in peri- and intra-operative care.  Patient voiced understanding.)        Anesthesia Quick Evaluation

## 2022-09-09 NOTE — Interval H&P Note (Signed)
History and Physical Interval Note:  09/09/2022 10:22 AM  Olivia Alvarado  has presented today for surgery, with the diagnosis of hx of adenomatous polyp of colon.  The various methods of treatment have been discussed with the patient and family. After consideration of risks, benefits and other options for treatment, the patient has consented to  Procedure(s): COLONOSCOPY WITH PROPOFOL (N/A) as a surgical intervention.  The patient's history has been reviewed, patient examined, no change in status, stable for surgery.  I have reviewed the patient's chart and labs.  Questions were answered to the patient's satisfaction.     Regis Bill  Ok to proceed with colonoscopy

## 2022-09-12 ENCOUNTER — Encounter: Payer: Self-pay | Admitting: Gastroenterology

## 2023-01-24 ENCOUNTER — Other Ambulatory Visit: Payer: Self-pay | Admitting: Family Medicine

## 2023-01-24 DIAGNOSIS — Z1231 Encounter for screening mammogram for malignant neoplasm of breast: Secondary | ICD-10-CM

## 2023-02-27 ENCOUNTER — Ambulatory Visit
Admission: RE | Admit: 2023-02-27 | Discharge: 2023-02-27 | Disposition: A | Discharge status: Home / Self Care | Payer: Medicare Other | Source: Ambulatory Visit | Attending: Family Medicine | Admitting: Family Medicine

## 2023-02-27 DIAGNOSIS — Z1231 Encounter for screening mammogram for malignant neoplasm of breast: Secondary | ICD-10-CM | POA: Insufficient documentation

## 2023-04-26 ENCOUNTER — Encounter: Payer: Self-pay | Admitting: Physician Assistant

## 2023-04-26 ENCOUNTER — Ambulatory Visit (INDEPENDENT_AMBULATORY_CARE_PROVIDER_SITE_OTHER): Payer: Medicare Other | Admitting: Physician Assistant

## 2023-04-26 VITALS — BP 129/77 | HR 82 | Ht 62.0 in | Wt 138.0 lb

## 2023-04-26 DIAGNOSIS — R350 Frequency of micturition: Secondary | ICD-10-CM | POA: Diagnosis not present

## 2023-04-26 DIAGNOSIS — R3129 Other microscopic hematuria: Secondary | ICD-10-CM | POA: Diagnosis not present

## 2023-04-26 LAB — URINALYSIS, COMPLETE
Bilirubin, UA: NEGATIVE
Glucose, UA: NEGATIVE
Ketones, UA: NEGATIVE
Nitrite, UA: NEGATIVE
Protein,UA: NEGATIVE
Specific Gravity, UA: 1.02 (ref 1.005–1.030)
Urobilinogen, Ur: 1 mg/dL (ref 0.2–1.0)
pH, UA: 5.5 (ref 5.0–7.5)

## 2023-04-26 LAB — MICROSCOPIC EXAMINATION
Epithelial Cells (non renal): >10 /[HPF] — AB (ref 0–10)
WBC, UA: >30 /[HPF] — AB (ref 0–5)

## 2023-04-26 LAB — BLADDER SCAN AMB NON-IMAGING: Scan Result: 80

## 2023-04-26 MED ORDER — NITROFURANTOIN MONOHYD MACRO 100 MG PO CAPS
100.0000 mg | ORAL_CAPSULE | Freq: Two times a day (BID) | ORAL | 0 refills | Status: AC
Start: 2023-04-26 — End: 2023-05-01

## 2023-04-26 NOTE — Progress Notes (Signed)
04/26/2023 1:16 PM   Seymour Bars 1950-10-17 [id number]  CC: Chief Complaint  Patient presents with   Urinary Frequency   HPI: Olivia Alvarado is a 73 y.o. female with PMH OAB, urethral stricture s/p urethral dilation, microscopic hematuria with benign cystoscopy with Dr. Marlou Porch in 2017, and nephrolithiasis who presents today for evaluation of urinary frequency and pressure.   Today she reports increased urinary frequency over baseline with constant pelvic pressure, worse overnight, x 6 weeks.  She denies dysuria.  She states that her PCP has told her that she frequently feels she has a UTI when she does not, and symptoms will typically include pelvic pressure.  In-office UA today positive for 1+ blood and 2+ leukocytes; urine microscopy with >30 WBCs/HPF, 3-10 RBCs/HPF, >10 epithelial cells/hpf, amorphous sediment, and many bacteria. PVR 80mL.  PMH: Past Medical History:  Diagnosis Date   Atrophic vaginitis    History of kidney stones    IBS (irritable bowel syndrome)    Lower back pain    Nocturia    OAB (overactive bladder)    Pre-diabetes    Skin cancer    BCC   Squamous carcinoma    Suprapubic pressure    Urethral stricture    Urinary frequency    Urinary incontinence, nocturnal enuresis    Vaginal atrophy    Vulvar lesion     Surgical History: Past Surgical History:  Procedure Laterality Date   COLONOSCOPY     COLONOSCOPY WITH PROPOFOL N/A 09/09/2022   Procedure: COLONOSCOPY WITH PROPOFOL;  Surgeon: Regis Bill, MD;  Location: [facility];  Service: Endoscopy;  Laterality: N/A;   KYPHOPLASTY N/A 09/15/2020   Procedure: T6, T12 Kyphoplasty;  Surgeon: Kennedy Bucker, MD;  Location: [facility] ORS;  Service: Orthopedics;  Laterality: N/A;   SKIN CANCER EXCISION      Home Medications:  Allergies as of 04/26/2023       Reactions   Rosuvastatin Other (See Comments)   Other reaction(s): Muscle Pain        Medication List        Accurate as of  April 26, 2023  1:16 PM. If you have any questions, ask your nurse or doctor.          atorvastatin 10 MG tablet Commonly known as: LIPITOR Take 1 tablet by mouth daily.   Cholecalciferol 25 MCG (1000 UT) tablet Take 1,000 Units by mouth daily.   FLUoxetine 10 MG tablet Commonly known as: PROZAC Take 10 mg by mouth daily.   loratadine 10 MG tablet Commonly known as: CLARITIN Take by mouth.   ondansetron 4 MG tablet Commonly known as: ZOFRAN Take 1 tablet (4 mg total) by mouth every 6 (six) hours.   valsartan 40 MG tablet Commonly known as: DIOVAN Take 40 mg by mouth daily.        Allergies:  Allergies  Allergen Reactions   Rosuvastatin Other (See Comments)    Other reaction(s): Muscle Pain    Family History: Family History  Problem Relation Age of Onset   Breast cancer Maternal Grandmother 63   Colon cancer Maternal Grandmother 31   Colon cancer Paternal Grandmother 58   Kidney cancer Mother 19   Rectal cancer Mother 56   Ovarian cancer Mother 13   Colon cancer Maternal Uncle 75   Colon cancer Other 88       Paternal Great Aunt   Colon cancer Other 63       Paternal [place] Aunt  Arthritis Other        mother, Father   Diabetes Other    Hypertension Other    Heart disease Other        Mother, Father   Osteoporosis Other     Social History:   reports that she has been smoking cigarettes and e-cigarettes. She has a 20 pack-year smoking history. She has never used smokeless tobacco. She reports that she does not drink alcohol and does not use drugs.  Physical Exam: BP 129/77   Pulse 82   Ht 5\' 2"  (1.575 m)   Wt 138 lb (62.6 kg)   BMI 25.24 kg/m   Constitutional:  Alert and oriented, no acute distress, nontoxic appearing HEENT: [place], AT Cardiovascular: No clubbing, cyanosis, or edema Respiratory: Normal respiratory effort, no increased work of breathing Skin: No rashes, bruises or suspicious lesions Neurologic: Grossly intact, no focal  deficits, moving all 4 extremities Psychiatric: Normal mood and affect  Laboratory Data: Results for orders placed or performed in visit on 04/26/23  Bladder Scan (Post Void Residual) in office   Collection Time: 04/26/23  1:10 PM  Result Value Ref Range   Scan Result 80   Microscopic Examination   Collection Time: 04/26/23  1:40 PM   Urine  Result Value Ref Range   WBC, UA >30 (A) 0 - 5 /hpf   RBC, Urine 3-10 (A) 0 - 2 /hpf   Epithelial Cells (non renal) >10 (A) 0 - 10 /hpf   Crystals Present (A) N/A   Crystal Type Amorphous Sediment N/A   Mucus, UA Present (A) Not Estab.   Bacteria, UA Many (A) None seen/Few  Urinalysis, Complete   Collection Time: 04/26/23  1:40 PM  Result Value Ref Range   Specific Gravity, UA 1.020 1.005 - 1.030   pH, UA 5.5 5.0 - 7.5   Color, UA Yellow Yellow   Appearance Ur Cloudy (A) Clear   Leukocytes,UA 2+ (A) Negative   Protein,UA Negative Negative/Trace   Glucose, UA Negative Negative   Ketones, UA Negative Negative   RBC, UA 1+ (A) Negative   Bilirubin, UA Negative Negative   Urobilinogen, Ur 1.0 0.2 - 1.0 mg/dL   Nitrite, UA Negative Negative   Microscopic Examination See below:    Assessment & Plan:   1. Urinary frequency (Primary) Acute on chronic urinary frequency and pelvic pressure x 6 weeks.  Her UA appears grossly infected, though is contaminated.  Will send for culture and start empiric Macrobid.  She is emptying appropriately. - Urinalysis, Complete - Bladder Scan (Post Void Residual) in office - CULTURE, URINE COMPREHENSIVE - nitrofurantoin, macrocrystal-monohydrate, (MACROBID) 100 MG capsule; Take 1 capsule (100 mg total) by mouth 2 (two) times daily for 5 days.  Dispense: 10 capsule; Refill: 0  2. Microscopic hematuria Chronic microscopic hematuria, last cystoscopy about 8 years ago.  Differential is quite broad and would include acute cystitis as above versus nephrolithiasis versus recurrent urethral stricture versus other  bladder pathology.  I recommended repeating a hematuria workup at this time and she was in agreement. - CT HEMATURIA WORKUP; Future   Return for Cysto and CTU results.  Carman Ching, PA-C  [facility] [place] [address], [address] [place], [place] [address] [phone]

## 2023-04-26 NOTE — Patient Instructions (Signed)

## 2023-04-29 LAB — CULTURE, URINE COMPREHENSIVE

## 2023-05-08 ENCOUNTER — Ambulatory Visit
Admission: RE | Admit: 2023-05-08 | Discharge: 2023-05-08 | Disposition: A | Discharge status: Home / Self Care | Payer: Medicare Other | Source: Ambulatory Visit | Attending: Physician Assistant | Admitting: Physician Assistant

## 2023-05-08 DIAGNOSIS — R3129 Other microscopic hematuria: Secondary | ICD-10-CM | POA: Insufficient documentation

## 2023-05-08 MED ORDER — IOHEXOL 300 MG/ML  SOLN
100.0000 mL | Freq: Once | INTRAMUSCULAR | Status: AC | PRN
  Administered 2023-05-08: 100 mL via INTRAVENOUS

## 2023-05-14 ENCOUNTER — Emergency Department: Payer: Medicare Other

## 2023-05-14 ENCOUNTER — Emergency Department
Admission: EM | Admit: 2023-05-14 | Discharge: 2023-05-14 | Disposition: A | Discharge status: Home / Self Care | Payer: Medicare Other | Attending: Emergency Medicine | Admitting: Emergency Medicine

## 2023-05-14 DIAGNOSIS — K5730 Diverticulosis of large intestine without perforation or abscess without bleeding: Secondary | ICD-10-CM | POA: Diagnosis not present

## 2023-05-14 DIAGNOSIS — R109 Unspecified abdominal pain: Secondary | ICD-10-CM | POA: Diagnosis present

## 2023-05-14 LAB — CBC
HCT: 39.4 % (ref 36.0–46.0)
Hemoglobin: 12.8 g/dL (ref 12.0–15.0)
MCH: 29 pg (ref 26.0–34.0)
MCHC: 32.5 g/dL (ref 30.0–36.0)
MCV: 89.1 fL (ref 80.0–100.0)
Platelets: 374 10*3/uL (ref 150–400)
RBC: 4.42 MIL/uL (ref 3.87–5.11)
RDW: 13.6 % (ref 11.5–15.5)
WBC: 12 10*3/uL — ABNORMAL HIGH (ref 4.0–10.5)
nRBC: 0 % (ref 0.0–0.2)

## 2023-05-14 LAB — URINALYSIS, ROUTINE W REFLEX MICROSCOPIC
Bilirubin Urine: NEGATIVE
Glucose, UA: NEGATIVE mg/dL
Ketones, ur: NEGATIVE mg/dL
Nitrite: NEGATIVE
Protein, ur: NEGATIVE mg/dL
Specific Gravity, Urine: 1.008 (ref 1.005–1.030)
pH: 5 (ref 5.0–8.0)

## 2023-05-14 LAB — COMPREHENSIVE METABOLIC PANEL
ALT: 14 U/L (ref 0–44)
AST: 23 U/L (ref 15–41)
Albumin: 3.7 g/dL (ref 3.5–5.0)
Alkaline Phosphatase: 49 U/L (ref 38–126)
Anion gap: 12 (ref 5–15)
BUN: 15 mg/dL (ref 8–23)
CO2: 24 mmol/L (ref 22–32)
Calcium: 9.1 mg/dL (ref 8.9–10.3)
Chloride: 100 mmol/L (ref 98–111)
Creatinine, Ser: 0.76 mg/dL (ref 0.44–1.00)
GFR, Estimated: >60 mL/min (ref >60)
Glucose, Bld: 182 mg/dL — ABNORMAL HIGH (ref 70–99)
Potassium: 4.6 mmol/L (ref 3.5–5.1)
Sodium: 136 mmol/L (ref 135–145)
Total Bilirubin: 1 mg/dL (ref 0.0–1.2)
Total Protein: 7.2 g/dL (ref 6.5–8.1)

## 2023-05-14 LAB — LIPASE, BLOOD: Lipase: 29 U/L (ref 11–51)

## 2023-05-14 MED ORDER — IOHEXOL 300 MG/ML  SOLN
100.0000 mL | Freq: Once | INTRAMUSCULAR | Status: AC | PRN
  Administered 2023-05-14: 100 mL via INTRAVENOUS

## 2023-05-14 MED ORDER — AMOXICILLIN-POT CLAVULANATE 875-125 MG PO TABS: 1.0000 | ORAL_TABLET | Freq: Two times a day (BID) | ORAL | 0 refills | Status: AC

## 2023-05-14 MED ORDER — ONDANSETRON 8 MG PO TBDP: 8.0000 mg | ORAL_TABLET | Freq: Three times a day (TID) | ORAL | 0 refills | Status: AC | PRN

## 2023-05-14 NOTE — ED Notes (Signed)
 Blood work collected at this time.

## 2023-05-14 NOTE — ED Notes (Signed)
 Pt instructed on needed a urine sample. Urine cup set at bedside. Pt stated understanding.

## 2023-05-14 NOTE — ED Triage Notes (Signed)
 Pt c/o abdominal pain/bloating "from the waist down" and nausea x several weeks increasing x3 days.  Currently denies pain.  Pt reports pain increases w/ BMs.  Sts BMs "are incomplete." Hx of IBS and diverticulitis.

## 2023-05-14 NOTE — ED Provider Notes (Signed)
 [facility] Provider Note   Event Date/Time   First MD Initiated Contact with Patient 05/14/23 [phone]     (approximate) History  Abdominal Pain and Nausea  HPI Olivia Alvarado is a 73 y.o. female with stated past medical history of diverticulosis who presents complaining of bilateral lower abdominal pain with associated diarrhea and vomiting that began today.  Patient states that she feels a constant pressure and the inability to fully relieve her bowels.  Husband at bedside states that the symptoms have been present over the last few months however she has not had this bad abdominal pain and pressure. ROS: Patient currently denies any vision changes, tinnitus, difficulty speaking, facial droop, sore throat, chest pain, shortness of breath, dysuria, or weakness/numbness/paresthesias in any extremity   Physical Exam  Triage Vital Signs: ED Triage Vitals  Encounter Vitals Group     BP 05/14/23 0910 (!) 141/73     Systolic BP Percentile --      Diastolic BP Percentile --      Pulse Rate 05/14/23 0910 94     Resp 05/14/23 0910 18     Temp 05/14/23 0910 98.3 F (36.8 C)     Temp Source 05/14/23 0910 Oral     SpO2 05/14/23 0910 95 %     Weight 05/14/23 0911 138 lb (62.6 kg)     Height 05/14/23 0911 5' 2 (1.575 m)     Head Circumference --      Peak Flow --      Pain Score 05/14/23 0911 0     Pain Loc --      Pain Education --      Exclude from Growth Chart --    Most recent vital signs: Vitals:   05/14/23 1230 05/14/23 1300  BP: 115/67 103/62  Pulse: 72 73  Resp: 17   Temp:    SpO2: 98% 96%   General: Awake, oriented x4. CV:  Good peripheral perfusion.  Resp:  Normal effort.  Abd:  No distention.  Left lower quadrant tenderness to palpation Other:  Elderly well-developed, well-nourished Caucasian female resting comfortably in no acute distress ED Results / Procedures / Treatments  Labs (all labs ordered are listed, but only abnormal results are  displayed) Labs Reviewed  COMPREHENSIVE METABOLIC PANEL - Abnormal; Notable for the following components:      Result Value   Glucose, Bld 182 (*)    All other components within normal limits  CBC - Abnormal; Notable for the following components:   WBC 12.0 (*)    All other components within normal limits  URINALYSIS, ROUTINE W REFLEX MICROSCOPIC - Abnormal; Notable for the following components:   Color, Urine YELLOW (*)    APPearance HAZY (*)    Hgb urine dipstick SMALL (*)    Leukocytes,Ua MODERATE (*)    Bacteria, UA RARE (*)    All other components within normal limits  LIPASE, BLOOD  RADIOLOGY ED MD interpretation: CT of the abdomen pelvis with IV contrast interpreted independently and shows acute uncomplicated diverticulitis at the junction of the sigmoid colon and rectum.  There is no evidence of pericolonic free air or abscess -Agree with radiology assessment Official radiology report(s): CT ABDOMEN PELVIS W CONTRAST Result Date: 05/14/2023 CLINICAL DATA:  Diverticulitis, complication suspected. Abdominal pain and bloating for several weeks, worsening. EXAM: CT ABDOMEN AND PELVIS WITH CONTRAST TECHNIQUE: Multidetector CT imaging of the abdomen and pelvis was performed using the standard protocol following bolus administration of  intravenous contrast. RADIATION DOSE REDUCTION: This exam was performed according to the departmental dose-optimization program which includes automated exposure control, adjustment of the mA and/or kV according to patient size and/or use of iterative reconstruction technique. CONTRAST:  OMNIPAQUE  IOHEXOL  300 MG/ML  SOLN COMPARISON:  05/08/2023 and 06/27/2022 CT abdomen/pelvis FINDINGS: Lower chest: No significant pulmonary nodules or acute consolidative airspace disease. Hepatobiliary: Normal liver size. Subcentimeter hypodense posterior right liver lesion is too small to characterize and is unchanged, considered benign. No new liver lesions. Normal  gallbladder with no radiopaque cholelithiasis. No biliary ductal dilatation. Pancreas: Normal, with no mass or duct dilation. Spleen: Normal size. No mass. Adrenals/Urinary Tract: Normal adrenals. No hydronephrosis. Scattered subcentimeter hypodense left renal cortical lesions are too small to characterize and unchanged, considered benign, for which no follow-up imaging is recommended. Normal bladder. Stomach/Bowel: Small hiatal hernia. Otherwise normal nondistended stomach. Normal caliber small bowel with no small bowel wall thickening. Appendix not discretely visualized. No pericecal inflammatory changes. Marked left colonic diverticulosis. Prominent segmental colonic wall thickening at the junction of the sigmoid colon and rectum with associated pericolonic fat stranding, new from 05/08/2023 CT, compatible with acute diverticulitis. No pericolonic free air or abscess. Vascular/Lymphatic: Atherosclerotic nonaneurysmal abdominal aorta. Patent portal, splenic, hepatic and renal veins. No pathologically enlarged lymph nodes in the abdomen or pelvis. Reproductive: Coarsely calcified degenerated 1.8 cm right upper uterine fibroid, unchanged. No adnexal masses. Other: No pneumoperitoneum, ascites or focal fluid collection. Musculoskeletal: No aggressive appearing focal osseous lesions. Chronic mild T12 vertebral compression fracture status post vertebroplasty. Moderate lumbar spondylosis. IMPRESSION: 1. Acute uncomplicated diverticulitis at the junction of the sigmoid colon and rectum, new from recent 05/08/2023 CT. No pericolonic free air or abscess. Underlying severe left colonic diverticulosis. 2. Small hiatal hernia. 3. Small calcified degenerated uterine fibroid. 4.  Aortic Atherosclerosis (ICD10-I70.0). Electronically Signed   By: Selinda DELENA Blue M.D.   On: 05/14/2023 13:37   PROCEDURES: Critical Care performed: No Procedures MEDICATIONS ORDERED IN ED: Medications  iohexol  (OMNIPAQUE ) 300 MG/ML solution 100 mL  (100 mLs Intravenous Contrast Given 05/14/23 1312)   IMPRESSION / MDM / ASSESSMENT AND PLAN / ED COURSE  I reviewed the triage vital signs and the nursing notes.                             The patient is on the cardiac monitor to evaluate for evidence of arrhythmia and/or significant heart rate changes. Patient's presentation is most consistent with acute presentation with potential threat to life or bodily function. Patient has diverticulitis that is amenable to oral antibiotics. Patient has no peritoneal signs or signs of perforation. Patient's symptoms not typical for other emergent causes of abdominal pain such as, but not limited to, appendicitis, abdominal aortic aneurysm, surgical biliary disease, acute coronary syndrome, etc.  Patient will be discharged with strict return precautions and follow up with primary MD within 12-24 hours for further evaluation.  Patient understands that they may require admission and IV antibiotics and possibly surgery if they do not improve with oral antibiotics.   FINAL CLINICAL IMPRESSION(S) / ED DIAGNOSES   Final diagnoses:  Diverticulosis of colon   Rx / DC Orders   ED Discharge Orders          Ordered    amoxicillin -clavulanate (AUGMENTIN ) 875-125 MG tablet  2 times daily        05/14/23 1436    ondansetron  (ZOFRAN -ODT) 8 MG disintegrating tablet  Every  8 hours PRN        05/14/23 1436           Note:  This document was prepared using Dragon voice recognition software and may include unintentional dictation errors.   Jossie Artist POUR, MD 05/14/23 [phone]

## 2023-05-23 ENCOUNTER — Ambulatory Visit (INDEPENDENT_AMBULATORY_CARE_PROVIDER_SITE_OTHER): Payer: Medicare Other | Admitting: Urology

## 2023-05-23 VITALS — BP 134/79 | HR 93 | Ht 62.0 in

## 2023-05-23 DIAGNOSIS — R3129 Other microscopic hematuria: Secondary | ICD-10-CM

## 2023-05-23 DIAGNOSIS — R350 Frequency of micturition: Secondary | ICD-10-CM

## 2023-05-23 MED ORDER — LIDOCAINE HCL URETHRAL/MUCOSAL 2 % EX GEL
1.0000 | Freq: Once | CUTANEOUS | Status: AC
Start: 2023-05-23 — End: 2023-05-23
  Administered 2023-05-23: 1 via URETHRAL

## 2023-05-23 NOTE — Progress Notes (Signed)
 Cystoscopy Procedure Note:  Indication: Microscopic hematuria, pelvic pressure  After informed consent and discussion of the procedure and its risks, Olivia Alvarado was positioned and prepped in the standard fashion. Cystoscopy was performed with a flexible cystoscope. The urethra, bladder neck and entire bladder was visualized in a standard fashion. The ureteral orifices were visualized in their normal location and orientation.  Bladder mucosa grossly normal throughout, no abnormalities on retroflexion  Imaging: CT urogram benign, follow-up CT 1 week later for abdominal pain showed diverticulitis  Findings: Normal cystoscopy  Assessment and Plan: Her pelvic pressure has improved after starting antibiotics for diverticulitis, may have a component of IC in conjunction with her baseline IBS.  Has urinary frequency likely secondary to high volume intake of caffeine, we reviewed behavioral strategies and avoiding bladder irritants.  She is not bothered enough at this time to consider OAB medications  Follow-up with PA as needed if recurrent urinary symptoms Two negative microscopic hematuria workups in the last 10 years, can likely defer further workup  Legrand Rams, MD 05/23/2023

## 2023-05-23 NOTE — Patient Instructions (Signed)
 Making a list of foods that you think trigger your symptoms. You may also want to include foods that often cause symptoms in other people with IC. It may take a few months to find out which foods bother you. Taking those foods out of your diet for about a month. After that month, you can try to have the foods again one at a time to see which ones cause symptoms. Reading food labels Once you know which foods trigger your symptoms, you can avoid them. But it's still a good idea to read food labels. Some foods that cause your symptoms may be ingredients in other foods. These foods may include: Soy. Worcestershire sauce. Vinegar. Alcohol. Artificial sweeteners. Monosodium glutamate. Other triggers may include: Chili peppers. Tomato products. Citrus fruits, flavors, or juices. Shopping Shopping can be hard if many foods trigger your IC. Bring a list of the foods you can't eat with you when you go to the store. You can get an app for your phone that lets you know which foods are safer and which ones you may want to avoid. You can find the app at the [employer] website: ic-network.com Meal planning Plan your meals based on the results of your elimination diet. If you haven't done the diet yet, plan meals based on IC food lists. These lists may be given to you by your health care provider or dietitian. The lists tell you which foods are least and most likely to cause symptoms. Avoid certain types of food when you go out to eat. These may include: Pizza. [place] food. [place] food. [place] food. General information Do not eat large portions. Drink lots of fluids with your meals. Do not eat foods that are high in sugar, salt, or saturated fat. Choose whole fruits instead of juice. Eat a colorful variety of vegetables. Find ways to manage stress. Get enough exercise. What foods should I eat? For people with IC, the best diet is a balanced one. This means it includes things from all the food  groups. Even if you have to avoid certain foods, there are still lots of healthy choices in each group. Below are some foods that may be safest for you to eat: Fruits Bananas. Blueberries and blueberry juice. Melons. Pears. Apples. Dates. Prunes. Raisins. Apricots. Vegetables Asparagus. Avocado. Celery. Beets. Bell peppers. Black olives. Broccoli. Brussels sprouts. Cabbage. Carrots. Cauliflower. Cucumber. Eggplant. Green beans. Potatoes. Radishes. Spinach. Squash. Turnips. Zucchini. Mushrooms. Peas. Grains Oats. Rice. Bran. Oatmeal. Whole wheat bread. Meats and other proteins Beef. Fish and other seafood. Eggs. Nuts. Peanut butter. Pork. Poultry. Lamb. Garbanzo beans. Pinto beans. Dairy Whole or low-fat milk. American, mozzarella, mild cheddar, feta, ricotta, and cream cheeses. The items listed above may not be all the foods and drinks you can have. Talk to a dietitian to learn more. What foods should I avoid? You should avoid any foods that cause symptoms. It's also a good idea to avoid foods that often cause symptoms in many people with IC. These foods include: Fruits Citrus fruits, such as lemons, limes, oranges, and grapefruit. Cranberries. Strawberries. Pineapple. Kiwi. Vegetables Chili peppers. Onions. Sauerkraut. Tomato and tomato products. Rosita Fire. Grains You don't need to avoid any type of grain unless it causes symptoms. Meats and other proteins Precooked or cured meats, such as sausages or meat loaves. Soy products. Dairy Chocolate ice cream. Processed cheese. Yogurt. Drinks Alcohol. Chocolate drinks. Coffee. Cranberry juice. Fizzy drinks. Black, green, or herbal tea. Tomato juice. Sports drinks. The items listed above may not  be all the foods and drinks you should avoid. Talk to a dietitian to learn more. This information is not intended to replace advice given to you by your health care provider. Make sure you discuss any questions you have with your health care  provider. Document Revised: 06/30/2022 Document Reviewed: 06/30/2022 Elsevier Patient Education  2024 [employer].

## 2023-11-16 ENCOUNTER — Telehealth: Payer: Self-pay | Admitting: Gastroenterology

## 2023-11-16 NOTE — Telephone Encounter (Signed)
 Good afternoon Dr. San,     DOD of the PM,    I received a call from this patient requesting to transfer her care over to us  due to her not being happy with her current GI providers. Patient stated that she would to be seen for her IBS and Diverticulitis. Patient is not sure which one she has or could be affecting her. Patient had a colonoscopy 1 year ago and both her colonoscopy and pathology report are in EPIC ready for you to review them. Would you please advise on how to schedule this patient.   Thank you.

## 2023-11-24 ENCOUNTER — Emergency Department: Admission: EM | Admit: 2023-11-24 | Discharge: 2023-11-24 | Disposition: A | Discharge status: Home / Self Care

## 2023-11-24 ENCOUNTER — Ambulatory Visit: Payer: Self-pay | Admitting: Cardiology

## 2023-11-24 ENCOUNTER — Other Ambulatory Visit: Payer: Self-pay

## 2023-11-24 DIAGNOSIS — R1032 Left lower quadrant pain: Secondary | ICD-10-CM | POA: Insufficient documentation

## 2023-11-24 DIAGNOSIS — I10 Essential (primary) hypertension: Secondary | ICD-10-CM | POA: Diagnosis not present

## 2023-11-24 DIAGNOSIS — E119 Type 2 diabetes mellitus without complications: Secondary | ICD-10-CM | POA: Diagnosis not present

## 2023-11-24 DIAGNOSIS — R197 Diarrhea, unspecified: Secondary | ICD-10-CM | POA: Diagnosis present

## 2023-11-24 DIAGNOSIS — R110 Nausea: Secondary | ICD-10-CM | POA: Insufficient documentation

## 2023-11-24 LAB — CBC
HCT: 40.4 % (ref 36.0–46.0)
Hemoglobin: 12.6 g/dL (ref 12.0–15.0)
MCH: 28.3 pg (ref 26.0–34.0)
MCHC: 31.2 g/dL (ref 30.0–36.0)
MCV: 90.8 fL (ref 80.0–100.0)
Platelets: 419 K/uL — ABNORMAL HIGH (ref 150–400)
RBC: 4.45 MIL/uL (ref 3.87–5.11)
RDW: 13.4 % (ref 11.5–15.5)
WBC: 6.5 K/uL (ref 4.0–10.5)
nRBC: 0 % (ref 0.0–0.2)

## 2023-11-24 LAB — URINALYSIS, ROUTINE W REFLEX MICROSCOPIC
Bilirubin Urine: NEGATIVE
Glucose, UA: NEGATIVE mg/dL
Hgb urine dipstick: NEGATIVE
Ketones, ur: NEGATIVE mg/dL
Nitrite: NEGATIVE
Protein, ur: NEGATIVE mg/dL
Specific Gravity, Urine: 1.015 (ref 1.005–1.030)
pH: 5 (ref 5.0–8.0)

## 2023-11-24 LAB — COMPREHENSIVE METABOLIC PANEL WITH GFR
ALT: 12 U/L (ref 0–44)
AST: 14 U/L — ABNORMAL LOW (ref 15–41)
Albumin: 4 g/dL (ref 3.5–5.0)
Alkaline Phosphatase: 59 U/L (ref 38–126)
Anion gap: 9 (ref 5–15)
BUN: 16 mg/dL (ref 8–23)
CO2: 26 mmol/L (ref 22–32)
Calcium: 9.2 mg/dL (ref 8.9–10.3)
Chloride: 104 mmol/L (ref 98–111)
Creatinine, Ser: 0.59 mg/dL (ref 0.44–1.00)
GFR, Estimated: >60 mL/min (ref >60)
Glucose, Bld: 139 mg/dL — ABNORMAL HIGH (ref 70–99)
Potassium: 4.3 mmol/L (ref 3.5–5.1)
Sodium: 139 mmol/L (ref 135–145)
Total Bilirubin: 0.7 mg/dL (ref 0.0–1.2)
Total Protein: 7.2 g/dL (ref 6.5–8.1)

## 2023-11-24 LAB — LIPASE, BLOOD: Lipase: 33 U/L (ref 11–51)

## 2023-11-24 MED ORDER — AMOXICILLIN-POT CLAVULANATE 875-125 MG PO TABS: 1.0000 | ORAL_TABLET | Freq: Three times a day (TID) | ORAL | 0 refills | Status: AC

## 2023-11-24 MED ORDER — METOCLOPRAMIDE HCL 10 MG PO TABS: 10.0000 mg | ORAL_TABLET | Freq: Three times a day (TID) | ORAL | 0 refills | Status: AC

## 2023-11-24 NOTE — ED Notes (Signed)
 See triage note  Presents with some intermittent diarrhea with nausea and some wt loss  States this has been going on for a while   Was going to GI doctor  but now waiting to get n appt with new one

## 2023-11-24 NOTE — ED Triage Notes (Signed)
 Pt to ED via POV from home. Pt reports has been feeling nauseas and worse after eating. Pt reports intermittent diarrhea x2 wks. Pt with hx of IBS and diverticulosis. Denies fevers.

## 2023-11-24 NOTE — Discharge Instructions (Addendum)
 Your evaluation in the emergency department was overall reassuring.  I do suspect you may have mild recurrent diverticulitis, and I have started you on a course of antibiotics--these may also help with your diarrhea.  I also prescribed nausea medication that you can use in addition to the ondansetron  you are already taking.  As discussed, I also placed a referral for the GI clinic in [place]--they will contact you to schedule an appointment.  Return to the emergency department with any new or worsening symptoms.

## 2023-11-24 NOTE — ED Provider Notes (Signed)
 [facility] Provider Note    Event Date/Time   First MD Initiated Contact with Patient 11/24/23 1547     (approximate)   History   Diarrhea  Pt to ED via POV from home. Pt reports has been feeling nauseas and worse after eating. Pt reports intermittent diarrhea x2 wks. Pt with hx of IBS and diverticulosis. Denies fevers.    HPI Olivia Alvarado is a 73 y.o. female PMH IBS, T2DM, hypertension, diverticulosis/diverticulitis presents for evaluation of diarrhea -Patient tells me she has been having diarrhea for about 1 month.  Also some intermittent nausea.  Diarrhea is nonbloody, none watery.  No preceding antibiotics or travel.  No significant abdominal pain though does occasionally have left lower quadrant and suprapubic discomfort marcie. -Presents today because she has not been able to get in with a GI doctor.  Has been seen in clinic in [facility] but was frustrated that she was seeing an APP instead of the physician so decided she want to switch to a clinic in [place], has been told the wait would be 2 to 3 weeks.  Comes to emergency department due to this.  No interval change in symptoms that she has had for the past month. -Does have some decreased p.o. intake in the setting of her nausea.  1 episode of vomiting about a month, not recent. -No urinary symptoms     Physical Exam   Triage Vital Signs: ED Triage Vitals  Encounter Vitals Group     BP 11/24/23 1206 (!) 148/82     Girls Systolic BP Percentile --      Girls Diastolic BP Percentile --      Boys Systolic BP Percentile --      Boys Diastolic BP Percentile --      Pulse Rate 11/24/23 1206 85     Resp 11/24/23 1206 18     Temp 11/24/23 1206 98.9 F (37.2 C)     Temp Source 11/24/23 1206 Oral     SpO2 11/24/23 1206 100 %     Weight 11/24/23 1544 138 lb 0.1 oz (62.6 kg)     Height 11/24/23 1544 5' 2 (1.575 m)     Head Circumference --      Peak Flow --      Pain Score 11/24/23 1207 0      Pain Loc --      Pain Education --      Exclude from Growth Chart --     Most recent vital signs: Vitals:   11/24/23 1206 11/24/23 1617  BP: (!) 148/82 (!) 140/80  Pulse: 85 80  Resp: 18 16  Temp: 98.9 F (37.2 C) 98 F (36.7 C)  SpO2: 100% 100%     General: Awake, no distress.  CV:  Good peripheral perfusion. RRR, RP 2+ Resp:  Normal effort. CTAB Abd:  No distention.  Trace tenderness in left lower quadrant, no tenderness elsewhere throughout abdomen.  No CVA tenderness.    ED Results / Procedures / Treatments   Labs (all labs ordered are listed, but only abnormal results are displayed) Labs Reviewed  COMPREHENSIVE METABOLIC PANEL WITH GFR - Abnormal; Notable for the following components:      Result Value   Glucose, Bld 139 (*)    AST 14 (*)    All other components within normal limits  CBC - Abnormal; Notable for the following components:   Platelets 419 (*)    All other components within normal limits  URINALYSIS, ROUTINE W REFLEX MICROSCOPIC - Abnormal; Notable for the following components:   Color, Urine YELLOW (*)    APPearance CLOUDY (*)    Leukocytes,Ua LARGE (*)    Bacteria, UA MANY (*)    All other components within normal limits  LIPASE, BLOOD     EKG  N/a   RADIOLOGY N/a    PROCEDURES:  Critical Care performed: No  Procedures   MEDICATIONS ORDERED IN ED: Medications - No data to display   IMPRESSION / MDM / ASSESSMENT AND PLAN / ED COURSE  I reviewed the triage vital signs and the nursing notes.                              DDX/MDM/AP: Differential diagnosis includes, but is not limited to, colitis, flare of inflammatory bowel syndrome, do suspect some mild recurrent diverticulitis given focal tenderness in left lower quadrant though exam is overall very reassuring.  Clinically doubt C. difficile given description of diarrhea and clinical history.  Consider underlying UTI.  Plan: - Labs - No clear indication for emergent  imaging though given age did discuss with patient--in shared decision making, she prefers to defer which I believe is very reasonable  Patient's presentation is most consistent with acute complicated illness / injury requiring diagnostic workup.   ED course below.  Laboratory workup unremarkable--no leukocytosis, LFTs unremarkable, no significant electrolyte abnormalities.  Urinalysis equivocal.  In shared decision making, will treat for presumed mild diverticulitis with course of Augmentin , I anticipate this may also help with diarrhea.  Did place referral to GI referral, may help expedite patient's follow-up.  Is already taking ondansetron  at home, prescribed Reglan  to use in addition to as needed.  ED return precautions in place.  Patient agrees with plan.  Clinical Course as of 11/24/23 1712  Fri Nov 24, 2023  1631 CBC, CMP reviewed, unremarkable Lipase normal Urinalysis equivocal, contaminated [MM]    Clinical Course User Index [MM] Clarine Ozell LABOR, MD     FINAL CLINICAL IMPRESSION(S) / ED DIAGNOSES   Final diagnoses:  Diarrhea, unspecified type  Nausea     Rx / DC Orders   ED Discharge Orders          Ordered    amoxicillin -clavulanate (AUGMENTIN ) 875-125 MG tablet  3 times daily        11/24/23 1657    Ambulatory referral to Gastroenterology        11/24/23 1657    metoCLOPramide  (REGLAN ) 10 MG tablet  3 times daily with meals        11/24/23 1657             Note:  This document was prepared using Dragon voice recognition software and may include unintentional dictation errors.   Clarine Ozell LABOR, MD 11/24/23 [phone]

## 2024-02-22 ENCOUNTER — Other Ambulatory Visit: Payer: Self-pay | Admitting: Family Medicine

## 2024-02-22 DIAGNOSIS — Z1231 Encounter for screening mammogram for malignant neoplasm of breast: Secondary | ICD-10-CM

## 2024-03-26 ENCOUNTER — Ambulatory Visit
Admission: RE | Admit: 2024-03-26 | Discharge: 2024-03-26 | Disposition: A | Discharge status: Home / Self Care | Source: Ambulatory Visit | Attending: Family Medicine | Admitting: Family Medicine

## 2024-03-26 DIAGNOSIS — Z1231 Encounter for screening mammogram for malignant neoplasm of breast: Secondary | ICD-10-CM | POA: Insufficient documentation
